# Patient Record
Sex: Female | Born: 1940
Health system: Southern US, Community
[De-identification: ages and names within clinical notes are randomized; demographics above are authoritative.]

## PROBLEM LIST (undated history)

## (undated) DIAGNOSIS — K635 Polyp of colon: Secondary | ICD-10-CM

## (undated) DIAGNOSIS — Z1339 Encounter for screening examination for other mental health and behavioral disorders: Secondary | ICD-10-CM

## (undated) DIAGNOSIS — E663 Overweight: Secondary | ICD-10-CM

## (undated) DIAGNOSIS — M25551 Pain in right hip: Secondary | ICD-10-CM

## (undated) DIAGNOSIS — R1907 Generalized intra-abdominal and pelvic swelling, mass and lump: Secondary | ICD-10-CM

## (undated) DIAGNOSIS — R634 Abnormal weight loss: Secondary | ICD-10-CM

## (undated) DIAGNOSIS — M160 Bilateral primary osteoarthritis of hip: Secondary | ICD-10-CM

## (undated) DIAGNOSIS — E782 Mixed hyperlipidemia: Secondary | ICD-10-CM

## (undated) DIAGNOSIS — Z114 Encounter for screening for human immunodeficiency virus [HIV]: Secondary | ICD-10-CM

## (undated) DIAGNOSIS — R0602 Shortness of breath: Secondary | ICD-10-CM

## (undated) DIAGNOSIS — Z780 Asymptomatic menopausal state: Secondary | ICD-10-CM

## (undated) DIAGNOSIS — M5431 Sciatica, right side: Secondary | ICD-10-CM

## (undated) DIAGNOSIS — Z79899 Other long term (current) drug therapy: Secondary | ICD-10-CM

## (undated) DIAGNOSIS — I6523 Occlusion and stenosis of bilateral carotid arteries: Secondary | ICD-10-CM

## (undated) DIAGNOSIS — J069 Acute upper respiratory infection, unspecified: Secondary | ICD-10-CM

## (undated) DIAGNOSIS — M47816 Spondylosis without myelopathy or radiculopathy, lumbar region: Secondary | ICD-10-CM

## (undated) DIAGNOSIS — R42 Dizziness and giddiness: Secondary | ICD-10-CM

## (undated) DIAGNOSIS — J019 Acute sinusitis, unspecified: Secondary | ICD-10-CM

## (undated) DIAGNOSIS — R3129 Other microscopic hematuria: Secondary | ICD-10-CM

## (undated) DIAGNOSIS — R7989 Other specified abnormal findings of blood chemistry: Secondary | ICD-10-CM

## (undated) DIAGNOSIS — Z2821 Immunization not carried out because of patient refusal: Secondary | ICD-10-CM

## (undated) DIAGNOSIS — Z09 Encounter for follow-up examination after completed treatment for conditions other than malignant neoplasm: Secondary | ICD-10-CM

## (undated) DIAGNOSIS — Z135 Encounter for screening for eye and ear disorders: Secondary | ICD-10-CM

## (undated) DIAGNOSIS — F0390 Unspecified dementia without behavioral disturbance: Secondary | ICD-10-CM

## (undated) DIAGNOSIS — I517 Cardiomegaly: Secondary | ICD-10-CM

## (undated) DIAGNOSIS — Z6822 Body mass index (BMI) 22.0-22.9, adult: Secondary | ICD-10-CM

## (undated) DIAGNOSIS — N390 Urinary tract infection, site not specified: Secondary | ICD-10-CM

## (undated) DIAGNOSIS — M722 Plantar fascial fibromatosis: Secondary | ICD-10-CM

## (undated) DIAGNOSIS — S3210XD Unspecified fracture of sacrum, subsequent encounter for fracture with routine healing: Secondary | ICD-10-CM

## (undated) DIAGNOSIS — R945 Abnormal results of liver function studies: Secondary | ICD-10-CM

## (undated) DIAGNOSIS — E559 Vitamin D deficiency, unspecified: Secondary | ICD-10-CM

## (undated) DIAGNOSIS — Z2829 Immunization not carried out because of patient decision for other reason: Secondary | ICD-10-CM

## (undated) DIAGNOSIS — E781 Pure hyperglyceridemia: Secondary | ICD-10-CM

## (undated) DIAGNOSIS — Z1231 Encounter for screening mammogram for malignant neoplasm of breast: Secondary | ICD-10-CM

## (undated) DIAGNOSIS — R160 Hepatomegaly, not elsewhere classified: Secondary | ICD-10-CM

## (undated) DIAGNOSIS — R9389 Abnormal findings on diagnostic imaging of other specified body structures: Secondary | ICD-10-CM

## (undated) DIAGNOSIS — D539 Nutritional anemia, unspecified: Secondary | ICD-10-CM

## (undated) DIAGNOSIS — Z91199 Patient's noncompliance with other medical treatment and regimen due to unspecified reason: Secondary | ICD-10-CM

## (undated) DIAGNOSIS — Z113 Encounter for screening for infections with a predominantly sexual mode of transmission: Secondary | ICD-10-CM

## (undated) DIAGNOSIS — N281 Cyst of kidney, acquired: Secondary | ICD-10-CM

## (undated) DIAGNOSIS — Z87891 Personal history of nicotine dependence: Secondary | ICD-10-CM

## (undated) DIAGNOSIS — R350 Frequency of micturition: Secondary | ICD-10-CM

## (undated) DIAGNOSIS — M4310 Spondylolisthesis, site unspecified: Secondary | ICD-10-CM

## (undated) DIAGNOSIS — L0390 Cellulitis, unspecified: Secondary | ICD-10-CM

## (undated) DIAGNOSIS — S322XXD Fracture of coccyx, subsequent encounter for fracture with routine healing: Secondary | ICD-10-CM

## (undated) DIAGNOSIS — M545 Low back pain, unspecified: Secondary | ICD-10-CM

## (undated) DIAGNOSIS — G319 Degenerative disease of nervous system, unspecified: Secondary | ICD-10-CM

## (undated) DIAGNOSIS — N183 Chronic kidney disease, stage 3 unspecified: Secondary | ICD-10-CM

## (undated) DIAGNOSIS — Z9119 Patient's noncompliance with other medical treatment and regimen: Secondary | ICD-10-CM

## (undated) DIAGNOSIS — M5116 Intervertebral disc disorders with radiculopathy, lumbar region: Secondary | ICD-10-CM

## (undated) DIAGNOSIS — Z1331 Encounter for screening for depression: Secondary | ICD-10-CM

## (undated) DIAGNOSIS — E612 Magnesium deficiency: Secondary | ICD-10-CM

## (undated) DIAGNOSIS — Z1211 Encounter for screening for malignant neoplasm of colon: Secondary | ICD-10-CM

## (undated) DIAGNOSIS — M129 Arthropathy, unspecified: Secondary | ICD-10-CM

## (undated) DIAGNOSIS — R911 Solitary pulmonary nodule: Secondary | ICD-10-CM

## (undated) DIAGNOSIS — A499 Bacterial infection, unspecified: Secondary | ICD-10-CM

## (undated) DIAGNOSIS — I10 Essential (primary) hypertension: Secondary | ICD-10-CM

## (undated) DIAGNOSIS — K8689 Other specified diseases of pancreas: Secondary | ICD-10-CM

## (undated) DIAGNOSIS — R630 Anorexia: Secondary | ICD-10-CM

## (undated) HISTORY — DX: Intervertebral disc disorders with radiculopathy, lumbar region: M51.16

## (undated) HISTORY — DX: Abnormal results of liver function studies: R94.5

## (undated) HISTORY — DX: Personal history of nicotine dependence: Z87.891

## (undated) HISTORY — DX: Other specified abnormal findings of blood chemistry: R79.89

## (undated) HISTORY — DX: Anorexia: R63.0

## (undated) HISTORY — DX: Spondylolisthesis, site unspecified: M43.10

## (undated) HISTORY — DX: Cyst of kidney, acquired: N28.1

## (undated) HISTORY — DX: Pain in right hip: M25.551

## (undated) HISTORY — DX: Encounter for screening for infections with a predominantly sexual mode of transmission: Z11.3

## (undated) HISTORY — DX: Immunization not carried out because of patient refusal: Z28.21

## (undated) HISTORY — DX: Encounter for screening for human immunodeficiency virus (HIV): Z11.4

## (undated) HISTORY — DX: Encounter for follow-up examination after completed treatment for conditions other than malignant neoplasm: Z09

## (undated) HISTORY — DX: Acute upper respiratory infection, unspecified: J06.9

## (undated) HISTORY — DX: Magnesium deficiency: E61.2

## (undated) HISTORY — DX: Sciatica, right side: M54.31

## (undated) HISTORY — DX: Immunization not carried out because of patient decision for other reason: Z28.29

## (undated) HISTORY — DX: Other long term (current) drug therapy: Z79.899

## (undated) HISTORY — DX: Polyp of colon: K63.5

## (undated) HISTORY — DX: Encounter for screening mammogram for malignant neoplasm of breast: Z12.31

## (undated) HISTORY — DX: Generalized intra-abdominal and pelvic swelling, mass and lump: R19.07

## (undated) HISTORY — DX: Bilateral primary osteoarthritis of hip: M16.0

## (undated) HISTORY — DX: Frequency of micturition: R35.0

## (undated) HISTORY — DX: Acute sinusitis, unspecified: J01.90

## (undated) HISTORY — DX: Abnormal weight loss: R63.4

## (undated) HISTORY — DX: Other microscopic hematuria: R31.29

## (undated) HISTORY — DX: Nutritional anemia, unspecified: D53.9

## (undated) HISTORY — DX: Pure hyperglyceridemia: E78.1

## (undated) HISTORY — DX: Plantar fascial fibromatosis: M72.2

## (undated) HISTORY — DX: Cardiomegaly: I51.7

## (undated) HISTORY — DX: Bacterial infection, unspecified: N39.0

## (undated) HISTORY — DX: Cellulitis, unspecified: L03.90

## (undated) HISTORY — DX: Other specified diseases of pancreas: K86.89

## (undated) HISTORY — DX: Vitamin D deficiency, unspecified: E55.9

## (undated) HISTORY — DX: Patient's noncompliance with other medical treatment and regimen: Z91.19

## (undated) HISTORY — DX: Spondylosis without myelopathy or radiculopathy, lumbar region: M47.816

## (undated) HISTORY — DX: Solitary pulmonary nodule: R91.1

## (undated) HISTORY — DX: Encounter for screening for malignant neoplasm of colon: Z12.11

## (undated) HISTORY — DX: Low back pain, unspecified: M54.50

## (undated) HISTORY — DX: Abnormal findings on diagnostic imaging of other specified body structures: R93.89

## (undated) HISTORY — DX: Asymptomatic menopausal state: Z78.0

## (undated) HISTORY — DX: Encounter for screening for eye and ear disorders: Z13.5

## (undated) HISTORY — DX: Chronic kidney disease, stage 3 unspecified: N18.30

## (undated) HISTORY — DX: Occlusion and stenosis of bilateral carotid arteries: I65.23

## (undated) HISTORY — DX: Dizziness and giddiness: R42

## (undated) HISTORY — DX: Shortness of breath: R06.02

## (undated) HISTORY — DX: Hepatomegaly, not elsewhere classified: R16.0

## (undated) HISTORY — DX: Fracture of coccyx, subsequent encounter for fracture with routine healing: S32.2XXD

## (undated) HISTORY — DX: Overweight: E66.3

## (undated) HISTORY — DX: Encounter for screening for depression: Z13.31

## (undated) HISTORY — DX: Unspecified fracture of sacrum, subsequent encounter for fracture with routine healing: S32.10XD

## (undated) HISTORY — DX: Arthropathy, unspecified: M12.9

## (undated) HISTORY — DX: Degenerative disease of nervous system, unspecified: G31.9

## (undated) HISTORY — DX: Body mass index (BMI) 22.0-22.9, adult: Z68.22

## (undated) HISTORY — DX: Unspecified dementia, unspecified severity, without behavioral disturbance, psychotic disturbance, mood disturbance, and anxiety: F03.90

## (undated) HISTORY — DX: Mixed hyperlipidemia: E78.2

## (undated) HISTORY — DX: Encounter for screening examination for other mental health and behavioral disorders: Z13.39

## (undated) HISTORY — DX: Bacterial infection, unspecified: A49.9

## (undated) HISTORY — DX: Patient's noncompliance with other medical treatment and regimen due to unspecified reason: Z91.199

---

## 2013-11-07 ENCOUNTER — Encounter: Payer: Self-pay | Admitting: Podiatry

## 2013-11-07 ENCOUNTER — Ambulatory Visit (INDEPENDENT_AMBULATORY_CARE_PROVIDER_SITE_OTHER): Payer: Medicare Other | Admitting: Podiatry

## 2013-11-07 VITALS — BP 199/104 | HR 94 | Ht 66.0 in | Wt 171.0 lb

## 2013-11-07 DIAGNOSIS — M79609 Pain in unspecified limb: Secondary | ICD-10-CM

## 2013-11-07 DIAGNOSIS — M79674 Pain in right toe(s): Secondary | ICD-10-CM

## 2013-11-07 DIAGNOSIS — R609 Edema, unspecified: Secondary | ICD-10-CM

## 2013-11-07 DIAGNOSIS — R600 Localized edema: Secondary | ICD-10-CM | POA: Insufficient documentation

## 2013-11-07 DIAGNOSIS — S99921A Unspecified injury of right foot, initial encounter: Secondary | ICD-10-CM

## 2013-11-07 DIAGNOSIS — S8990XA Unspecified injury of unspecified lower leg, initial encounter: Secondary | ICD-10-CM

## 2013-11-07 NOTE — Progress Notes (Signed)
Subjective: 72 year old presents with painful toe 2nd right. Stated that she hit the toe on a table while walking in dark at home, which happened on December 3rd. The toe is still painful and swollen.   Objective: Swollen 2nd digit right. 2nd digit sits on top of right hallux. HAV with bunion bilateral. Right foot has old incision line over the 1st and 2nd MPJ area. Neurovascular status are within normal. X-ray show no broken bones on the 2nd digit right foot. Has severe hallux valgus with deviated hallux.   Assessment: Injured 2nd digit right with pain and edema. Severe Hallux valgus with bunion bilateral.  Plan: Reviewed clinical findings and available options. Patient is to stay in comfortable shoes for the next 3-4 weeks. Return as needed.

## 2013-11-07 NOTE — Patient Instructions (Addendum)
Seen for pain in right 2nd toe following an injury. X-ray taken.  Noted of no broken bones at the affected toe. Stay in comfortable shoes for the next 3-4 weeks. Return as needed.

## 2014-11-17 DIAGNOSIS — Z9889 Other specified postprocedural states: Secondary | ICD-10-CM

## 2014-11-17 HISTORY — DX: Other specified postprocedural states: Z98.890

## 2017-11-17 DIAGNOSIS — Z9289 Personal history of other medical treatment: Secondary | ICD-10-CM

## 2017-11-17 HISTORY — DX: Personal history of other medical treatment: Z92.89

## 2018-01-07 ENCOUNTER — Emergency Department (HOSPITAL_BASED_OUTPATIENT_CLINIC_OR_DEPARTMENT_OTHER): Payer: Medicare Other

## 2018-01-07 ENCOUNTER — Encounter (HOSPITAL_BASED_OUTPATIENT_CLINIC_OR_DEPARTMENT_OTHER): Payer: Self-pay | Admitting: Emergency Medicine

## 2018-01-07 ENCOUNTER — Emergency Department (HOSPITAL_BASED_OUTPATIENT_CLINIC_OR_DEPARTMENT_OTHER)
Admission: EM | Admit: 2018-01-07 | Discharge: 2018-01-07 | Disposition: A | Discharge status: Home / Self Care | Payer: Medicare Other | Attending: Emergency Medicine | Admitting: Emergency Medicine

## 2018-01-07 ENCOUNTER — Other Ambulatory Visit: Payer: Self-pay

## 2018-01-07 DIAGNOSIS — Y92009 Unspecified place in unspecified non-institutional (private) residence as the place of occurrence of the external cause: Secondary | ICD-10-CM | POA: Insufficient documentation

## 2018-01-07 DIAGNOSIS — Z79899 Other long term (current) drug therapy: Secondary | ICD-10-CM | POA: Insufficient documentation

## 2018-01-07 DIAGNOSIS — W1830XA Fall on same level, unspecified, initial encounter: Secondary | ICD-10-CM | POA: Insufficient documentation

## 2018-01-07 DIAGNOSIS — Y999 Unspecified external cause status: Secondary | ICD-10-CM | POA: Diagnosis not present

## 2018-01-07 DIAGNOSIS — Y939 Activity, unspecified: Secondary | ICD-10-CM | POA: Diagnosis not present

## 2018-01-07 DIAGNOSIS — I10 Essential (primary) hypertension: Secondary | ICD-10-CM | POA: Diagnosis not present

## 2018-01-07 DIAGNOSIS — R4182 Altered mental status, unspecified: Secondary | ICD-10-CM | POA: Insufficient documentation

## 2018-01-07 DIAGNOSIS — Z87891 Personal history of nicotine dependence: Secondary | ICD-10-CM | POA: Diagnosis not present

## 2018-01-07 DIAGNOSIS — N3000 Acute cystitis without hematuria: Secondary | ICD-10-CM | POA: Insufficient documentation

## 2018-01-07 DIAGNOSIS — S3992XA Unspecified injury of lower back, initial encounter: Secondary | ICD-10-CM | POA: Diagnosis present

## 2018-01-07 DIAGNOSIS — S3210XA Unspecified fracture of sacrum, initial encounter for closed fracture: Secondary | ICD-10-CM | POA: Insufficient documentation

## 2018-01-07 DIAGNOSIS — R911 Solitary pulmonary nodule: Secondary | ICD-10-CM | POA: Diagnosis not present

## 2018-01-07 DIAGNOSIS — F0390 Unspecified dementia without behavioral disturbance: Secondary | ICD-10-CM | POA: Insufficient documentation

## 2018-01-07 HISTORY — DX: Unspecified dementia, unspecified severity, without behavioral disturbance, psychotic disturbance, mood disturbance, and anxiety: F03.90

## 2018-01-07 HISTORY — DX: Essential (primary) hypertension: I10

## 2018-01-07 LAB — COMPREHENSIVE METABOLIC PANEL
ALBUMIN: 4.4 g/dL (ref 3.5–5.0)
ALT: 12 U/L — AB (ref 14–54)
AST: 28 U/L (ref 15–41)
Alkaline Phosphatase: 59 U/L (ref 38–126)
Anion gap: 13 (ref 5–15)
BUN: 29 mg/dL — AB (ref 6–20)
CHLORIDE: 96 mmol/L — AB (ref 101–111)
CO2: 28 mmol/L (ref 22–32)
CREATININE: 1.28 mg/dL — AB (ref 0.44–1.00)
Calcium: 10 mg/dL (ref 8.9–10.3)
GFR calc Af Amer: 46 mL/min — ABNORMAL LOW (ref >60)
GFR, EST NON AFRICAN AMERICAN: 39 mL/min — AB (ref >60)
Glucose, Bld: 105 mg/dL — ABNORMAL HIGH (ref 65–99)
Potassium: 3.4 mmol/L — ABNORMAL LOW (ref 3.5–5.1)
SODIUM: 137 mmol/L (ref 135–145)
Total Bilirubin: 1.1 mg/dL (ref 0.3–1.2)
Total Protein: 7.8 g/dL (ref 6.5–8.1)

## 2018-01-07 LAB — CBC
HEMATOCRIT: 38.5 % (ref 36.0–46.0)
Hemoglobin: 13.6 g/dL (ref 12.0–15.0)
MCH: 32.6 pg (ref 26.0–34.0)
MCHC: 35.3 g/dL (ref 30.0–36.0)
MCV: 92.3 fL (ref 78.0–100.0)
Platelets: 264 10*3/uL (ref 150–400)
RBC: 4.17 MIL/uL (ref 3.87–5.11)
RDW: 12.8 % (ref 11.5–15.5)
WBC: 11.2 10*3/uL — AB (ref 4.0–10.5)

## 2018-01-07 LAB — URINALYSIS, ROUTINE W REFLEX MICROSCOPIC
BILIRUBIN URINE: NEGATIVE
GLUCOSE, UA: NEGATIVE mg/dL
KETONES UR: 15 mg/dL — AB
Nitrite: POSITIVE — AB
PH: 6 (ref 5.0–8.0)
PROTEIN: NEGATIVE mg/dL
Specific Gravity, Urine: 1.025 (ref 1.005–1.030)

## 2018-01-07 LAB — URINALYSIS, MICROSCOPIC (REFLEX)

## 2018-01-07 LAB — CBG MONITORING, ED: Glucose-Capillary: 121 mg/dL — ABNORMAL HIGH (ref 65–99)

## 2018-01-07 MED ORDER — SODIUM CHLORIDE 0.9 % IV SOLN
1.0000 g | Freq: Once | INTRAVENOUS | Status: AC
  Administered 2018-01-07: 1 g via INTRAVENOUS
  Filled 2018-01-07: qty 10

## 2018-01-07 MED ORDER — CEPHALEXIN 500 MG PO CAPS: 500.0000 mg | ORAL_CAPSULE | Freq: Two times a day (BID) | ORAL | 0 refills | Status: DC

## 2018-01-07 MED ORDER — SODIUM CHLORIDE 0.9 % IV BOLUS (SEPSIS)
500.0000 mL | Freq: Once | INTRAVENOUS | Status: AC
  Administered 2018-01-07: 500 mL via INTRAVENOUS

## 2018-01-07 NOTE — Discharge Instructions (Signed)
You have a subtle crack in your tailbone which may be a new versus old fracture.  This should heal on its own without intervention.  You have evidence of urinary tract infection and need to take all of your antibiotics.  You have a small nodule in your lung that needs to be followed periodically by her primary care physician.  You have a mild elevation in your creatinine (kidney function) which needs to be followed by your primary care physician.  Return to the emergency department if you have any worsening symptoms including worsening confusion, fevers, vomiting or other worsening symptoms.

## 2018-01-07 NOTE — ED Provider Notes (Signed)
[facility] EMERGENCY DEPARTMENT Provider Note   CSN: [id number] Arrival date & time: 01/07/18  1647     History   Chief Complaint Chief Complaint  Patient presents with  . Weakness    HPI Christina Perkins is a 77 y.o. female.  Patient is a 77 year old female with a history of hypertension and mild dementia who presents with change in mental status.  Her son who brings her in states that over the last several months she has had what seems to be some worsening dementia.  They recently took her to her PCP to have tests for dementia but they have not heard results.  He states that over the last month and a half she had a decreased appetite.  They have had to keep encouraging her to eat.  She is also been a little bit more unsteady and has had at least 2 recent falls.  The patient states that she fell today and she does not really know what made her fall.  She landed on her buttocks and has some pain to her lower back which is chronic but a little bit worse than her normal pain.  She denies any chest pain or shortness of breath.  No recent illnesses.  No fevers.  No cough or cold symptoms.  No nausea vomiting or diarrhea.  No reported urinary symptoms.  The son states that she is been more confused than she normally is.  She will do things like wash her face and then go back and see if she needs to wash her face again not remembering that she had just washed her face.  She does live alone but has family members that visit frequently.      Past Medical History:  Diagnosis Date  . Dementia   . Hypertension     Patient Active Problem List   Diagnosis Date Noted  . Injury of toe on right foot 11/07/2013  . Edema of toe 11/07/2013  . Pain in toe of right foot 11/07/2013    History reviewed. No pertinent surgical history.  OB History    No data available       Home Medications    Prior to Admission medications   Medication Sig Start Date End Date Taking? Authorizing  Provider  BLACK COHOSH PO Take by mouth 2 (two) times daily.    [provider]  cephALEXin (KEFLEX) 500 MG capsule Take 1 capsule (500 mg total) by mouth 2 (two) times daily. 01/07/18   Rolan BuccoBelfi, Karman Veney, MD    Family History History reviewed. No pertinent family history.  Social History Social History   Tobacco Use  . Smoking status: Former Games developermoker  . Smokeless tobacco: Never Used  Substance Use Topics  . Alcohol use: No    Frequency: Never  . Drug use: No     Allergies   Patient has no known allergies.   Review of Systems Review of Systems  Constitutional: Negative for chills, diaphoresis, fatigue and fever.  HENT: Negative for congestion, rhinorrhea and sneezing.   Eyes: Negative.   Respiratory: Negative for cough, chest tightness and shortness of breath.   Cardiovascular: Negative for chest pain and leg swelling.  Gastrointestinal: Negative for abdominal pain, blood in stool, diarrhea, nausea and vomiting.  Genitourinary: Negative for difficulty urinating, flank pain, frequency and hematuria.  Musculoskeletal: Positive for back pain. Negative for arthralgias.  Skin: Negative for rash.  Neurological: Negative for dizziness, speech difficulty, weakness, numbness and headaches.     Physical  Exam Updated Vital Signs BP 127/73   Pulse 75   Temp 98.9 F (37.2 C) (Oral)   Resp 14   Wt 57.6 kg (126 lb 15.8 oz)   SpO2 100%   BMI 20.50 kg/m   Physical Exam  Constitutional: She appears well-developed and well-nourished.  HENT:  Head: Normocephalic and atraumatic.  Eyes: Pupils are equal, round, and reactive to light.  Neck: Normal range of motion. Neck supple.  Cardiovascular: Normal rate, regular rhythm and normal heart sounds.  Pulmonary/Chest: Effort normal and breath sounds normal. No respiratory distress. She has no wheezes. She has no rales. She exhibits no tenderness.  Abdominal: Soft. Bowel sounds are normal. There is no tenderness. There is no  rebound and no guarding.  Musculoskeletal: Normal range of motion. She exhibits no edema.  There is some tenderness to the lower lumbar spine.  There is no pain of the thoracic or cervical spine.  No pain on palpation or range of motion of the extremities.  No step-offs or deformities are noted to the spine.  Lymphadenopathy:    She has no cervical adenopathy.  Neurological: She is alert.  Patient is alert to person and place.  She does not know the date or the month.  She is moving all extremities symmetrically without any focal deficits.  There is no facial drooping.  Skin: Skin is warm and dry. No rash noted.  Psychiatric: She has a normal mood and affect.     ED Treatments / Results  Labs (all labs ordered are listed, but only abnormal results are displayed) Labs Reviewed  CBC - Abnormal; Notable for the following components:      Result Value   WBC 11.2 (*)    All other components within normal limits  URINALYSIS, ROUTINE W REFLEX MICROSCOPIC - Abnormal; Notable for the following components:   APPearance CLOUDY (*)    Hgb urine dipstick TRACE (*)    Ketones, ur 15 (*)    Nitrite POSITIVE (*)    Leukocytes, UA MODERATE (*)    All other components within normal limits  URINALYSIS, MICROSCOPIC (REFLEX) - Abnormal; Notable for the following components:   Bacteria, UA MANY (*)    Squamous Epithelial / LPF 0-5 (*)    All other components within normal limits  COMPREHENSIVE METABOLIC PANEL - Abnormal; Notable for the following components:   Potassium 3.4 (*)    Chloride 96 (*)    Glucose, Bld 105 (*)    BUN 29 (*)    Creatinine, Ser 1.28 (*)    ALT 12 (*)    GFR calc non Af Amer 39 (*)    GFR calc Af Amer 46 (*)    All other components within normal limits  CBG MONITORING, ED - Abnormal; Notable for the following components:   Glucose-Capillary 121 (*)    All other components within normal limits  URINE CULTURE    EKG  EKG Interpretation  Date/Time:  Thursday January 07 2018 18:15:30 EST Ventricular Rate:  86 PR Interval:    QRS Duration: 76 QT Interval:  387 QTC Calculation: 463 R Axis:   69 Text Interpretation:  Sinus rhythm Consider right atrial enlargement Baseline wander in lead(s) V6 No old tracing to compare Confirmed by Rolan Bucco [phone]) on 01/07/2018 7:18:51 PM       Radiology Dg Chest 2 View  Result Date: 01/07/2018 CLINICAL DATA:  Weakness and dizziness 1 month.  Frequent falls. EXAM: CHEST  2 VIEW COMPARISON:  None.  FINDINGS: Lungs are hyperexpanded without focal airspace consolidation, effusion or pneumothorax. There is mild biapical pleural thickening left greater than right with surgical suture line over the left apex. 3-4 mm nodule over the left apex. Cardiomediastinal silhouette is normal. Bones and soft tissues are unremarkable. IMPRESSION: No acute cardiopulmonary disease. Postsurgical change over the left apex with mild biapical pleural thickening left greater than right. 3-4 mm nodular density over the left apex. Recommend correlation with prior radiographs if available versus follow-up noncontrast chest CT on an elective basis for further evaluation. Electronically Signed   By: Elberta Fortis M.D.   On: 01/07/2018 19:01   Dg Lumbar Spine Complete  Result Date: 01/07/2018 CLINICAL DATA:  Weakness and dizziness 1 month.  Frequent falls. EXAM: LUMBAR SPINE - COMPLETE 4+ VIEW COMPARISON:  None. FINDINGS: Six non rib-bearing lumbar vertebrae. There is mild spondylosis of the lumbar spine to include facet arthropathy. No evidence of compression fracture. Moderate disc space narrowing at the L5-L6 level and L6-S1 level and to a minimal degree at the L4-5 level. Mild grade 1 anterolisthesis of L5 on L6 due to facet arthropathy. Suggestion of fracture of the sacrococcygeal junction with exaggerated kyphosis which may be acute or chronic. IMPRESSION: Suggesting a fracture of the sacrococcygeal junction with exaggerated kyphosis as this may be  acute or chronic. Six non rib-bearing lumbar vertebrae. Mild spondylosis of the lumbar spine with multilevel disc disease as described. Grade 1 anterolisthesis of L5 on L6. Electronically Signed   By: Elberta Fortis M.D.   On: 01/07/2018 19:10   Ct Head Wo Contrast  Result Date: 01/07/2018 CLINICAL DATA:  Frequent falls, dizzy EXAM: CT HEAD WITHOUT CONTRAST TECHNIQUE: Contiguous axial images were obtained from the base of the skull through the vertex without intravenous contrast. COMPARISON:  None. FINDINGS: Brain: No acute territorial infarction, hemorrhage or intracranial mass is visualized. Mild cortical atrophy. Ventricle size within normal limits. Vascular: No hyperdense vessel or unexpected calcification. Skull: Normal. Negative for fracture or focal lesion. Sinuses/Orbits: No acute finding. Other: None IMPRESSION: Negative. No CT evidence for acute intracranial abnormality. Mild atrophy Electronically Signed   By: Jasmine Pang M.D.   On: 01/07/2018 19:03    Procedures Procedures (including critical care time)  Medications Ordered in ED Medications  sodium chloride 0.9 % bolus 500 mL (500 mLs Intravenous New Bag/Given 01/07/18 1925)  cefTRIAXone (ROCEPHIN) 1 g in sodium chloride 0.9 % 100 mL IVPB (1 g Intravenous New Bag/Given 01/07/18 1925)     Initial Impression / Assessment and Plan / ED Course  I have reviewed the triage vital signs and the nursing notes.  Pertinent labs & imaging results that were available during my care of the patient were reviewed by me and considered in my medical decision making (see chart for details).     Patient is a 77 year old female who presents with some increased confusion and decreased appetite over the last month and a half.  She has no focal neurologic deficits.  No fever or other signs of infection other than she does have evidence of a urinary tract infection.  Her labs show mild elevation in her creatinine which may be partly from dehydration.  She  has a slightly low potassium but otherwise her labs are non-concerning.  She has no evidence of pneumonia on chest x-ray.  She does have a small pulmonary nodule which I did discuss with the family that will need periodic follow-up by her PCP.  Her head CT is negative for acute  abnormality.  No signs of a stroke or intracranial hemorrhage.  She had x-rays of her lumbar spine which show a new versus old fracture through the sacrococcyx junction.  She has no neurologic deficits.  She was discharged home in good condition.  She was started on Keflex for her UTI.  She was given a dose of Rocephin in the ED.  Her urine was sent for culture.  I discussed her findings with her family.  I did advise him that she will need follow-up regarding the elevated creatinine and a pulmonary nodule.  They will arrange for her to be seen by her PCP next week.  Return precautions were given.  Final Clinical Impressions(s) / ED Diagnoses   Final diagnoses:  Closed fracture of sacrum, unspecified portion of sacrum, initial encounter [facility])  Pulmonary nodule  Acute cystitis without hematuria    ED Discharge Orders        Ordered    cephALEXin (KEFLEX) 500 MG capsule  2 times daily     01/07/18 2023       Rolan Bucco, MD 01/07/18 2026

## 2018-01-07 NOTE — ED Triage Notes (Signed)
Patient states that she has had a had dizzy feeling and weakness all over for about 1 month  - patient is having frequent falls at home - the patient also is not eating at home - family is concerned

## 2018-01-07 NOTE — ED Notes (Signed)
Pt ambulated to the restroom with assistance. Noted unsteady gate on ambulation.

## 2018-01-10 LAB — URINE CULTURE: Culture: >=100000 — AB

## 2019-04-19 LAB — LIPID PANEL
Cholesterol: 169 (ref 0–200)
HDL: 75 — AB (ref 35–70)
LDL Cholesterol: 73
Triglycerides: 106 (ref 40–160)

## 2019-04-19 LAB — CBC AND DIFFERENTIAL
HCT: 41 (ref 36–46)
Hemoglobin: 13.4 (ref 12.0–16.0)
Platelets: 278 (ref 150–399)
WBC: 9.3

## 2019-04-19 LAB — BASIC METABOLIC PANEL
BUN: 17 (ref 4–21)
CO2: 27 — AB (ref 13–22)
Creatinine: 1.2 — AB (ref 0.5–1.1)
Glucose: 97

## 2019-04-19 LAB — HEPATIC FUNCTION PANEL
ALT: 45 — AB (ref 7–35)
AST: 34 (ref 13–35)

## 2019-04-19 LAB — CBC: RBC: 4.26 (ref 3.87–5.11)

## 2019-04-19 LAB — COMPREHENSIVE METABOLIC PANEL: Globulin: 2.5

## 2019-07-27 LAB — COMPREHENSIVE METABOLIC PANEL
Albumin: 4.3 (ref 3.5–5.0)
Globulin: 2.2

## 2019-07-27 LAB — CBC AND DIFFERENTIAL
HCT: 42 (ref 36–46)
Hemoglobin: 13.6 (ref 12.0–16.0)
Platelets: 223 (ref 150–399)
WBC: 9.6

## 2019-07-27 LAB — HEPATIC FUNCTION PANEL
ALT: 21 (ref 7–35)
AST: 19 (ref 13–35)

## 2019-07-27 LAB — BASIC METABOLIC PANEL
BUN: 14 (ref 4–21)
CO2: 25 — AB (ref 13–22)
Creatinine: 1.2 — AB (ref 0.5–1.1)
Glucose: 99

## 2019-07-27 LAB — CBC: RBC: 4.44 (ref 3.87–5.11)

## 2019-11-02 LAB — CBC AND DIFFERENTIAL
HCT: 47 — AB (ref 36–46)
Hemoglobin: 15.1 (ref 12.0–16.0)
Platelets: 354 (ref 150–399)
WBC: 8.2

## 2019-11-02 LAB — BASIC METABOLIC PANEL
BUN: 22 — AB (ref 4–21)
CO2: 30 — AB (ref 13–22)
Creatinine: 1.3 — AB (ref 0.5–1.1)
Glucose: 92

## 2019-11-02 LAB — COMPREHENSIVE METABOLIC PANEL
Albumin: 4.6 (ref 3.5–5.0)
GFR calc Af Amer: 48
GFR calc non Af Amer: 39
Globulin: 2.7

## 2019-11-02 LAB — CBC: RBC: 4.96 (ref 3.87–5.11)

## 2019-11-02 LAB — HEPATIC FUNCTION PANEL
ALT: 14 (ref 7–35)
AST: 12 — AB (ref 13–35)

## 2020-02-08 LAB — HEPATIC FUNCTION PANEL
ALT: 11 (ref 7–35)
AST: 14 (ref 13–35)

## 2020-02-08 LAB — CBC AND DIFFERENTIAL
HCT: 44 (ref 36–46)
Hemoglobin: 14.4 (ref 12.0–16.0)
Platelets: 329 (ref 150–399)
WBC: 9.1

## 2020-02-08 LAB — BASIC METABOLIC PANEL
BUN: 28 — AB (ref 4–21)
CO2: 29 — AB (ref 13–22)
Creatinine: 1.3 — AB (ref 0.5–1.1)
Glucose: 105

## 2020-02-08 LAB — CBC: RBC: 4.74 (ref 3.87–5.11)

## 2020-02-08 LAB — LIPID PANEL
Cholesterol: 180 (ref 0–200)
HDL: 60 (ref 35–70)
LDL Cholesterol: 85
Triglycerides: 173 — AB (ref 40–160)

## 2020-02-08 LAB — COMPREHENSIVE METABOLIC PANEL
Albumin: 4.4 (ref 3.5–5.0)
GFR calc Af Amer: 48
GFR calc non Af Amer: 39
Globulin: 2.4

## 2020-06-14 LAB — LIPID PANEL
Cholesterol: 147 (ref 0–200)
HDL: 71 — AB (ref 35–70)
LDL Cholesterol: 53
Triglycerides: 117 (ref 40–160)

## 2020-06-14 LAB — HEPATIC FUNCTION PANEL
ALT: 13 (ref 7–35)
AST: 22 (ref 13–35)

## 2020-06-14 LAB — CBC AND DIFFERENTIAL
HCT: 43 (ref 36–46)
Hemoglobin: 13.2 (ref 12.0–16.0)
Platelets: 201 (ref 150–399)
WBC: 8.5

## 2020-06-14 LAB — COMPREHENSIVE METABOLIC PANEL
Albumin: 4.1 (ref 3.5–5.0)
GFR calc Af Amer: 65
GFR calc non Af Amer: 53
Globulin: 2.4

## 2020-06-14 LAB — BASIC METABOLIC PANEL
BUN: 11 (ref 4–21)
CO2: 27 — AB (ref 13–22)
Creatinine: 1 (ref 0.5–1.1)

## 2020-06-14 LAB — CBC: RBC: 4.27 (ref 3.87–5.11)

## 2020-07-03 ENCOUNTER — Encounter: Payer: Self-pay | Admitting: Family

## 2020-07-03 ENCOUNTER — Ambulatory Visit (INDEPENDENT_AMBULATORY_CARE_PROVIDER_SITE_OTHER): Payer: Medicare Other | Admitting: Family

## 2020-07-03 ENCOUNTER — Other Ambulatory Visit: Payer: Self-pay

## 2020-07-03 VITALS — BP 140/100 | HR 77 | Temp 97.1°F | Resp 16 | Ht 66.0 in | Wt 142.0 lb

## 2020-07-03 DIAGNOSIS — R634 Abnormal weight loss: Secondary | ICD-10-CM | POA: Diagnosis not present

## 2020-07-03 DIAGNOSIS — I10 Essential (primary) hypertension: Secondary | ICD-10-CM

## 2020-07-03 DIAGNOSIS — Z7289 Other problems related to lifestyle: Secondary | ICD-10-CM

## 2020-07-03 DIAGNOSIS — Z780 Asymptomatic menopausal state: Secondary | ICD-10-CM

## 2020-07-03 DIAGNOSIS — M25551 Pain in right hip: Secondary | ICD-10-CM

## 2020-07-03 DIAGNOSIS — R630 Anorexia: Secondary | ICD-10-CM | POA: Diagnosis not present

## 2020-07-03 DIAGNOSIS — G301 Alzheimer's disease with late onset: Secondary | ICD-10-CM

## 2020-07-03 DIAGNOSIS — F0280 Dementia in other diseases classified elsewhere without behavioral disturbance: Secondary | ICD-10-CM

## 2020-07-03 DIAGNOSIS — L602 Onychogryphosis: Secondary | ICD-10-CM

## 2020-07-03 DIAGNOSIS — E785 Hyperlipidemia, unspecified: Secondary | ICD-10-CM | POA: Diagnosis not present

## 2020-07-03 DIAGNOSIS — Z789 Other specified health status: Secondary | ICD-10-CM

## 2020-07-03 DIAGNOSIS — Z23 Encounter for immunization: Secondary | ICD-10-CM

## 2020-07-03 DIAGNOSIS — R2681 Unsteadiness on feet: Secondary | ICD-10-CM

## 2020-07-03 DIAGNOSIS — G8929 Other chronic pain: Secondary | ICD-10-CM

## 2020-07-03 LAB — COMPLETE METABOLIC PANEL WITH GFR
AG Ratio: 2 (calc) (ref 1.0–2.5)
ALT: 9 U/L (ref 6–29)
AST: 12 U/L (ref 10–35)
Albumin: 4.4 g/dL (ref 3.6–5.1)
Alkaline phosphatase (APISO): 76 U/L (ref 37–153)
BUN/Creatinine Ratio: 15 (calc) (ref 6–22)
BUN: 16 mg/dL (ref 7–25)
CO2: 28 mmol/L (ref 20–32)
Calcium: 9.9 mg/dL (ref 8.6–10.4)
Chloride: 103 mmol/L (ref 98–110)
Creat: 1.07 mg/dL — ABNORMAL HIGH (ref 0.60–0.88)
GFR, Est African American: 57 mL/min/{1.73_m2} — ABNORMAL LOW (ref >=60)
GFR, Est Non African American: 49 mL/min/{1.73_m2} — ABNORMAL LOW (ref >=60)
Globulin: 2.2 g/dL (calc) (ref 1.9–3.7)
Glucose, Bld: 92 mg/dL (ref 65–99)
Potassium: 3.8 mmol/L (ref 3.5–5.3)
Sodium: 142 mmol/L (ref 135–146)
Total Bilirubin: 0.6 mg/dL (ref 0.2–1.2)
Total Protein: 6.6 g/dL (ref 6.1–8.1)

## 2020-07-03 LAB — CBC WITH DIFFERENTIAL/PLATELET
Absolute Monocytes: 474 cells/uL (ref 200–950)
Basophils Absolute: 28 cells/uL (ref 0–200)
Basophils Relative: 0.3 %
Eosinophils Absolute: 56 cells/uL (ref 15–500)
Eosinophils Relative: 0.6 %
HCT: 43.2 % (ref 35.0–45.0)
Hemoglobin: 14.5 g/dL (ref 11.7–15.5)
Lymphs Abs: 2009 cells/uL (ref 850–3900)
MCH: 30.9 pg (ref 27.0–33.0)
MCHC: 33.6 g/dL (ref 32.0–36.0)
MCV: 91.9 fL (ref 80.0–100.0)
MPV: 10.1 fL (ref 7.5–12.5)
Monocytes Relative: 5.1 %
Neutro Abs: 6733 cells/uL (ref 1500–7800)
Neutrophils Relative %: 72.4 %
Platelets: 291 10*3/uL (ref 140–400)
RBC: 4.7 10*6/uL (ref 3.80–5.10)
RDW: 12.1 % (ref 11.0–15.0)
Total Lymphocyte: 21.6 %
WBC: 9.3 10*3/uL (ref 3.8–10.8)

## 2020-07-03 LAB — TSH: TSH: 0.56 mIU/L (ref 0.40–4.50)

## 2020-07-03 LAB — LIPID PANEL
Cholesterol: 159 mg/dL (ref <200)
HDL: 45 mg/dL — ABNORMAL LOW (ref >=50)
LDL Cholesterol (Calc): 88 mg/dL (calc)
Non-HDL Cholesterol (Calc): 114 mg/dL (calc) (ref <130)
Total CHOL/HDL Ratio: 3.5 (calc) (ref <5.0)
Triglycerides: 162 mg/dL — ABNORMAL HIGH (ref <150)

## 2020-07-03 LAB — VITAMIN B12: Vitamin B-12: 709 pg/mL (ref 200–1100)

## 2020-07-03 MED ORDER — TETANUS-DIPHTH-ACELL PERTUSSIS 5-2.5-18.5 LF-MCG/0.5 IM SUSP: 0.5000 mL | Freq: Once | INTRAMUSCULAR | 0 refills | Status: AC

## 2020-07-03 NOTE — Progress Notes (Signed)
Provider: Marlowe Sax FNP-C   Shareeka Yim, Nelda Bucks, NP  Patient Care Team: Zaul Hubers, Nelda Bucks, NP as PCP - General (Family Medicine)  Extended Emergency Contact Information Primary Emergency Contact: Lang Snow [place] Phone: [phone] Relation: Daughter Secondary Emergency Contact: Westley Hummer [place] Phone: [phone] Relation: Daughter  Code Status:  Full code  Goals of care: Advanced Directive information Advanced Directives 07/18/2020  Does Patient Have a Medical Advance Directive? No  Type of Paramedic of [place];Living will  Does patient want to make changes to medical advance directive? No - Patient declined  Copy of [place] in Chart? No - copy requested  Would patient like information on creating a medical advance directive? No - Patient declined     Chief Complaint  Patient presents with  . Establish Care    New Patient.    HPI:  Pt is a 79 y.o. female seen today to establish care for medical management of chronic diseases.she is here with her daughter Joseph Art and the son on the phone.she has a medical history of Hypertension,hyperlipidemia,dementia,right hip pain among others. Son states need an Environmental consultant to assist her with her shower.patient has issues with her balance.No recent fall episode.Has had weight loss and poor oral intake.she drinks 6 glasses of wine per week.Has had worsening memory issues requires assistance with ADL's. Would like referral to Neuro.On Aricept  10 mg tablet at bedtime.No behavioral issues reported.  She is due to Tdap vaccine and Bone density.   Past Medical History:  Diagnosis Date  . Dementia ([place])   . H/O mammogram 2019   Per [facility] new patient packet  . Hx of colonoscopy 2016   Per [facility] new patient packet  . Hypertension    History reviewed. No pertinent surgical history.  No Known Allergies  Allergies as of 07/03/2020   No Known Allergies       Medication List       Accurate as of July 03, 2020 11:59 PM. If you have any questions, ask your nurse or doctor.        STOP taking these medications   BLACK COHOSH PO Stopped by: Nelda Bucks Tajae Maiolo, NP   cephALEXin 500 MG capsule Commonly known as: KEFLEX Stopped by: Sandrea Hughs, NP     TAKE these medications   atorvastatin 20 MG tablet Commonly known as: LIPITOR Take 20 mg by mouth at bedtime.   cholecalciferol 25 MCG (1000 UNIT) tablet Commonly known as: VITAMIN D3 Take 1,000 Units by mouth daily.   donepezil 10 MG tablet Commonly known as: ARICEPT Take 10 mg by mouth at bedtime. Before Bedtime.   gabapentin 100 MG capsule Commonly known as: NEURONTIN Take 100 mg by mouth 2 (two) times daily.   hydrochlorothiazide 25 MG tablet Commonly known as: HYDRODIURIL Take 25 mg by mouth daily.   mirtazapine 15 MG tablet Commonly known as: REMERON Take 15 mg by mouth at bedtime.   sertraline 25 MG tablet Commonly known as: ZOLOFT Take 25 mg by mouth at bedtime.   Tdap 5-2.5-18.5 LF-MCG/0.5 injection Commonly known as: BOOSTRIX Inject 0.5 mLs into the muscle once for 1 dose.       Review of Systems  Constitutional: Negative for activity change, appetite change, chills, fatigue and fever.  HENT: Negative for congestion, rhinorrhea, sinus pressure, sinus pain, sneezing, sore throat and trouble swallowing.   Eyes: Positive for visual disturbance. Negative for discharge, redness and itching.  Wears eye glasses   Respiratory: Negative for cough, chest tightness, shortness of breath and wheezing.   Cardiovascular: Negative for chest pain, palpitations and leg swelling.  Gastrointestinal: Negative for abdominal distention, abdominal pain, constipation, diarrhea, nausea and vomiting.  Endocrine: Negative for cold intolerance, heat intolerance, polydipsia, polyphagia and polyuria.  Genitourinary: Negative for difficulty urinating, dysuria, flank pain, frequency  and urgency.  Musculoskeletal: Negative for arthralgias, gait problem, joint swelling and myalgias.  Skin: Negative for color change, pallor and rash.  Neurological: Negative for dizziness, speech difficulty, light-headedness, numbness and headaches.       Generalized weakness   Hematological: Does not bruise/bleed easily.  Psychiatric/Behavioral: Negative for agitation, confusion and sleep disturbance. The patient is not nervous/anxious.     Immunization History  Administered Date(s) Administered  . PFIZER SARS-COV-2 Vaccination 12/29/2019, 01/23/2020   Pertinent  Health Maintenance Due  Topic Date Due  . DEXA SCAN  Never done  . PNA vac Low Risk Adult (1 of 2 - PCV13) Never done  . INFLUENZA VACCINE  Never done   Fall Risk  07/03/2020  Falls in the past year? 0  Number falls in past yr: 0  Injury with Fall? 0    Vitals:   07/03/20 1055  BP: (!) 140/100  Pulse: 77  Resp: 16  Temp: (!) 97.1 F (36.2 C)  SpO2: 94%  Weight: 142 lb (64.4 kg)  Height: 5' 6"  (1.676 m)   Body mass index is 22.92 kg/m. Physical Exam Vitals reviewed.  Constitutional:      General: She is not in acute distress.    Appearance: She is not ill-appearing.  HENT:     Head: Normocephalic.     Right Ear: Tympanic membrane, ear canal and external ear normal. There is no impacted cerumen.     Left Ear: Tympanic membrane, ear canal and external ear normal. There is no impacted cerumen.     Nose: Nose normal. No congestion or rhinorrhea.     Mouth/Throat:     Mouth: Mucous membranes are moist.     Pharynx: Oropharynx is clear. No oropharyngeal exudate or posterior oropharyngeal erythema.  Eyes:     General: No scleral icterus.       Right eye: No discharge.        Left eye: No discharge.     Extraocular Movements: Extraocular movements intact.     Conjunctiva/sclera: Conjunctivae normal.     Pupils: Pupils are equal, round, and reactive to light.     Comments: Corrective lens in place   Neck:      Vascular: No carotid bruit.  Cardiovascular:     Rate and Rhythm: Normal rate and regular rhythm.     Pulses: Normal pulses.     Heart sounds: Normal heart sounds. No murmur heard.  No friction rub. No gallop.   Pulmonary:     Effort: Pulmonary effort is normal. No respiratory distress.     Breath sounds: Normal breath sounds. No wheezing, rhonchi or rales.  Chest:     Chest wall: No tenderness.  Abdominal:     General: Bowel sounds are normal. There is no distension.     Palpations: Abdomen is soft. There is no mass.     Tenderness: There is no abdominal tenderness. There is no right CVA tenderness, left CVA tenderness, guarding or rebound.  Musculoskeletal:        General: No swelling or tenderness. Normal range of motion.     Cervical back: Normal range  of motion. No rigidity or tenderness.     Right lower leg: No edema.     Left lower leg: No edema.  Feet:     Right foot:     Skin integrity: Skin integrity normal.     Toenail Condition: Right toenails are abnormally thick and long.     Left foot:     Skin integrity: Skin integrity normal.     Toenail Condition: Left toenails are abnormally thick and long.  Lymphadenopathy:     Cervical: No cervical adenopathy.  Skin:    General: Skin is warm.     Coloration: Skin is not pale.     Findings: No bruising, erythema or rash.  Neurological:     Mental Status: She is alert.     Cranial Nerves: No cranial nerve deficit.     Sensory: No sensory deficit.     Motor: No weakness.     Coordination: Coordination normal.     Gait: Gait abnormal.     Comments: Alert and oriented to self and person disoriented to place   Psychiatric:        Mood and Affect: Mood normal.        Speech: Speech normal.        Behavior: Behavior normal.        Thought Content: Thought content normal.        Cognition and Memory: Memory is impaired.    Labs reviewed: Recent Labs    07/17/20 1923 07/17/20 1923 07/18/20 1136 07/19/20 0554  07/20/20 0546  NA 137  --   --  139 142  K 3.0*  --   --  2.9* 4.2  CL 98  --   --  102 106  CO2 26  --   --  27 26  GLUCOSE 110*  --   --  91 86  BUN 27*  --   --  20 15  CREATININE 1.31*   < > 1.10* 0.96 0.91  CALCIUM 9.5  --   --  9.0 9.2  MG  --   --   --   --  1.6*   < > = values in this interval not displayed.   Recent Labs    07/03/20 1343 07/17/20 1923  AST 12 17  ALT 9 10  ALKPHOS  --  70  BILITOT 0.6 0.6  PROT 6.6 7.6  ALBUMIN  --  4.2   Recent Labs    07/03/20 1343 07/03/20 1343 07/17/20 1923 07/18/20 1136 07/19/20 0554  WBC 9.3   < > 10.0 11.0* 7.5  NEUTROABS 6,733  --  7.3  --   --   HGB 14.5   < > 14.7 14.3 12.2  HCT 43.2   < > 43.8 42.0 36.5  MCV 91.9   < > 89.6 89.9 91.3  PLT 291   < > 358 327 272   < > = values in this interval not displayed.   Lab Results  Component Value Date   TSH 0.56 07/03/2020   No results found for: HGBA1C Lab Results  Component Value Date   CHOL 159 07/03/2020   HDL 45 (L) 07/03/2020   LDLCALC 88 07/03/2020   TRIG 162 (H) 07/03/2020   CHOLHDL 3.5 07/03/2020    Significant Diagnostic Results in last 30 days:  DG Lumbar Spine Complete  Result Date: 07/17/2020 CLINICAL DATA:  Fall, low back pain EXAM: LUMBAR SPINE - COMPLETE 4+ VIEW COMPARISON:  01/07/2018 FINDINGS: There  are 6 non rib bearing segments of the lumbar spine. The lowest segment is considered and L6 to remain consistent with prior imaging. There is normal lumbar lordosis. There is marked intervertebral disc space narrowing and grade 1 anterolisthesis of L5-L6, similar to that noted on prior examination. Vertebral body heights and remaining intervertebral disc heights are preserved. No acute fracture or traumatic listhesis of the lumbar spine. Oblique views fail to demonstrate the pars interarticularis well, but demonstrate at least moderate facet arthrosis at L5-6 bilaterally. The paraspinal soft tissues are unremarkable. IMPRESSION: 1. No acute fracture or  traumatic listhesis of the lumbar spine. 2. Transitional lumbosacral anatomy with 6 non rib bearing segments of the lumbar spine. 3. Marked intervertebral disc space narrowing and grade 1 anterolisthesis of L5-L6, similar to that noted on prior examination. Electronically Signed   By: Fidela Salisbury MD   On: 07/17/2020 19:58   CT Head Wo Contrast  Result Date: 07/17/2020 CLINICAL DATA:  Generalized fatigue and dizziness EXAM: CT HEAD WITHOUT CONTRAST TECHNIQUE: Contiguous axial images were obtained from the base of the skull through the vertex without intravenous contrast. COMPARISON:  January 07, 2018 FINDINGS: Brain: No evidence of acute territorial infarction, hemorrhage, hydrocephalus,extra-axial collection or mass lesion/mass effect. There is dilatation the ventricles and sulci consistent with age-related atrophy. Low-attenuation changes in the deep white matter consistent with small vessel ischemia. Vascular: No hyperdense vessel or unexpected calcification. Skull: The skull is intact. No fracture or focal lesion identified. Sinuses/Orbits: The visualized paranasal sinuses and mastoid air cells are clear. The orbits and globes intact. Other: None IMPRESSION: No acute intracranial abnormality. Findings consistent with age related atrophy and chronic small vessel ischemia Electronically Signed   By: Prudencio Pair M.D.   On: 07/17/2020 23:57   DG HIP UNILAT WITH PELVIS 2-3 VIEWS RIGHT  Result Date: 07/17/2020 CLINICAL DATA:  Low back pain, fall, right hip pain EXAM: DG HIP (WITH OR WITHOUT PELVIS) 2-3V RIGHT COMPARISON:  None. FINDINGS: Single view radiograph pelvis and two view radiograph of the a right hip demonstrates normal alignment. No fracture or dislocation. There is a mixed lytic and sclerotic pattern involving the femoral heads bilaterally in keeping with bilateral femoral head avascular necrosis without associated subchondral collapse. There is superimposed moderate bilateral degenerative hip  arthritis, right slightly more severe than left. Calcific density is seen within the soft tissues anterior to the right hip, possibly the sequela of remote trauma or inflammation. IMPRESSION: 1. Bilateral femoral head avascular necrosis without associated subchondral collapse. 2. Moderate bilateral degenerative hip arthritis, right slightly more severe than left. Electronically Signed   By: Fidela Salisbury MD   On: 07/17/2020 20:01    Assessment/Plan 1. Essential hypertension B/p elevated upon arrival but rechecked B/p at goal. - continue on Hydrochlorothiazide 25 mg tablet daily  - continue on statin for cardiovascular event prevention.Not on ASA due to her advance age and high risk for falls.  - CBC with Differential/Platelets - CMP with eGFR([facility]) - TSH  2. Abnormal weight loss Has had progressive weight loss per family not eating. -continue to monitor weight  - CBC with Differential/Platelets - CMP with eGFR([facility]) - TSH - encouraged to drink protein supplement once daily.  3. Hyperlipidemia LDL goal <100 No recent labs for review. - continue on Atorvastatin 20 mg tablet at bedtime - Lipid Panel  4. Loss of appetite - continue on mirtazapine 15 mg tablet daily at bedtime. Sertraline 25 mg tablet recently added by previous PCP.  5. Late  onset Alzheimer's disease without behavioral disturbance ([place]) No behavioral issues reported.requires assistance with her ADL's and cooking. - Ambulatory referral to [facility] for Nurse aide to assist with her showers.Social worker to assist with in home care assistance. - continue on Aricept 10 mg tablet daily  - continue on Sertraline 25 mg tablet daily and Mirtazapine 15 mg tablet at bedtime.  - Ambulatory referral to Neurology - Vitamin B12  6. Alcohol use - drinks 2 glasses of wine daily.Advised to cut down on drinking.  7. Overgrown toenails Overgrown thick toenails bilateral. - Ambulatory referral to Podiatry  8. Unsteady  gait No recent fall episode. - Ambulatory referral to [facility]. Chronic right hip pain Continue OTC analgesic - Ambulatory referral to [facility]  10. Need for Tdap vaccination Script send to pharmacy.adivised to get vaccine at her pharmacy - Tdap ([place]) 5-2.5-18.5 LF-MCG/0.5 injection; Inject 0.5 mLs into the muscle once for 1 dose.  Dispense: 0.5 mL; Refill: 0  11. Postmenopausal estrogen deficiency No previous bone density for review  - DG Bone Density; Future  Family/ staff Communication: Reviewed plan of care with patient,dsughter and son   Labs/tests ordered:  - CBC with Differential/Platelets - CMP with eGFR([facility]) - TSH - Lipid Panel - Vitamin B12  Next Appointment : 4 months for medical management of chronic issues  Sandrea Hughs, NP

## 2020-07-17 ENCOUNTER — Inpatient Hospital Stay (HOSPITAL_BASED_OUTPATIENT_CLINIC_OR_DEPARTMENT_OTHER)
Admission: EM | Admit: 2020-07-17 | Discharge: 2020-07-20 | DRG: 683 | Disposition: A | Discharge status: Home Health | Payer: Medicare Other | Attending: Family Medicine | Admitting: Family Medicine

## 2020-07-17 ENCOUNTER — Emergency Department (HOSPITAL_BASED_OUTPATIENT_CLINIC_OR_DEPARTMENT_OTHER): Payer: Medicare Other

## 2020-07-17 ENCOUNTER — Encounter (HOSPITAL_BASED_OUTPATIENT_CLINIC_OR_DEPARTMENT_OTHER): Payer: Self-pay | Admitting: *Deleted

## 2020-07-17 ENCOUNTER — Telehealth: Payer: Self-pay

## 2020-07-17 DIAGNOSIS — N179 Acute kidney failure, unspecified: Secondary | ICD-10-CM | POA: Diagnosis not present

## 2020-07-17 DIAGNOSIS — M25551 Pain in right hip: Secondary | ICD-10-CM

## 2020-07-17 DIAGNOSIS — Z9181 History of falling: Secondary | ICD-10-CM

## 2020-07-17 DIAGNOSIS — M879 Osteonecrosis, unspecified: Secondary | ICD-10-CM | POA: Diagnosis present

## 2020-07-17 DIAGNOSIS — R531 Weakness: Secondary | ICD-10-CM | POA: Diagnosis not present

## 2020-07-17 DIAGNOSIS — N390 Urinary tract infection, site not specified: Secondary | ICD-10-CM

## 2020-07-17 DIAGNOSIS — M87059 Idiopathic aseptic necrosis of unspecified femur: Secondary | ICD-10-CM

## 2020-07-17 DIAGNOSIS — Z6820 Body mass index (BMI) 20.0-20.9, adult: Secondary | ICD-10-CM

## 2020-07-17 DIAGNOSIS — Z20822 Contact with and (suspected) exposure to covid-19: Secondary | ICD-10-CM | POA: Diagnosis present

## 2020-07-17 DIAGNOSIS — Y92009 Unspecified place in unspecified non-institutional (private) residence as the place of occurrence of the external cause: Secondary | ICD-10-CM

## 2020-07-17 DIAGNOSIS — G309 Alzheimer's disease, unspecified: Secondary | ICD-10-CM | POA: Diagnosis present

## 2020-07-17 DIAGNOSIS — I10 Essential (primary) hypertension: Secondary | ICD-10-CM

## 2020-07-17 DIAGNOSIS — E876 Hypokalemia: Secondary | ICD-10-CM

## 2020-07-17 DIAGNOSIS — F0280 Dementia in other diseases classified elsewhere without behavioral disturbance: Secondary | ICD-10-CM | POA: Diagnosis present

## 2020-07-17 DIAGNOSIS — W19XXXA Unspecified fall, initial encounter: Secondary | ICD-10-CM

## 2020-07-17 DIAGNOSIS — Z87891 Personal history of nicotine dependence: Secondary | ICD-10-CM

## 2020-07-17 DIAGNOSIS — E440 Moderate protein-calorie malnutrition: Secondary | ICD-10-CM | POA: Insufficient documentation

## 2020-07-17 DIAGNOSIS — R627 Adult failure to thrive: Secondary | ICD-10-CM | POA: Diagnosis present

## 2020-07-17 LAB — URINALYSIS, ROUTINE W REFLEX MICROSCOPIC
Glucose, UA: NEGATIVE mg/dL
Hgb urine dipstick: NEGATIVE
Ketones, ur: NEGATIVE mg/dL
Nitrite: NEGATIVE
Protein, ur: NEGATIVE mg/dL
Specific Gravity, Urine: >1.03 — ABNORMAL HIGH (ref 1.005–1.030)
pH: 5.5 (ref 5.0–8.0)

## 2020-07-17 LAB — COMPREHENSIVE METABOLIC PANEL
ALT: 10 U/L (ref 0–44)
AST: 17 U/L (ref 15–41)
Albumin: 4.2 g/dL (ref 3.5–5.0)
Alkaline Phosphatase: 70 U/L (ref 38–126)
Anion gap: 13 (ref 5–15)
BUN: 27 mg/dL — ABNORMAL HIGH (ref 8–23)
CO2: 26 mmol/L (ref 22–32)
Calcium: 9.5 mg/dL (ref 8.9–10.3)
Chloride: 98 mmol/L (ref 98–111)
Creatinine, Ser: 1.31 mg/dL — ABNORMAL HIGH (ref 0.44–1.00)
GFR calc Af Amer: 44 mL/min — ABNORMAL LOW (ref >60)
GFR calc non Af Amer: 38 mL/min — ABNORMAL LOW (ref >60)
Glucose, Bld: 110 mg/dL — ABNORMAL HIGH (ref 70–99)
Potassium: 3 mmol/L — ABNORMAL LOW (ref 3.5–5.1)
Sodium: 137 mmol/L (ref 135–145)
Total Bilirubin: 0.6 mg/dL (ref 0.3–1.2)
Total Protein: 7.6 g/dL (ref 6.5–8.1)

## 2020-07-17 LAB — CBC WITH DIFFERENTIAL/PLATELET
Abs Immature Granulocytes: 0.03 10*3/uL (ref 0.00–0.07)
Basophils Absolute: 0 10*3/uL (ref 0.0–0.1)
Basophils Relative: 0 %
Eosinophils Absolute: 0.1 10*3/uL (ref 0.0–0.5)
Eosinophils Relative: 1 %
HCT: 43.8 % (ref 36.0–46.0)
Hemoglobin: 14.7 g/dL (ref 12.0–15.0)
Immature Granulocytes: 0 %
Lymphocytes Relative: 19 %
Lymphs Abs: 1.9 10*3/uL (ref 0.7–4.0)
MCH: 30.1 pg (ref 26.0–34.0)
MCHC: 33.6 g/dL (ref 30.0–36.0)
MCV: 89.6 fL (ref 80.0–100.0)
Monocytes Absolute: 0.7 10*3/uL (ref 0.1–1.0)
Monocytes Relative: 7 %
Neutro Abs: 7.3 10*3/uL (ref 1.7–7.7)
Neutrophils Relative %: 73 %
Platelets: 358 10*3/uL (ref 150–400)
RBC: 4.89 MIL/uL (ref 3.87–5.11)
RDW: 11.6 % (ref 11.5–15.5)
WBC: 10 10*3/uL (ref 4.0–10.5)
nRBC: 0 % (ref 0.0–0.2)

## 2020-07-17 LAB — URINALYSIS, MICROSCOPIC (REFLEX)

## 2020-07-17 MED ORDER — POTASSIUM CHLORIDE CRYS ER 20 MEQ PO TBCR
40.0000 meq | EXTENDED_RELEASE_TABLET | Freq: Once | ORAL | Status: AC
  Administered 2020-07-18: 40 meq via ORAL
  Filled 2020-07-17: qty 2

## 2020-07-17 MED ORDER — SODIUM CHLORIDE 0.9 % IV BOLUS
500.0000 mL | Freq: Once | INTRAVENOUS | Status: AC
  Administered 2020-07-18: 500 mL via INTRAVENOUS

## 2020-07-17 NOTE — Telephone Encounter (Signed)
Incoming call received from [facility] indicting patient not able to get out of bed due to severe dizziness x several days and patient is afraid of falling.   No nausea, no vomiting, or other symptoms noted.   [name] questions if patient should be seen in the ER or come in for OV  Please advise

## 2020-07-17 NOTE — Telephone Encounter (Signed)
Need to be seen in the ED as soon as possible.

## 2020-07-17 NOTE — ED Triage Notes (Signed)
Son states generalized fatigue and dizziness x 6 days

## 2020-07-17 NOTE — Telephone Encounter (Signed)
Discussed response with [name]. I advised due to the long wait times in the ER patient needs to take all medications, food, blanket, and a pillow.  [name] verbalized understanding

## 2020-07-18 ENCOUNTER — Other Ambulatory Visit: Payer: Self-pay

## 2020-07-18 ENCOUNTER — Encounter (HOSPITAL_BASED_OUTPATIENT_CLINIC_OR_DEPARTMENT_OTHER): Payer: Self-pay | Admitting: Internal Medicine

## 2020-07-18 ENCOUNTER — Ambulatory Visit: Payer: Self-pay | Admitting: Adult Health

## 2020-07-18 DIAGNOSIS — N390 Urinary tract infection, site not specified: Secondary | ICD-10-CM | POA: Diagnosis present

## 2020-07-18 DIAGNOSIS — Z6820 Body mass index (BMI) 20.0-20.9, adult: Secondary | ICD-10-CM | POA: Diagnosis not present

## 2020-07-18 DIAGNOSIS — Z87891 Personal history of nicotine dependence: Secondary | ICD-10-CM | POA: Diagnosis not present

## 2020-07-18 DIAGNOSIS — E440 Moderate protein-calorie malnutrition: Secondary | ICD-10-CM | POA: Diagnosis present

## 2020-07-18 DIAGNOSIS — N179 Acute kidney failure, unspecified: Secondary | ICD-10-CM | POA: Diagnosis present

## 2020-07-18 DIAGNOSIS — I10 Essential (primary) hypertension: Secondary | ICD-10-CM

## 2020-07-18 DIAGNOSIS — R531 Weakness: Secondary | ICD-10-CM | POA: Diagnosis present

## 2020-07-18 DIAGNOSIS — Z9181 History of falling: Secondary | ICD-10-CM | POA: Diagnosis not present

## 2020-07-18 DIAGNOSIS — Y92009 Unspecified place in unspecified non-institutional (private) residence as the place of occurrence of the external cause: Secondary | ICD-10-CM

## 2020-07-18 DIAGNOSIS — M87059 Idiopathic aseptic necrosis of unspecified femur: Secondary | ICD-10-CM | POA: Diagnosis not present

## 2020-07-18 DIAGNOSIS — G309 Alzheimer's disease, unspecified: Secondary | ICD-10-CM | POA: Diagnosis present

## 2020-07-18 DIAGNOSIS — E876 Hypokalemia: Secondary | ICD-10-CM | POA: Diagnosis present

## 2020-07-18 DIAGNOSIS — Z20822 Contact with and (suspected) exposure to covid-19: Secondary | ICD-10-CM | POA: Diagnosis present

## 2020-07-18 DIAGNOSIS — W19XXXA Unspecified fall, initial encounter: Secondary | ICD-10-CM

## 2020-07-18 DIAGNOSIS — R627 Adult failure to thrive: Secondary | ICD-10-CM | POA: Diagnosis present

## 2020-07-18 DIAGNOSIS — F0280 Dementia in other diseases classified elsewhere without behavioral disturbance: Secondary | ICD-10-CM | POA: Diagnosis present

## 2020-07-18 DIAGNOSIS — M879 Osteonecrosis, unspecified: Secondary | ICD-10-CM | POA: Diagnosis present

## 2020-07-18 LAB — CBC
HCT: 42 % (ref 36.0–46.0)
Hemoglobin: 14.3 g/dL (ref 12.0–15.0)
MCH: 30.6 pg (ref 26.0–34.0)
MCHC: 34 g/dL (ref 30.0–36.0)
MCV: 89.9 fL (ref 80.0–100.0)
Platelets: 327 10*3/uL (ref 150–400)
RBC: 4.67 MIL/uL (ref 3.87–5.11)
RDW: 11.9 % (ref 11.5–15.5)
WBC: 11 10*3/uL — ABNORMAL HIGH (ref 4.0–10.5)
nRBC: 0 % (ref 0.0–0.2)

## 2020-07-18 LAB — CREATININE, SERUM
Creatinine, Ser: 1.1 mg/dL — ABNORMAL HIGH (ref 0.44–1.00)
GFR calc Af Amer: 55 mL/min — ABNORMAL LOW (ref >60)
GFR calc non Af Amer: 47 mL/min — ABNORMAL LOW (ref >60)

## 2020-07-18 LAB — SARS CORONAVIRUS 2 BY RT PCR (HOSPITAL ORDER, PERFORMED IN ~~LOC~~ HOSPITAL LAB): SARS Coronavirus 2: NEGATIVE

## 2020-07-18 MED ORDER — ACETAMINOPHEN 325 MG PO TABS: 650.0000 mg | ORAL_TABLET | Freq: Four times a day (QID) | ORAL | Status: DC | PRN

## 2020-07-18 MED ORDER — HEPARIN SODIUM (PORCINE) 5000 UNIT/ML IJ SOLN
5000.0000 [IU] | Freq: Three times a day (TID) | INTRAMUSCULAR | Status: DC
  Administered 2020-07-18 – 2020-07-20 (×6): 5000 [IU] via SUBCUTANEOUS
  Filled 2020-07-18 (×6): qty 1

## 2020-07-18 MED ORDER — LACTATED RINGERS IV SOLN: INTRAVENOUS | Status: AC

## 2020-07-18 MED ORDER — SODIUM CHLORIDE 0.9 % IV SOLN
1.0000 g | Freq: Every day | INTRAVENOUS | Status: DC
  Administered 2020-07-18 – 2020-07-19 (×2): 1 g via INTRAVENOUS
  Filled 2020-07-18 (×2): qty 1
  Filled 2020-07-18 (×2): qty 10

## 2020-07-18 MED ORDER — SERTRALINE HCL 25 MG PO TABS
25.0000 mg | ORAL_TABLET | Freq: Every day | ORAL | Status: DC
  Administered 2020-07-19 (×2): 25 mg via ORAL
  Filled 2020-07-18 (×2): qty 1

## 2020-07-18 MED ORDER — MIRTAZAPINE 15 MG PO TABS
15.0000 mg | ORAL_TABLET | Freq: Every day | ORAL | Status: DC
  Administered 2020-07-19 (×2): 15 mg via ORAL
  Filled 2020-07-18 (×2): qty 1

## 2020-07-18 MED ORDER — ACETAMINOPHEN 650 MG RE SUPP: 650.0000 mg | Freq: Four times a day (QID) | RECTAL | Status: DC | PRN

## 2020-07-18 MED ORDER — METOPROLOL TARTRATE 5 MG/5ML IV SOLN: 5.0000 mg | Freq: Four times a day (QID) | INTRAVENOUS | Status: DC | PRN

## 2020-07-18 MED ORDER — POLYETHYLENE GLYCOL 3350 17 G PO PACK: 17.0000 g | PACK | Freq: Every day | ORAL | Status: DC | PRN

## 2020-07-18 MED ORDER — ATORVASTATIN CALCIUM 20 MG PO TABS
20.0000 mg | ORAL_TABLET | Freq: Every day | ORAL | Status: DC
  Administered 2020-07-19 (×2): 20 mg via ORAL
  Filled 2020-07-18 (×2): qty 1

## 2020-07-18 MED ORDER — ORAL CARE MOUTH RINSE
15.0000 mL | Freq: Two times a day (BID) | OROMUCOSAL | Status: DC
  Administered 2020-07-18 – 2020-07-20 (×5): 15 mL via OROMUCOSAL

## 2020-07-18 MED ORDER — DONEPEZIL HCL 10 MG PO TABS
10.0000 mg | ORAL_TABLET | Freq: Every day | ORAL | Status: DC
  Administered 2020-07-19 (×2): 10 mg via ORAL
  Filled 2020-07-18 (×2): qty 1

## 2020-07-18 NOTE — Evaluation (Signed)
Physical Therapy Evaluation Patient Details Name: Christina Perkins MRN: [id number] DOB: 1940-12-31 Today's Date: 07/18/2020   History of Present Illness  79 yo female admitted with weakness, fall at home, dizziness. hx of AVN, OA, dementia  Clinical Impression  On eval, pt required Min assist for mobility. She walked ~125 fee with a RW. Pt stated she doesn't normally use a walker at home-she usually "furniture walks.". Pt is unsteady and at risk for falls when mobilizing without a device. No family present during session but pt stated family check on her often. Unsure of d/c plan. If pt has sufficient assistand and supervision at home, she could potentially return home with HHPT    Follow Up Recommendations SNF (HHPT and 24 hour supervision/assist if pt and/or family decline placement)    Equipment Recommendations  None recommended by PT    Recommendations for Other Services       Precautions / Restrictions Precautions Precautions: Fall Restrictions Weight Bearing Restrictions: No      Mobility  Bed Mobility Overal bed mobility: Needs Assistance Bed Mobility: Sit to Supine       Sit to supine: Min assist   General bed mobility comments: Assist for LEs onto bed.  Transfers Overall transfer level: Needs assistance Equipment used: Rolling walker (2 wheeled);None Transfers: Sit to/from Stand Sit to Stand: Min assist         General transfer comment: Assist to rise, stabilize, control descent. VCs safety, hand placement.  Ambulation/Gait Ambulation/Gait assistance: Min assist Gait Distance (Feet): 125 Feet (125'x1, 30'x1) Assistive device: Rolling walker (2 wheeled);1 person hand held assist;None Gait Pattern/deviations: Decreased stride length;Decreased step length - right;Decreased step length - left     General Gait Details: walked x 1 with RW-Min guard assist. walked x 1 without a device-Min assist to steady intermittently-pt had a tendency to "furniture  walk"  Stairs            Wheelchair Mobility    Modified Rankin (Stroke Patients Only)       Balance Overall balance assessment: Needs assistance           Standing balance-Leahy Scale: Poor                               Pertinent Vitals/Pain Pain Assessment: No/denies pain    Home Living Family/patient expects to be discharged to:: Unsure Living Arrangements: Alone Available Help at Discharge: Available PRN/intermittently;Family Type of Home: House         Home Equipment: Walker - 2 wheels      Prior Function Level of Independence: Independent         Comments: furniture walks     Hand Dominance        Extremity/Trunk Assessment   Upper Extremity Assessment Upper Extremity Assessment: Overall WFL for tasks assessed    Lower Extremity Assessment Lower Extremity Assessment: Generalized weakness    Cervical / Trunk Assessment Cervical / Trunk Assessment: Kyphotic  Communication   Communication: No difficulties  Cognition Arousal/Alertness: Awake/alert Behavior During Therapy: WFL for tasks assessed/performed Overall Cognitive Status: Impaired/Different from baseline Area of Impairment: Memory                     Memory: Decreased short-term memory         General Comments: hx of dementia. some problems with word finding and memory      General Comments      Exercises  Assessment/Plan    PT Assessment Patient needs continued PT services  PT Problem List Decreased strength;Decreased mobility;Decreased activity tolerance;Decreased balance;Decreased knowledge of use of DME;Decreased safety awareness       PT Treatment Interventions DME instruction;Gait training;Therapeutic activities;Therapeutic exercise;Patient/family education;Balance training;Functional mobility training    PT Goals (Current goals can be found in the Care Plan section)  Acute Rehab PT Goals Patient Stated Goal: home PT Goal  Formulation: With patient Time For Goal Achievement: 08/01/20 Potential to Achieve Goals: Good    Frequency Min 3X/week   Barriers to discharge        Co-evaluation               AM-PAC PT "6 Clicks" Mobility  Outcome Measure Help needed turning from your back to your side while in a flat bed without using bedrails?: A Little Help needed moving from lying on your back to sitting on the side of a flat bed without using bedrails?: A Little Help needed moving to and from a bed to a chair (including a wheelchair)?: A Little Help needed standing up from a chair using your arms (e.g., wheelchair or bedside chair)?: A Little Help needed to walk in hospital room?: A Little Help needed climbing 3-5 steps with a railing? : A Lot 6 Click Score: 17    End of Session Equipment Utilized During Treatment: Gait belt Activity Tolerance: Patient tolerated treatment well Patient left: in bed;with call bell/phone within reach;with bed alarm set   PT Visit Diagnosis: Muscle weakness (generalized) (M62.81);History of falling (Z91.81);Unsteadiness on feet (R26.81)    Time: 1937-9024 PT Time Calculation (min) (ACUTE ONLY): 20 min   Charges:   PT Evaluation $PT Eval Low Complexity: 1 Low             Faye Ramsay, PT Acute Rehabilitation  Office: [phone] Pager: [phone]

## 2020-07-18 NOTE — ED Provider Notes (Signed)
[facility] EMERGENCY DEPARTMENT Provider Note   CSN: [id number] Arrival date & time: 07/17/20  1858     History Chief Complaint  Patient presents with  . Fatigue    Christina Perkins is a 79 y.o. female.  The history is provided by a relative. The history is limited by the condition of the patient (level 5 caveat dementia ).  Weakness Severity:  Severe Onset quality:  Gradual Timing:  Constant Progression:  Unchanged Chronicity:  New Context: not alcohol use, not allergies, not change in medication and not decreased sleep   Relieved by:  Nothing Worsened by:  Nothing Ineffective treatments:  None tried Associated symptoms: difficulty walking   Associated symptoms: no cough, no drooling, no numbness in extremities, no falls, no fever, no frequency, no stroke symptoms and no syncope   Risk factors: no anemia and no family hx of stroke        Past Medical History:  Diagnosis Date  . Dementia (HCC)   . H/O mammogram 2019   Per [facility] new patient packet  . Hx of colonoscopy 2016   Per [facility] new patient packet  . Hypertension     Patient Active Problem List   Diagnosis Date Noted  . Weakness 07/18/2020  . Injury of toe on right foot 11/07/2013  . Edema of toe 11/07/2013  . Pain in toe of right foot 11/07/2013    History reviewed. No pertinent surgical history.   OB History   No obstetric history on file.     Family History  Problem Relation Age of Onset  . Dementia Mother   . Dementia Father     Social History   Tobacco Use  . Smoking status: Former Games developer  . Smokeless tobacco: Never Used  Substance Use Topics  . Alcohol use: Yes    Comment: 6 glasses of wine a week.  . Drug use: No    Home Medications Prior to Admission medications   Medication Sig Start Date End Date Taking? Authorizing Provider  atorvastatin (LIPITOR) 20 MG tablet Take 20 mg by mouth at bedtime.     [provider]  cholecalciferol (VITAMIN D3) 25 MCG (1000 UNIT)  tablet Take 1,000 Units by mouth daily.    [provider]  donepezil (ARICEPT) 10 MG tablet Take 10 mg by mouth daily at 12 noon. Before Bedtime.    [provider]  gabapentin (NEURONTIN) 100 MG capsule Take 100 mg by mouth 2 (two) times daily.    [provider]  hydrochlorothiazide (HYDRODIURIL) 25 MG tablet Take 25 mg by mouth daily.    [provider]  mirtazapine (REMERON) 15 MG tablet Take 15 mg by mouth at bedtime.    [provider]  sertraline (ZOLOFT) 25 MG tablet Take 25 mg by mouth at bedtime.    [provider]    Allergies    Patient has no known allergies.  Review of Systems   Review of Systems  Unable to perform ROS: Dementia  Constitutional: Positive for fatigue. Negative for fever.  HENT: Negative for drooling.   Respiratory: Negative for cough.   Cardiovascular: Negative for syncope.  Genitourinary: Negative for frequency.  Musculoskeletal: Negative for falls.  Neurological: Positive for weakness.    Physical Exam Updated Vital Signs BP (!) 150/85 (BP Location: Right Arm)   Pulse 72   Temp 97.7 F (36.5 C) (Oral)   Resp 16   Ht  (1.6 m)   Wt 63.5 kg  SpO2 98%   BMI 24.80 kg/m   Physical Exam Vitals and nursing note reviewed.  Constitutional:      General: She is not in acute distress.    Appearance: Normal appearance.  HENT:     Head: Normocephalic and atraumatic.     Nose: Nose normal.  Eyes:     Conjunctiva/sclera: Conjunctivae normal.     Pupils: Pupils are equal, round, and reactive to light.  Cardiovascular:     Rate and Rhythm: Normal rate and regular rhythm.     Pulses: Normal pulses.     Heart sounds: Normal heart sounds.  Pulmonary:     Effort: Pulmonary effort is normal.     Breath sounds: Normal breath sounds.  Abdominal:     General: Abdomen is flat. Bowel sounds are normal.     Palpations: Abdomen is soft.     Tenderness: There is no abdominal tenderness. There is no  guarding.  Musculoskeletal:        General: Normal range of motion.     Cervical back: Normal range of motion and neck supple.  Skin:    General: Skin is warm and dry.     Capillary Refill: Capillary refill takes less than 2 seconds.  Neurological:     General: No focal deficit present.     Mental Status: She is alert.     Deep Tendon Reflexes: Reflexes normal.  Psychiatric:        Mood and Affect: Mood normal.     ED Results / Procedures / Treatments   Labs (all labs ordered are listed, but only abnormal results are displayed) Results for orders placed or performed during the hospital encounter of 07/17/20  SARS Coronavirus 2 by RT PCR (hospital order, performed in [facility] hospital lab) Nasopharyngeal Nasopharyngeal Swab   Specimen: Nasopharyngeal Swab  Result Value Ref Range   SARS Coronavirus 2 NEGATIVE NEGATIVE  Urinalysis, Routine w reflex microscopic  Result Value Ref Range   Color, Urine YELLOW YELLOW   APPearance CLOUDY (A) CLEAR   Specific Gravity, Urine >1.030 (H) 1.005 - 1.030   pH 5.5 5.0 - 8.0   Glucose, UA NEGATIVE NEGATIVE mg/dL   Hgb urine dipstick NEGATIVE NEGATIVE   Bilirubin Urine MODERATE (A) NEGATIVE   Ketones, ur NEGATIVE NEGATIVE mg/dL   Protein, ur NEGATIVE NEGATIVE mg/dL   Nitrite NEGATIVE NEGATIVE   Leukocytes,Ua TRACE (A) NEGATIVE  CBC with Differential  Result Value Ref Range   WBC 10.0 4.0 - 10.5 K/uL   RBC 4.89 3.87 - 5.11 MIL/uL   Hemoglobin 14.7 12.0 - 15.0 g/dL   HCT 94.7 36 - 46 %   MCV 89.6 80.0 - 100.0 fL   MCH 30.1 26.0 - 34.0 pg   MCHC 33.6 30.0 - 36.0 g/dL   RDW 65.4 65.0 - 35.4 %   Platelets 358 150 - 400 K/uL   nRBC 0.0 0.0 - 0.2 %   Neutrophils Relative % 73 %   Neutro Abs 7.3 1.7 - 7.7 K/uL   Lymphocytes Relative 19 %   Lymphs Abs 1.9 0.7 - 4.0 K/uL   Monocytes Relative 7 %   Monocytes Absolute 0.7 0 - 1 K/uL   Eosinophils Relative 1 %   Eosinophils Absolute 0.1 0 - 0 K/uL   Basophils Relative 0 %   Basophils  Absolute 0.0 0 - 0 K/uL   Immature Granulocytes 0 %   Abs Immature Granulocytes 0.03 0.00 - 0.07 K/uL  Comprehensive metabolic panel  Result Value Ref Range   Sodium 137 135 - 145 mmol/L   Potassium 3.0 (L) 3.5 - 5.1 mmol/L   Chloride 98 98 - 111 mmol/L   CO2 26 22 - 32 mmol/L   Glucose, Bld 110 (H) 70 - 99 mg/dL   BUN 27 (H) 8 - 23 mg/dL   Creatinine, Ser 1.611.31 (H) 0.44 - 1.00 mg/dL   Calcium 9.5 8.9 - 09.610.3 mg/dL   Total Protein 7.6 6.5 - 8.1 g/dL   Albumin 4.2 3.5 - 5.0 g/dL   AST 17 15 - 41 U/L   ALT 10 0 - 44 U/L   Alkaline Phosphatase 70 38 - 126 U/L   Total Bilirubin 0.6 0.3 - 1.2 mg/dL   GFR calc non Af Amer 38 (L) >60 mL/min   GFR calc Af Amer 44 (L) >60 mL/min   Anion gap 13 5 - 15  Urinalysis, Microscopic (reflex)  Result Value Ref Range   RBC / HPF 6-10 0 - 5 RBC/hpf   WBC, UA 21-50 0 - 5 WBC/hpf   Bacteria, UA MANY (A) NONE SEEN   Squamous Epithelial / LPF 0-5 0 - 5   Mucus PRESENT    Hyaline Casts, UA PRESENT    DG Lumbar Spine Complete  Result Date: 07/17/2020 CLINICAL DATA:  Fall, low back pain EXAM: LUMBAR SPINE - COMPLETE 4+ VIEW COMPARISON:  01/07/2018 FINDINGS: There are 6 non rib bearing segments of the lumbar spine. The lowest segment is considered and L6 to remain consistent with prior imaging. There is normal lumbar lordosis. There is marked intervertebral disc space narrowing and grade 1 anterolisthesis of L5-L6, similar to that noted on prior examination. Vertebral body heights and remaining intervertebral disc heights are preserved. No acute fracture or traumatic listhesis of the lumbar spine. Oblique views fail to demonstrate the pars interarticularis well, but demonstrate at least moderate facet arthrosis at L5-6 bilaterally. The paraspinal soft tissues are unremarkable. IMPRESSION: 1. No acute fracture or traumatic listhesis of the lumbar spine. 2. Transitional lumbosacral anatomy with 6 non rib bearing segments of the lumbar spine. 3. Marked  intervertebral disc space narrowing and grade 1 anterolisthesis of L5-L6, similar to that noted on prior examination. Electronically Signed   By: Helyn NumbersAshesh  Parikh MD   On: 07/17/2020 19:58   CT Head Wo Contrast  Result Date: 07/17/2020 CLINICAL DATA:  Generalized fatigue and dizziness EXAM: CT HEAD WITHOUT CONTRAST TECHNIQUE: Contiguous axial images were obtained from the base of the skull through the vertex without intravenous contrast. COMPARISON:  January 07, 2018 FINDINGS: Brain: No evidence of acute territorial infarction, hemorrhage, hydrocephalus,extra-axial collection or mass lesion/mass effect. There is dilatation the ventricles and sulci consistent with age-related atrophy. Low-attenuation changes in the deep white matter consistent with small vessel ischemia. Vascular: No hyperdense vessel or unexpected calcification. Skull: The skull is intact. No fracture or focal lesion identified. Sinuses/Orbits: The visualized paranasal sinuses and mastoid air cells are clear. The orbits and globes intact. Other: None IMPRESSION: No acute intracranial abnormality. Findings consistent with age related atrophy and chronic small vessel ischemia Electronically Signed   By: Jonna ClarkBindu  Avutu M.D.   On: 07/17/2020 23:57   DG HIP UNILAT WITH PELVIS 2-3 VIEWS RIGHT  Result Date: 07/17/2020 CLINICAL DATA:  Low back pain, fall, right hip pain EXAM: DG HIP (WITH OR WITHOUT PELVIS) 2-3V RIGHT COMPARISON:  None. FINDINGS: Single view radiograph pelvis and two view radiograph of the a right hip demonstrates normal alignment. No fracture or dislocation.  There is a mixed lytic and sclerotic pattern involving the femoral heads bilaterally in keeping with bilateral femoral head avascular necrosis without associated subchondral collapse. There is superimposed moderate bilateral degenerative hip arthritis, right slightly more severe than left. Calcific density is seen within the soft tissues anterior to the right hip, possibly the  sequela of remote trauma or inflammation. IMPRESSION: 1. Bilateral femoral head avascular necrosis without associated subchondral collapse. 2. Moderate bilateral degenerative hip arthritis, right slightly more severe than left. Electronically Signed   By: Helyn Numbers MD   On: 07/17/2020 20:01  ]  EKG None  Radiology DG Lumbar Spine Complete  Result Date: 07/17/2020 CLINICAL DATA:  Fall, low back pain EXAM: LUMBAR SPINE - COMPLETE 4+ VIEW COMPARISON:  01/07/2018 FINDINGS: There are 6 non rib bearing segments of the lumbar spine. The lowest segment is considered and L6 to remain consistent with prior imaging. There is normal lumbar lordosis. There is marked intervertebral disc space narrowing and grade 1 anterolisthesis of L5-L6, similar to that noted on prior examination. Vertebral body heights and remaining intervertebral disc heights are preserved. No acute fracture or traumatic listhesis of the lumbar spine. Oblique views fail to demonstrate the pars interarticularis well, but demonstrate at least moderate facet arthrosis at L5-6 bilaterally. The paraspinal soft tissues are unremarkable. IMPRESSION: 1. No acute fracture or traumatic listhesis of the lumbar spine. 2. Transitional lumbosacral anatomy with 6 non rib bearing segments of the lumbar spine. 3. Marked intervertebral disc space narrowing and grade 1 anterolisthesis of L5-L6, similar to that noted on prior examination. Electronically Signed   By: Helyn Numbers MD   On: 07/17/2020 19:58   CT Head Wo Contrast  Result Date: 07/17/2020 CLINICAL DATA:  Generalized fatigue and dizziness EXAM: CT HEAD WITHOUT CONTRAST TECHNIQUE: Contiguous axial images were obtained from the base of the skull through the vertex without intravenous contrast. COMPARISON:  January 07, 2018 FINDINGS: Brain: No evidence of acute territorial infarction, hemorrhage, hydrocephalus,extra-axial collection or mass lesion/mass effect. There is dilatation the ventricles and  sulci consistent with age-related atrophy. Low-attenuation changes in the deep white matter consistent with small vessel ischemia. Vascular: No hyperdense vessel or unexpected calcification. Skull: The skull is intact. No fracture or focal lesion identified. Sinuses/Orbits: The visualized paranasal sinuses and mastoid air cells are clear. The orbits and globes intact. Other: None IMPRESSION: No acute intracranial abnormality. Findings consistent with age related atrophy and chronic small vessel ischemia Electronically Signed   By: Jonna Clark M.D.   On: 07/17/2020 23:57   DG HIP UNILAT WITH PELVIS 2-3 VIEWS RIGHT  Result Date: 07/17/2020 CLINICAL DATA:  Low back pain, fall, right hip pain EXAM: DG HIP (WITH OR WITHOUT PELVIS) 2-3V RIGHT COMPARISON:  None. FINDINGS: Single view radiograph pelvis and two view radiograph of the a right hip demonstrates normal alignment. No fracture or dislocation. There is a mixed lytic and sclerotic pattern involving the femoral heads bilaterally in keeping with bilateral femoral head avascular necrosis without associated subchondral collapse. There is superimposed moderate bilateral degenerative hip arthritis, right slightly more severe than left. Calcific density is seen within the soft tissues anterior to the right hip, possibly the sequela of remote trauma or inflammation. IMPRESSION: 1. Bilateral femoral head avascular necrosis without associated subchondral collapse. 2. Moderate bilateral degenerative hip arthritis, right slightly more severe than left. Electronically Signed   By: Helyn Numbers MD   On: 07/17/2020 20:01    Procedures Procedures (including critical care time)  Medications Ordered  in ED Medications  potassium chloride SA (KLOR-CON) CR tablet 40 mEq (40 mEq Oral Given 07/18/20 0017)  sodium chloride 0.9 % bolus 500 mL (0 mLs Intravenous Stopped 07/18/20 0159)    ED Course  I have reviewed the triage vital signs and the nursing notes.  Pertinent  labs & imaging results that were available during my care of the patient were reviewed by me and considered in my medical decision making (see chart for details).    Suspect weakness which is global is caused by UTI.    Taralee Marcus was evaluated in Emergency Department on 07/18/2020 for the symptoms described in the history of present illness. She was evaluated in the context of the global COVID-19 pandemic, which necessitated consideration that the patient might be at risk for infection with the SARS-CoV-2 virus that causes COVID-19. Institutional protocols and algorithms that pertain to the evaluation of patients at risk for COVID-19 are in a state of rapid change based on information released by regulatory bodies including the CDC and federal and state organizations. These policies and algorithms were followed during the patient's care in the ED.  Final Clinical Impression(s) / ED Diagnoses Final diagnoses:  Urinary tract infection without hematuria, site unspecified   Admit to medicine    Johnaton Sonneborn, MD 07/18/20 3354

## 2020-07-18 NOTE — ED Notes (Signed)
Report to [facility] 

## 2020-07-18 NOTE — H&P (Signed)
History and Physical        Hospital Admission Note Date: 07/18/2020  Patient name: Christina Perkins Medical record number: [id number] Date of birth: [birth year] Age: 79 y.o. Gender: female  PCP: Ngetich, Donalee Citrin, NP  Patient coming from: home Lives with: alone At baseline, ambulates: walker  Chief Complaint    Chief Complaint  Patient presents with  . Fatigue      HPI:   History obtained from prior notes and son at bedside.   This is an 79 year old female with past medical history of hypertension, hyperlipidemia, Alzheimer's disease, MDR E coli UTI, alcohol use who recently establish care with her PCP on 8/17 and reported failure to thrive type symptoms at the time (abnormal weight loss, loss of appetite) who's family called her PCP on 8/31 due to patient not able to get out of bed due to severe dizziness and fear of falling presented to [facility], diagnosed with a UTI and transferred to [facility] for treatment.   Son states that the patient had a fall a few days ago. Patient does not recall the events leading up to it but denies LOC. Does not remember much of the remaining details. Patient has bilateral hip pain R>L.   ED Course: T 97.6, P 86, RR 16, BP 141/71. Notable Labs: K 3.0, BUN 27, Cr 1.31 (1.07 on 8/17), WBC 10, UA positive for UTI. COVID 19 negative. Lumbar spine x-ray: Chronic issues noted, no acute pathology; Hip XR: Bilateral femoral head avascular necrosis without subchondral collapse, moderate bilateral hip arthritis R>L. CT head: chronic small vessel ischemia, no acute changes. She was given 500 mL NS bolus x 1 and KCl 40 mEq.  Vitals:   07/18/20 0609 07/18/20 0948  BP: (!) 141/71 131/76  Pulse: 86 73  Resp: 16 17  Temp: 97.6 F (36.4 C) 97.7 F (36.5 C)  SpO2: 97% 98%     Review of Systems:  Review of Systems  Constitutional: Negative for chills and fever.   Respiratory: Negative for cough and shortness of breath.   Cardiovascular: Negative for chest pain and palpitations.  Gastrointestinal: Negative for nausea and vomiting.  Genitourinary: Positive for frequency.  Musculoskeletal: Positive for falls and joint pain. Negative for back pain.       Fall one week ago  Neurological: Positive for dizziness and weakness. Negative for sensory change and focal weakness.  All other systems reviewed and are negative.   Medical/Social/Family History   Past Medical History: Past Medical History:  Diagnosis Date  . Dementia (HCC)   . H/O mammogram 2019   Per [facility] new patient packet  . Hx of colonoscopy 2016   Per [facility] new patient packet  . Hypertension     History reviewed. No pertinent surgical history.  Medications: Prior to Admission medications   Medication Sig Start Date End Date Taking? Authorizing Provider  atorvastatin (LIPITOR) 20 MG tablet Take 20 mg by mouth at bedtime.     [provider]  cholecalciferol (VITAMIN D3) 25 MCG (1000 UNIT) tablet Take 1,000 Units by mouth daily.    [provider]  donepezil (ARICEPT) 10 MG tablet Take 10 mg by mouth daily at 12 noon. Before Bedtime.  [provider]  gabapentin (NEURONTIN) 100 MG capsule Take 100 mg by mouth 2 (two) times daily.    [provider]  hydrochlorothiazide (HYDRODIURIL) 25 MG tablet Take 25 mg by mouth daily.    [provider]  mirtazapine (REMERON) 15 MG tablet Take 15 mg by mouth at bedtime.    [provider]  sertraline (ZOLOFT) 25 MG tablet Take 25 mg by mouth at bedtime.    [provider]    Allergies:  No Known Allergies  Social History:  reports that she has quit smoking. She has never used smokeless tobacco. She reports current alcohol use. She reports that she does not use drugs.  Family History: Family History  Problem Relation Age of Onset  . Dementia Mother   . Dementia Father       Objective   Physical Exam: Blood pressure 131/76, pulse 73, temperature 97.7 F (36.5 C), temperature source Oral, resp. rate 17, height 5\' 6"  (1.676 m), weight 57.6 kg, SpO2 98 %.  Physical Exam Vitals and nursing note reviewed. Exam conducted with a chaperone present.  Constitutional:      General: She is not in acute distress.    Appearance: She is not toxic-appearing.  HENT:     Head: Normocephalic.  Eyes:     General: No scleral icterus.    Pupils: Pupils are equal, round, and reactive to light.  Cardiovascular:     Rate and Rhythm: Normal rate and regular rhythm.     Heart sounds: No murmur heard.   Pulmonary:     Effort: Pulmonary effort is normal. No respiratory distress.     Breath sounds: Normal breath sounds.  Abdominal:     General: Abdomen is flat. There is no distension.     Palpations: Abdomen is soft.     Comments: Mild lower abdominal tenderness to palpation  Musculoskeletal:        General: No swelling.     Comments: Some right hip pain with active ROM  Skin:    Coloration: Skin is not jaundiced or pale.  Neurological:     General: No focal deficit present.     Mental Status: She is alert.     Cranial Nerves: No cranial nerve deficit.     Sensory: No sensory deficit.     Motor: No weakness.     Coordination: Coordination normal.     Comments: Negative heel to shin bilaterally Negative finger to nose Negative dysarthria  Psychiatric:        Mood and Affect: Mood normal.        Behavior: Behavior normal.     LABS on Admission: I have personally reviewed all the labs and imaging below    Basic Metabolic Panel: Recent Labs  Lab 07/17/20 1923 07/18/20 1136  NA 137  --   K 3.0*  --   CL 98  --   CO2 26  --   GLUCOSE 110*  --   BUN 27*  --   CREATININE 1.31* 1.10*  CALCIUM 9.5  --    Liver Function Tests: Recent Labs  Lab 07/17/20 1923  AST 17  ALT 10  ALKPHOS 70  BILITOT 0.6  PROT 7.6  ALBUMIN 4.2   No results for  input(s): LIPASE, AMYLASE in the last 168 hours. No results for input(s): AMMONIA in the last 168 hours. CBC: Recent Labs  Lab 07/17/20 1923 07/17/20 1923 07/18/20 1136  WBC 10.0  --  11.0*  NEUTROABS 7.3  --   --  HGB 14.7  --  14.3  HCT 43.8  --  42.0  MCV 89.6   < > 89.9  PLT 358  --  327   < > = values in this interval not displayed.   Cardiac Enzymes: No results for input(s): CKTOTAL, CKMB, CKMBINDEX, TROPONINI in the last 168 hours. BNP: Invalid input(s): POCBNP CBG: No results for input(s): GLUCAP in the last 168 hours.  Radiological Exams on Admission:  DG Lumbar Spine Complete  Result Date: 07/17/2020 CLINICAL DATA:  Fall, low back pain EXAM: LUMBAR SPINE - COMPLETE 4+ VIEW COMPARISON:  01/07/2018 FINDINGS: There are 6 non rib bearing segments of the lumbar spine. The lowest segment is considered and L6 to remain consistent with prior imaging. There is normal lumbar lordosis. There is marked intervertebral disc space narrowing and grade 1 anterolisthesis of L5-L6, similar to that noted on prior examination. Vertebral body heights and remaining intervertebral disc heights are preserved. No acute fracture or traumatic listhesis of the lumbar spine. Oblique views fail to demonstrate the pars interarticularis well, but demonstrate at least moderate facet arthrosis at L5-6 bilaterally. The paraspinal soft tissues are unremarkable. IMPRESSION: 1. No acute fracture or traumatic listhesis of the lumbar spine. 2. Transitional lumbosacral anatomy with 6 non rib bearing segments of the lumbar spine. 3. Marked intervertebral disc space narrowing and grade 1 anterolisthesis of L5-L6, similar to that noted on prior examination. Electronically Signed   By: Helyn Numbers MD   On: 07/17/2020 19:58   CT Head Wo Contrast  Result Date: 07/17/2020 CLINICAL DATA:  Generalized fatigue and dizziness EXAM: CT HEAD WITHOUT CONTRAST TECHNIQUE: Contiguous axial images were obtained from the base of the  skull through the vertex without intravenous contrast. COMPARISON:  January 07, 2018 FINDINGS: Brain: No evidence of acute territorial infarction, hemorrhage, hydrocephalus,extra-axial collection or mass lesion/mass effect. There is dilatation the ventricles and sulci consistent with age-related atrophy. Low-attenuation changes in the deep white matter consistent with small vessel ischemia. Vascular: No hyperdense vessel or unexpected calcification. Skull: The skull is intact. No fracture or focal lesion identified. Sinuses/Orbits: The visualized paranasal sinuses and mastoid air cells are clear. The orbits and globes intact. Other: None IMPRESSION: No acute intracranial abnormality. Findings consistent with age related atrophy and chronic small vessel ischemia Electronically Signed   By: Jonna Clark M.D.   On: 07/17/2020 23:57   DG HIP UNILAT WITH PELVIS 2-3 VIEWS RIGHT  Result Date: 07/17/2020 CLINICAL DATA:  Low back pain, fall, right hip pain EXAM: DG HIP (WITH OR WITHOUT PELVIS) 2-3V RIGHT COMPARISON:  None. FINDINGS: Single view radiograph pelvis and two view radiograph of the a right hip demonstrates normal alignment. No fracture or dislocation. There is a mixed lytic and sclerotic pattern involving the femoral heads bilaterally in keeping with bilateral femoral head avascular necrosis without associated subchondral collapse. There is superimposed moderate bilateral degenerative hip arthritis, right slightly more severe than left. Calcific density is seen within the soft tissues anterior to the right hip, possibly the sequela of remote trauma or inflammation. IMPRESSION: 1. Bilateral femoral head avascular necrosis without associated subchondral collapse. 2. Moderate bilateral degenerative hip arthritis, right slightly more severe than left. Electronically Signed   By: Helyn Numbers MD   On: 07/17/2020 20:01      EKG: Not done   A & P   Principal Problem:   Generalized weakness Active  Problems:   Acute lower UTI   Fall at home, initial encounter   Hypokalemia  Avascular necrosis of femoral head (HCC)   Essential hypertension   1. Generalized weakness likely secondary to UTI a. Afebrile, hemodynamically stable on room air  b. History of MDR E coli UTI c. Continue Ceftriaxone d. Follow up urine culture e. IV fluids f. PT/OT eval  2. AKI, likely from decreased PO intake a. Cr 1.07 on 8/17, currently 1.31 b. Hold home HCTZ c. IV fluids  3. Recent fall, unknown etiology a. likely from UTI, possibly orthostatic hypotension? And/or from hip pain b. Orthostatic vital signs c. PT eval  4. Hypokalemia a. Replete  5. Bilateral femoral head avascular necrosis a. Pain management b. PT  c. Outpatient follow up  6. Hypertension a. Hold home HCTZ b. PRN metoprolol   7. Alzheimer's Dementia a. Continue home meds   DVT prophylaxis: heparin, scd's   Code Status: Full Code  Diet: heart healthy Family Communication: Admission, patients condition and plan of care including tests being ordered have been discussed with the patient who indicates understanding and agrees with the plan and Code Status. Patient's son was updated  Disposition Plan: The appropriate patient status for this patient is INPATIENT. Inpatient status is judged to be reasonable and necessary in order to provide the required intensity of service to ensure the patient's safety. The patient's presenting symptoms, physical exam findings, and initial radiographic and laboratory data in the context of their chronic comorbidities is felt to place them at high risk for further clinical deterioration. Furthermore, it is not anticipated that the patient will be medically stable for discharge from the hospital within 2 midnights of admission. The following factors support the patient status of inpatient.   " The patient's presenting symptoms include generalized weakness. " The worrisome physical exam findings  include abdominal discomfort. " The initial radiographic and laboratory data are worrisome because of bilateral femoral head avascular necrosis, positive UA, AKI " The chronic co-morbidities include Alzheimer's dementia, hypertension   * I certify that at the point of admission it is my clinical judgment that the patient will require inpatient hospital care spanning beyond 2 midnights from the point of admission due to high intensity of service, high risk for further deterioration and high frequency of surveillance required.*   Status is: Observation  The patient will require care spanning > 2 midnights and should be moved to inpatient because: IV treatments appropriate due to intensity of illness or inability to take PO and Inpatient level of care appropriate due to severity of illness  Dispo: The patient is from: Home              Anticipated d/c is to: TBD              Anticipated d/c date is: 2 days              Patient currently is not medically stable to d/c.     Consultants  . none  Procedures  . none  Time Spent on Admission: 60 minutes    Jae DireJared E Raymar Joiner, DO [facility] Pager [phone] 07/18/2020, 1:34 PM

## 2020-07-19 DIAGNOSIS — E440 Moderate protein-calorie malnutrition: Secondary | ICD-10-CM

## 2020-07-19 LAB — URINE CULTURE

## 2020-07-19 LAB — CBC
HCT: 36.5 % (ref 36.0–46.0)
Hemoglobin: 12.2 g/dL (ref 12.0–15.0)
MCH: 30.5 pg (ref 26.0–34.0)
MCHC: 33.4 g/dL (ref 30.0–36.0)
MCV: 91.3 fL (ref 80.0–100.0)
Platelets: 272 10*3/uL (ref 150–400)
RBC: 4 MIL/uL (ref 3.87–5.11)
RDW: 11.9 % (ref 11.5–15.5)
WBC: 7.5 10*3/uL (ref 4.0–10.5)
nRBC: 0 % (ref 0.0–0.2)

## 2020-07-19 LAB — BASIC METABOLIC PANEL
Anion gap: 10 (ref 5–15)
BUN: 20 mg/dL (ref 8–23)
CO2: 27 mmol/L (ref 22–32)
Calcium: 9 mg/dL (ref 8.9–10.3)
Chloride: 102 mmol/L (ref 98–111)
Creatinine, Ser: 0.96 mg/dL (ref 0.44–1.00)
GFR calc Af Amer: >60 mL/min (ref >60)
GFR calc non Af Amer: 56 mL/min — ABNORMAL LOW (ref >60)
Glucose, Bld: 91 mg/dL (ref 70–99)
Potassium: 2.9 mmol/L — ABNORMAL LOW (ref 3.5–5.1)
Sodium: 139 mmol/L (ref 135–145)

## 2020-07-19 MED ORDER — POTASSIUM CHLORIDE CRYS ER 20 MEQ PO TBCR
40.0000 meq | EXTENDED_RELEASE_TABLET | Freq: Two times a day (BID) | ORAL | Status: AC
  Administered 2020-07-19 (×2): 40 meq via ORAL
  Filled 2020-07-19 (×2): qty 2

## 2020-07-19 MED ORDER — MAGNESIUM OXIDE 400 (241.3 MG) MG PO TABS
400.0000 mg | ORAL_TABLET | Freq: Once | ORAL | Status: AC
  Administered 2020-07-19: 400 mg via ORAL
  Filled 2020-07-19: qty 1

## 2020-07-19 MED ORDER — ENSURE ENLIVE PO LIQD
237.0000 mL | Freq: Two times a day (BID) | ORAL | Status: DC
  Administered 2020-07-19: 237 mL via ORAL

## 2020-07-19 NOTE — Progress Notes (Signed)
Physical Therapy Treatment Patient Details Name: Christina Perkins MRN: [id number] DOB: 08/14/1941 Today's Date: 07/19/2020    History of Present Illness 79 yo female admitted with weakness, fall at home, dizziness. hx of AVN, OA, dementia    PT Comments    Patient required min assist for bed mob and extra time to sequence. Min guard/assist with RW for transfers and gait, pt c/o of dizziness after ~50' which improved with seated rest. Pt abel to progress gait to ambulate additional 100' with RW, min assist for navigation, and no c/o of dizziness. Educated pt on LE strengthening with repeated sit<>stands. Acute PT will continue to progress mobility as able.    Follow Up Recommendations  SNF (HHPT and 24 hour supervision/assist if pt and/or family decl)     Equipment Recommendations  None recommended by PT    Recommendations for Other Services       Precautions / Restrictions Precautions Precautions: Fall Restrictions Weight Bearing Restrictions: No    Mobility  Bed Mobility Overal bed mobility: Needs Assistance Bed Mobility: Supine to Sit     Supine to sit: HOB elevated;Min assist     General bed mobility comments: pt initaited bring LE's toward EOB, min assist with verbal/tactile cues to fully bring LE's off EOB and raise trunk to sit.   Transfers Overall transfer level: Needs assistance Equipment used: Rolling walker (2 wheeled) Transfers: Sit to/from Stand Sit to Stand: Min guard         General transfer comment: pt using bil UE on RW for power up, min gaurd for safety with rising. tactile cues for safe reach back to sit in chair at EOS.   Ambulation/Gait Ambulation/Gait assistance: Min assist Gait Distance (Feet): 140 Feet (1x40 1x100) Assistive device: Rolling walker (2 wheeled) Gait Pattern/deviations: Decreased stride length;Decreased step length - right;Decreased step length - left Gait velocity: decr   General Gait Details: cues for safe proximity to RW  and assist to manage position/direction in hallway. HR max of 112 with gait.    Stairs             Wheelchair Mobility    Modified Rankin (Stroke Patients Only)       Balance Overall balance assessment: Needs assistance Sitting-balance support: No upper extremity supported;Feet supported Sitting balance-Leahy Scale: Good     Standing balance support: Bilateral upper extremity supported;During functional activity Standing balance-Leahy Scale: Fair               Cognition Arousal/Alertness: Awake/alert Behavior During Therapy: WFL for tasks assessed/performed Overall Cognitive Status: History of cognitive impairments - at baseline Area of Impairment: Memory          Memory: Decreased short-term memory                Exercises Other Exercises Other Exercises: 1x5 reps Sit<>Stand from recliner, 1 UE for power up from arm rest.     General Comments        Pertinent Vitals/Pain Pain Assessment: No/denies pain           PT Goals (current goals can now be found in the care plan section) Acute Rehab PT Goals Patient Stated Goal: To go home PT Goal Formulation: With patient Time For Goal Achievement: 08/01/20 Potential to Achieve Goals: Good Progress towards PT goals: Progressing toward goals    Frequency    Min 3X/week      PT Plan Current plan remains appropriate       AM-PAC PT "6 Clicks" Mobility  Outcome Measure  Help needed turning from your back to your side while in a flat bed without using bedrails?: A Little Help needed moving from lying on your back to sitting on the side of a flat bed without using bedrails?: A Little Help needed moving to and from a bed to a chair (including a wheelchair)?: A Little Help needed standing up from a chair using your arms (e.g., wheelchair or bedside chair)?: A Little Help needed to walk in hospital room?: A Little Help needed climbing 3-5 steps with a railing? : A Lot 6 Click Score: 17     End of Session Equipment Utilized During Treatment: Gait belt Activity Tolerance: Patient tolerated treatment well Patient left: in chair;with call bell/phone within reach;with chair alarm set Nurse Communication: Mobility status PT Visit Diagnosis: Muscle weakness (generalized) (M62.81);History of falling (Z91.81);Unsteadiness on feet (R26.81)     Time: 4562-5638 PT Time Calculation (min) (ACUTE ONLY): 28 min  Charges:  $Gait Training: 8-22 mins $Therapeutic Exercise: 8-22 mins                     Wynn Maudlin, DPT Acute Rehabilitation Services  Office [phone] Pager [phone]  07/19/2020 7:20 PM

## 2020-07-19 NOTE — NC FL2 (Signed)
[place] MEDICAID FL2 LEVEL OF CARE SCREENING TOOL     IDENTIFICATION  Patient Name: Christina Perkins Birthdate: [birth year] Sex: female Admission Date (Current Location): 07/17/2020  [facility] and [place] Number:  Producer, television/film/video and Address:  [facility],  [address] [place], [place] [address]      Provider Number: [id number]  Attending Physician Name and Address:  Tyrone Nine, MD  Relative Name and Phone Number:  [name] (daughter) [phone]    Current Level of Care: Hospital Recommended Level of Care: Skilled Nursing Facility Prior Approval Number:    Date Approved/Denied:   PASRR Number: pending  Discharge Plan: SNF    Current Diagnoses: Patient Active Problem List   Diagnosis Date Noted  . Malnutrition of moderate degree 07/19/2020  . Weakness 07/18/2020  . Generalized weakness 07/18/2020  . Acute lower UTI 07/18/2020  . Fall at home, initial encounter 07/18/2020  . Hypokalemia 07/18/2020  . Avascular necrosis of femoral head (HCC) 07/18/2020  . Essential hypertension 07/18/2020  . Injury of toe on right foot 11/07/2013  . Edema of toe 11/07/2013  . Pain in toe of right foot 11/07/2013    Orientation RESPIRATION BLADDER Height & Weight     Self, Place  Normal Continent Weight: 57.6 kg Height:  5\' 6"  (167.6 cm)  BEHAVIORAL SYMPTOMS/MOOD NEUROLOGICAL BOWEL NUTRITION STATUS      Continent Diet (Heart Healthy)  AMBULATORY STATUS COMMUNICATION OF NEEDS Skin   Limited Assist Verbally Normal                       Personal Care Assistance Level of Assistance  Dressing, Bathing Bathing Assistance: Limited assistance   Dressing Assistance: Limited assistance     Functional Limitations Info  Sight, Hearing, Speech Sight Info: Impaired Hearing Info: Adequate Speech Info: Adequate    SPECIAL CARE FACTORS FREQUENCY  PT (By licensed PT), OT (By licensed OT)     PT Frequency: 5 x weekly OT Frequency: 5 x weekly             Contractures Contractures Info: Not present    Additional Factors Info  Code Status, Allergies Code Status Info: Full Allergies Info: None known           Current Medications (07/19/2020):  This is the current hospital active medication list Current Facility-Administered Medications  Medication Dose Route Frequency Provider Last Rate Last Admin  . acetaminophen (TYLENOL) tablet 650 mg  650 mg Oral Q6H PRN 09/18/2020, MD       Or  . acetaminophen (TYLENOL) suppository 650 mg  650 mg Rectal Q6H PRN Jae Dire, MD      . atorvastatin (LIPITOR) tablet 20 mg  20 mg Oral QHS Jae Dire, MD   20 mg at 07/19/20 0016  . cefTRIAXone (ROCEPHIN) 1 g in sodium chloride 0.9 % 100 mL IVPB  1 g Intravenous Daily 09/18/20, MD 200 mL/hr at 07/19/20 0953 1 g at 07/19/20 0953  . donepezil (ARICEPT) tablet 10 mg  10 mg Oral QHS 09/18/20, MD   10 mg at 07/19/20 0016  . feeding supplement (ENSURE ENLIVE) (ENSURE ENLIVE) liquid 237 mL  237 mL Oral BID BM 09/18/20 B, MD   237 mL at 07/19/20 1158  . heparin injection 5,000 Units  5,000 Units Subcutaneous Q8H 09/18/20, MD   5,000 Units at 07/19/20 [phone]  . MEDLINE mouth rinse  15 mL Mouth Rinse BID  Eduard Clos, MD   15 mL at 07/19/20 1037  . metoprolol tartrate (LOPRESSOR) injection 5 mg  5 mg Intravenous Q6H PRN Jae Dire, MD      . mirtazapine (REMERON) tablet 15 mg  15 mg Oral QHS Jae Dire, MD   15 mg at 07/19/20 0016  . polyethylene glycol (MIRALAX / GLYCOLAX) packet 17 g  17 g Oral Daily PRN Jae Dire, MD      . potassium chloride SA (KLOR-CON) CR tablet 40 mEq  40 mEq Oral BID Tyrone Nine, MD   40 mEq at 07/19/20 0950  . sertraline (ZOLOFT) tablet 25 mg  25 mg Oral QHS Jae Dire, MD   25 mg at 07/19/20 0017     Discharge Medications: Please see discharge summary for a list of discharge medications.  Relevant Imaging Results:  Relevant Lab Results:   Additional Information Pfizer  covid vaccines 12/29/2019 and 01/23/2020. SS# [id number]  Letisha Yera, Meriam Sprague, RN

## 2020-07-19 NOTE — Progress Notes (Signed)
Initial Nutrition Assessment  DOCUMENTATION CODES:   Non-severe (moderate) malnutrition in context of chronic illness  INTERVENTION:   -Ensure Enlive po BID, each supplement provides 350 kcal and 20 grams of protein -Ordered lunch for patient  NUTRITION DIAGNOSIS:   Moderate Malnutrition related to chronic illness (dementia) as evidenced by percent weight loss, mild fat depletion, energy intake < or equal to 75% for > or equal to 1 month.  GOAL:   Patient will meet greater than or equal to 90% of their needs  MONITOR:   PO intake, Supplement acceptance, Labs, Weight trends, I & O's  REASON FOR ASSESSMENT:   Malnutrition Screening Tool    ASSESSMENT:   79 year old female with past medical history of hypertension, hyperlipidemia, Alzheimer's disease, MDR E coli UTI, alcohol use who recently establish care with her PCP on 8/17 and reported failure to thrive type symptoms at the time (abnormal weight loss, loss of appetite) who's family called her PCP on 8/31 due to patient not able to get out of bed due to severe dizziness and fear of falling presented to [facility], diagnosed with a UTI and transferred to [facility] for treatment.  Patient in room with no family at bedside. Pt with Alzheimer's dementia. Pt able to speak with RD, repeated herself a few times during conversation. Pt reports her appetite has been poor for "a while now". Pt states she typically eats chicken tenders with green beans for meals, sometimes has eggs or mussels and snacks on chips. Pt states she has had Ensure in the past, is willing to drink them this admission. Pt had not eaten yet today, has not ordered a meal. RD placed lunch order for patient.  Per weight records, pt has lost 14 lbs since 8/17 (9% wt loss x <1 month, significant for time frame).  Medications: Remeron, KLOR-CON  Labs reviewed:  Low K  NUTRITION - FOCUSED PHYSICAL EXAM:    Most Recent Value  Orbital Region No depletion  Upper Arm Region Mild  depletion  Thoracic and Lumbar Region Unable to assess  Buccal Region Mild depletion  Temple Region No depletion  Clavicle Bone Region No depletion  Clavicle and Acromion Bone Region No depletion  Scapular Bone Region No depletion  Dorsal Hand No depletion  Patellar Region Unable to assess  Anterior Thigh Region Unable to assess  Posterior Calf Region Unable to assess  Edema (RD Assessment) None  Hair Unable to assess  Eyes Reviewed  Mouth Reviewed  Skin Reviewed       Diet Order:   Diet Order            Diet Heart Room service appropriate? Yes; Fluid consistency: Thin  Diet effective now                 EDUCATION NEEDS:   No education needs have been identified at this time  Skin:  Skin Assessment: Reviewed RN Assessment  Last BM:  8/31  Height:   Ht Readings from Last 1 Encounters:  07/18/20 5\' 6"  (1.676 m)    Weight:   Wt Readings from Last 1 Encounters:  07/18/20 57.6 kg    BMI:  Body mass index is 20.5 kg/m.  Estimated Nutritional Needs:   Kcal:  1500-1700  Protein:  65-75g  Fluid:  1.7L/day   09/17/20, MS, RD, LDN Inpatient Clinical Dietitian Contact information available via Amion

## 2020-07-19 NOTE — Evaluation (Signed)
Occupational Therapy Evaluation Patient Details Name: Christina Perkins MRN: [id number] DOB: 11-Jun-1941 Today's Date: 07/19/2020    History of Present Illness 79 yo female admitted with weakness, fall at home, dizziness. hx of AVN, OA, dementia   Clinical Impression   Christina Perkins is an 79 year old woman admitted to hospital for UTI. On evaluation patient presents with functional ability to perform functional mobility and ADLs with min guard, use of RW and verbal cues for safety. Patient reports having a RW but not using it. Also reports a fall at home. Patient able to donn socks, underwear and perform toileting with verbal cues for hand placement for safety but no physical assistance. Patient exhibited fair to good balance - not needing upper extremity support when performing ADLs. Patient exhibited no loss of balance during evaluation. Patient appears to be near her baseline. Patient will benefit from skilled OT services while in hospital in order to maintain functional abilities and improve to supervision level of care.  Per her report she lives alone and has assistance from family that come and go. Patient requires supervision for her safety due to recent fall and dementia. Therapist recommends 24/7 supervision at home. If family unable to provide that level of care - therapist recommends ALF.     Follow Up Recommendations  Supervision/Assistance - 24 hour;Other (comment) (ALF)    Equipment Recommendations  3 in 1 bedside commode (over toilet, if not high)    Recommendations for Other Services       Precautions / Restrictions Precautions Precautions: Fall Restrictions Weight Bearing Restrictions: No      Mobility Bed Mobility Overal bed mobility: Needs Assistance Bed Mobility: Sit to Supine;Supine to Sit     Supine to sit: Supervision;HOB elevated Sit to supine: Supervision;HOB elevated      Transfers Overall transfer level: Needs assistance Equipment used: Rolling  walker (2 wheeled);None Transfers: Sit to/from Stand Sit to Stand: Min guard         General transfer comment: Patient requires increased time to stand from low bed height. Min guard to ambulate to bathroom with RW. With backing up to toilet patient moving the walker back and forth as if in a vacuum motion. Min guard and verbal cues to use grab bar and hand placement for toilet transfer. Min guard to stand from toilet, use of grab bar and verbal cue needed, min guard to stand at sink and ambulate back to bed without RW.    Balance Overall balance assessment: Needs assistance Sitting-balance support: No upper extremity supported;Feet supported Sitting balance-Leahy Scale: Good     Standing balance support: During functional activity   Standing balance comment: Patient able to stand at sink to perform grooming and perform toileting without upper extremity support.                           ADL either performed or assessed with clinical judgement   ADL Overall ADL's : Needs assistance/impaired Eating/Feeding: Independent   Grooming: Standing;Min guard;Wash/dry hands Grooming Details (indicate cue type and reason): stood at sink to wash hands. Upper Body Bathing: Set up;Sitting   Lower Body Bathing: Set up;Sit to/from stand;Min guard   Upper Body Dressing : Set up;Sitting   Lower Body Dressing: Sit to/from stand;Min guard;Set up   Toilet Transfer: Solicitor;Ambulation;RW;Grab bars   Toileting- Clothing Manipulation and Hygiene: Min guard               Vision  Vision Assessment?: No apparent visual deficits     Perception     Praxis      Pertinent Vitals/Pain Pain Assessment: No/denies pain     Hand Dominance Right   Extremity/Trunk Assessment Upper Extremity Assessment Upper Extremity Assessment: Overall WFL for tasks assessed       Cervical / Trunk Assessment Cervical / Trunk Assessment: Kyphotic   Communication  Communication Communication: No difficulties   Cognition Arousal/Alertness: Awake/alert Behavior During Therapy: WFL for tasks assessed/performed Overall Cognitive Status: History of cognitive impairments - at baseline Area of Impairment: Memory                     Memory: Decreased short-term memory         General Comments: Knows the year. Not the month. Her birthdate and age. Knows the Biden.   General Comments       Exercises     Shoulder Instructions      Home Living Family/patient expects to be discharged to:: Unsure Living Arrangements: Alone Available Help at Discharge: Available PRN/intermittently;Family Type of Home: House                       Home Equipment: Walker - 2 wheels;Shower seat   Additional Comments: 2 steps to get inside with rails, doesn't use walker she says. tub/shower w/ seat      Prior Functioning/Environment Level of Independence: Independent        Comments: furniture walks. CHildren take her to the grocery time.        OT Problem List: Decreased safety awareness;Decreased cognition;Decreased knowledge of use of DME or AE      OT Treatment/Interventions: Self-care/ADL training;Patient/family education;Therapeutic activities;DME and/or AE instruction    OT Goals(Current goals can be found in the care plan section) Acute Rehab OT Goals Patient Stated Goal: To go home OT Goal Formulation: With patient Time For Goal Achievement: 08/02/20 Potential to Achieve Goals: Good  OT Frequency: Min 2X/week   Barriers to D/C: Decreased caregiver support          Co-evaluation              AM-PAC OT "6 Clicks" Daily Activity     Outcome Measure Help from another person eating meals?: None Help from another person taking care of personal grooming?: A Little Help from another person toileting, which includes using toliet, bedpan, or urinal?: A Little Help from another person bathing (including washing, rinsing,  drying)?: A Little Help from another person to put on and taking off regular upper body clothing?: A Little Help from another person to put on and taking off regular lower body clothing?: A Little 6 Click Score: 19   End of Session Equipment Utilized During Treatment: Gait belt;Rolling walker Nurse Communication: Mobility status  Activity Tolerance: Patient tolerated treatment well Patient left: in bed;with call bell/phone within reach;with bed alarm set  OT Visit Diagnosis: History of falling (Z91.81)                Time: 1115-1140 OT Time Calculation (min): 25 min Charges:  OT General Charges $OT Visit: 1 Visit OT Evaluation $OT Eval Low Complexity: 1 Low OT Treatments $Self Care/Home Management : 8-22 mins  Christina Perkins, OTR/L Acute Care Rehab Services  Office [phone] Pager: [phone]   Kelli Churn 07/19/2020, 1:18 PM

## 2020-07-19 NOTE — Progress Notes (Addendum)
PROGRESS NOTE  Christina Perkins  FTD:[id number] DOB: 04-23-1941 DOA: 07/17/2020 PCP: Caesar Bookman, NP   Brief Narrative: Christina Perkins is an 79 year old female with a history of HTN, HLD, Alzheimer's disease, MDR E coli UTI, alcohol use who recently establish care with her PCP on 8/17 and reported failure to thrive type symptoms at the time (abnormal weight loss, loss of appetite) whose family called her PCP on 8/31 due to patient not able to get out of bed due to severe dizziness and fear of falling presented to [facility], diagnosed with a UTI and transferred to [facility] for treatment.   Son states that the patient had a fall a few days ago. Patient does not recall the events leading up to it but denies LOC. Does not remember much of the remaining details. Patient has bilateral hip pain R>L.   ED Course: T 97.6, P 86, RR 16, BP 141/71. Notable Labs: K 3.0, BUN 27, Cr 1.31 (1.07 on 8/17), WBC 10, UA positive for UTI. COVID 19 negative. Lumbar spine x-ray: Chronic issues noted, no acute pathology; Hip XR: Bilateral femoral head avascular necrosis without subchondral collapse, moderate bilateral hip arthritis R>L. CT head: chronic small vessel ischemia, no acute changes. She was given 500 mL NS bolus x 1 and KCl 40 mEq.  Assessment & Plan: Principal Problem:   Generalized weakness Active Problems:   Acute lower UTI   Fall at home, initial encounter   Hypokalemia   Avascular necrosis of femoral head (HCC)   Essential hypertension   Malnutrition of moderate degree  Generalized weakness, poor per oral intake, fall at home: Improving with UTI Tx.  - Continue PT/OT, SNF recommended. CSW consulted.  - Will continue IVF for now as pt's po intake is negligible.   UTI:  - Hx MDR E. coli UTI, so will continue to monitor urine culture (pending at this time)  AKI: Improving - Holding HCTZ, giving IVF as above  Alzheimer's dementia:  - Delirium precautions. With recent failure to thrive and fall at home, I  have concerns about her returning home without assistance/supervision.  - Continue aricept, remeron, SSRI.   Moderate protein calorie malnutrition:  - Supplement as able  Hypokalemia: Refractory to supplementation - Will repeat K supp and add magnesium, recheck in AM.   DVT prophylaxis: Heparin Code Status: Full Family Communication: None at bedside, spoke with daughter by phone Disposition Plan:  Status is: Inpatient  Remains inpatient appropriate because:Ongoing diagnostic testing needed not appropriate for outpatient work up and Unsafe d/c plan   Dispo: The patient is from: Home              Anticipated d/c is to: SNF              Anticipated d/c date is: 1 day              Patient currently is not medically stable to d/c.  Consultants:   None  Procedures:   None  Antimicrobials:  Ceftriaxone   Subjective: Feels stronger, but still not eating much, did take an ensure. Reports of clearing mentation per RN staff. She still feels wobbly when she's out of bed, lightheaded and generally weak. No fevers, not sure of dysuria.   Objective: Vitals:   07/18/20 1605 07/18/20 1709 07/18/20 2018 07/19/20 0647  BP: (!) 89/66 117/74 132/80 (!) 151/76  Pulse: 98 85 74 68  Resp: 20 17 16 14   Temp: (!) 97.5 F (36.4 C) 98.6 F (37 C) 98  F (36.7 C) 98.3 F (36.8 C)  TempSrc: Oral Oral Oral Oral  SpO2: 100% 96% 97% 98%  Weight:      Height:        Intake/Output Summary (Last 24 hours) at 07/19/2020 1246 Last data filed at 07/19/2020 1103 Gross per 24 hour  Intake 237 ml  Output 650 ml  Net -413 ml   Filed Weights   07/17/20 1915 07/18/20 1127  Weight: 63.5 kg 57.6 kg    Gen: Elderly, frail female in no distress  Pulm: Non-labored breathing room air. Clear to auscultation bilaterally.  CV: Regular rate and rhythm. No murmur, rub, or gallop. No JVD, no pedal edema. GI: Abdomen soft, some suprapubic tenderness, no CVA tenderness, no rebound, non-distended, with  normoactive bowel sounds. No organomegaly or masses felt. Ext: Warm, no deformities Skin: No rashes, lesions or ulcers Neuro: Alert and oriented. No focal neurological deficits. Psych: Judgement and insight appear normal. Mood & affect appropriate.   Data Reviewed: I have personally reviewed following labs and imaging studies  CBC: Recent Labs  Lab 07/17/20 1923 07/18/20 1136 07/19/20 0554  WBC 10.0 11.0* 7.5  NEUTROABS 7.3  --   --   HGB 14.7 14.3 12.2  HCT 43.8 42.0 36.5  MCV 89.6 89.9 91.3  PLT 358 327 272   Basic Metabolic Panel: Recent Labs  Lab 07/17/20 1923 07/18/20 1136 07/19/20 0554  NA 137  --  139  K 3.0*  --  2.9*  CL 98  --  102  CO2 26  --  27  GLUCOSE 110*  --  91  BUN 27*  --  20  CREATININE 1.31* 1.10* 0.96  CALCIUM 9.5  --  9.0   GFR: Estimated Creatinine Clearance: 42.5 mL/min (by C-G formula based on SCr of 0.96 mg/dL). Liver Function Tests: Recent Labs  Lab 07/17/20 1923  AST 17  ALT 10  ALKPHOS 70  BILITOT 0.6  PROT 7.6  ALBUMIN 4.2   No results for input(s): LIPASE, AMYLASE in the last 168 hours. No results for input(s): AMMONIA in the last 168 hours. Coagulation Profile: No results for input(s): INR, PROTIME in the last 168 hours. Cardiac Enzymes: No results for input(s): CKTOTAL, CKMB, CKMBINDEX, TROPONINI in the last 168 hours. BNP (last 3 results) No results for input(s): PROBNP in the last 8760 hours. HbA1C: No results for input(s): HGBA1C in the last 72 hours. CBG: No results for input(s): GLUCAP in the last 168 hours. Lipid Profile: No results for input(s): CHOL, HDL, LDLCALC, TRIG, CHOLHDL, LDLDIRECT in the last 72 hours. Thyroid Function Tests: No results for input(s): TSH, T4TOTAL, FREET4, T3FREE, THYROIDAB in the last 72 hours. Anemia Panel: No results for input(s): VITAMINB12, FOLATE, FERRITIN, TIBC, IRON, RETICCTPCT in the last 72 hours. Urine analysis:    Component Value Date/Time   COLORURINE YELLOW 07/17/2020  2331   APPEARANCEUR CLOUDY (A) 07/17/2020 2331   LABSPEC >1.030 (H) 07/17/2020 2331   PHURINE 5.5 07/17/2020 2331   GLUCOSEU NEGATIVE 07/17/2020 2331   HGBUR NEGATIVE 07/17/2020 2331   BILIRUBINUR MODERATE (A) 07/17/2020 2331   KETONESUR NEGATIVE 07/17/2020 2331   PROTEINUR NEGATIVE 07/17/2020 2331   NITRITE NEGATIVE 07/17/2020 2331   LEUKOCYTESUR TRACE (A) 07/17/2020 2331   Recent Results (from the past 240 hour(s))  SARS Coronavirus 2 by RT PCR (hospital order, performed in [facility] hospital lab) Nasopharyngeal Nasopharyngeal Swab     Status: None   Collection Time: 07/17/20 11:24 PM   Specimen: Nasopharyngeal Swab  Result Value Ref Range Status   SARS Coronavirus 2 NEGATIVE NEGATIVE Final    Comment: (NOTE) SARS-CoV-2 target nucleic acids are NOT DETECTED.  The SARS-CoV-2 RNA is generally detectable in upper and lower respiratory specimens during the acute phase of infection. The lowest concentration of SARS-CoV-2 viral copies this assay can detect is 250 copies / mL. A negative result does not preclude SARS-CoV-2 infection and should not be used as the sole basis for treatment or other patient management decisions.  A negative result may occur with improper specimen collection / handling, submission of specimen other than nasopharyngeal swab, presence of viral mutation(s) within the areas targeted by this assay, and inadequate number of viral copies (<250 copies / mL). A negative result must be combined with clinical observations, patient history, and epidemiological information.  Fact Sheet for Patients:   BoilerBrush.com.cy  Fact Sheet for Healthcare Providers: https://pope.com/  This test is not yet approved or  cleared by the [place] FDA and has been authorized for detection and/or diagnosis of SARS-CoV-2 by FDA under an Emergency Use Authorization (EUA).  This EUA will remain in effect (meaning this test can be  used) for the duration of the COVID-19 declaration under Section 564(b)(1) of the Act, 21 U.S.C. section 360bbb-3(b)(1), unless the authorization is terminated or revoked sooner.  Performed at [facility], [address], [place], [place] [address]       Radiology Studies: DG Lumbar Spine Complete  Result Date: 07/17/2020 CLINICAL DATA:  Fall, low back pain EXAM: LUMBAR SPINE - COMPLETE 4+ VIEW COMPARISON:  01/07/2018 FINDINGS: There are 6 non rib bearing segments of the lumbar spine. The lowest segment is considered and L6 to remain consistent with prior imaging. There is normal lumbar lordosis. There is marked intervertebral disc space narrowing and grade 1 anterolisthesis of L5-L6, similar to that noted on prior examination. Vertebral body heights and remaining intervertebral disc heights are preserved. No acute fracture or traumatic listhesis of the lumbar spine. Oblique views fail to demonstrate the pars interarticularis well, but demonstrate at least moderate facet arthrosis at L5-6 bilaterally. The paraspinal soft tissues are unremarkable. IMPRESSION: 1. No acute fracture or traumatic listhesis of the lumbar spine. 2. Transitional lumbosacral anatomy with 6 non rib bearing segments of the lumbar spine. 3. Marked intervertebral disc space narrowing and grade 1 anterolisthesis of L5-L6, similar to that noted on prior examination. Electronically Signed   By: Helyn Numbers MD   On: 07/17/2020 19:58   CT Head Wo Contrast  Result Date: 07/17/2020 CLINICAL DATA:  Generalized fatigue and dizziness EXAM: CT HEAD WITHOUT CONTRAST TECHNIQUE: Contiguous axial images were obtained from the base of the skull through the vertex without intravenous contrast. COMPARISON:  January 07, 2018 FINDINGS: Brain: No evidence of acute territorial infarction, hemorrhage, hydrocephalus,extra-axial collection or mass lesion/mass effect. There is dilatation the ventricles and sulci consistent with  age-related atrophy. Low-attenuation changes in the deep white matter consistent with small vessel ischemia. Vascular: No hyperdense vessel or unexpected calcification. Skull: The skull is intact. No fracture or focal lesion identified. Sinuses/Orbits: The visualized paranasal sinuses and mastoid air cells are clear. The orbits and globes intact. Other: None IMPRESSION: No acute intracranial abnormality. Findings consistent with age related atrophy and chronic small vessel ischemia Electronically Signed   By: Jonna Clark M.D.   On: 07/17/2020 23:57   DG HIP UNILAT WITH PELVIS 2-3 VIEWS RIGHT  Result Date: 07/17/2020 CLINICAL DATA:  Low back pain, fall, right hip  pain EXAM: DG HIP (WITH OR WITHOUT PELVIS) 2-3V RIGHT COMPARISON:  None. FINDINGS: Single view radiograph pelvis and two view radiograph of the a right hip demonstrates normal alignment. No fracture or dislocation. There is a mixed lytic and sclerotic pattern involving the femoral heads bilaterally in keeping with bilateral femoral head avascular necrosis without associated subchondral collapse. There is superimposed moderate bilateral degenerative hip arthritis, right slightly more severe than left. Calcific density is seen within the soft tissues anterior to the right hip, possibly the sequela of remote trauma or inflammation. IMPRESSION: 1. Bilateral femoral head avascular necrosis without associated subchondral collapse. 2. Moderate bilateral degenerative hip arthritis, right slightly more severe than left. Electronically Signed   By: Helyn NumbersAshesh  Parikh MD   On: 07/17/2020 20:01    Scheduled Meds: . atorvastatin  20 mg Oral QHS  . donepezil  10 mg Oral QHS  . feeding supplement (ENSURE ENLIVE)  237 mL Oral BID BM  . heparin  5,000 Units Subcutaneous Q8H  . mouth rinse  15 mL Mouth Rinse BID  . mirtazapine  15 mg Oral QHS  . potassium chloride  40 mEq Oral BID  . sertraline  25 mg Oral QHS   Continuous Infusions: . cefTRIAXone (ROCEPHIN)   IV 1 g (07/19/20 0953)     LOS: 1 day   Time spent: 25 minutes.  Tyrone Nineyan B Ayan Heffington, MD [facility] [email or web address] 07/19/2020, 12:46 PM

## 2020-07-19 NOTE — TOC Initial Note (Addendum)
Transition of Care [facility]) - Initial/Assessment Note    Patient Details  Name: Christina Perkins MRN: [id number] Date of Birth: 28-Sep-1941  Transition [facility]) CM/SW Contact:    Lynnell Catalan, RN Phone Number: 07/19/2020, 1:55 PM  Clinical Narrative:                 Pt from home alone. TOC consult for SNF placement. This CM met with pt at bedside for dc planning. Pt defers dc planning to her daughter Joseph Art. This CM went over physical therapy recommendations with pt and then with [name] via phone. [name] would like to speak with her siblings about planning and would like this CM in the meantime to fax out pt info to area SNFs to see what beds are available. This CM faxed out FL2 and will start auth for SNF with [facility] Medicare. Additional info requested by Passr and will be sent as requested.  Addendum Passr received:  [id number]   Expected Discharge Plan: [place] Barriers to Discharge: Continued Medical Work up   Patient Goals and CMS Choice Patient states their goals for this hospitalization and ongoing recovery are:: To get home      Expected Discharge Plan and Services Expected Discharge Plan: [place]   Discharge Planning Services: CM Consult   Living arrangements for the past 2 months: Single Family Home                                      Prior Living Arrangements/Services Living arrangements for the past 2 months: Single Family Home Lives with:: Self Patient language and need for interpreter reviewed:: Yes        Need for Family Participation in Patient Care: Yes (Comment) Care giver support system in place?: Yes (comment)      Activities of Daily Living Home Assistive Devices/Equipment: Gilford Rile (specify type) ADL Screening (condition at time of admission) Patient's cognitive ability adequate to safely complete daily activities?: Yes Is the patient deaf or have difficulty hearing?: No Does the patient have difficulty  seeing, even when wearing glasses/contacts?: No Does the patient have difficulty concentrating, remembering, or making decisions?: Yes Patient able to express need for assistance with ADLs?: Yes Does the patient have difficulty dressing or bathing?: Yes Independently performs ADLs?: No Communication: Independent Dressing (OT): Needs assistance Is this a change from baseline?: Pre-admission baseline Grooming: Needs assistance Is this a change from baseline?: Pre-admission baseline Feeding: Independent Bathing: Needs assistance Is this a change from baseline?: Pre-admission baseline Toileting: Needs assistance Is this a change from baseline?: Pre-admission baseline In/Out Bed: Needs assistance Is this a change from baseline?: Pre-admission baseline Walks in Home: Independent with device (comment) (walker/cane) Does the patient have difficulty walking or climbing stairs?: Yes Weakness of Legs: Both Weakness of Arms/Hands: None  Permission Sought/Granted   Permission granted to share information with : Yes, Verbal Permission Granted              Emotional Assessment Appearance:: Appears stated age Attitude/Demeanor/Rapport: Engaged Affect (typically observed): Stable Orientation: : Oriented to Self, Oriented to Place      Admission diagnosis:  Weakness [R53.1] Right hip pain [M25.551] Urinary tract infection without hematuria, site unspecified [N39.0] Generalized weakness [R53.1] Patient Active Problem List   Diagnosis Date Noted  . Malnutrition of moderate degree 07/19/2020  . Weakness 07/18/2020  . Generalized weakness 07/18/2020  . Acute lower UTI 07/18/2020  .  Fall at home, initial encounter 07/18/2020  . Hypokalemia 07/18/2020  . Avascular necrosis of femoral head ([place]) 07/18/2020  . Essential hypertension 07/18/2020  . Injury of toe on right foot 11/07/2013  . Edema of toe 11/07/2013  . Pain in toe of right foot 11/07/2013   PCP:  Sandrea Hughs,  NP Pharmacy:   [place], [place] [address] Phone: [phone] Fax: [phone]     Social Determinants of Health (SDOH) Interventions    Readmission Risk Interventions No flowsheet data found.

## 2020-07-20 ENCOUNTER — Telehealth: Payer: Self-pay

## 2020-07-20 DIAGNOSIS — R531 Weakness: Secondary | ICD-10-CM

## 2020-07-20 LAB — BASIC METABOLIC PANEL
Anion gap: 10 (ref 5–15)
BUN: 15 mg/dL (ref 8–23)
CO2: 26 mmol/L (ref 22–32)
Calcium: 9.2 mg/dL (ref 8.9–10.3)
Chloride: 106 mmol/L (ref 98–111)
Creatinine, Ser: 0.91 mg/dL (ref 0.44–1.00)
GFR calc Af Amer: >60 mL/min (ref >60)
GFR calc non Af Amer: 60 mL/min — ABNORMAL LOW (ref >60)
Glucose, Bld: 86 mg/dL (ref 70–99)
Potassium: 4.2 mmol/L (ref 3.5–5.1)
Sodium: 142 mmol/L (ref 135–145)

## 2020-07-20 LAB — MAGNESIUM: Magnesium: 1.6 mg/dL — ABNORMAL LOW (ref 1.7–2.4)

## 2020-07-20 MED ORDER — MAGNESIUM SULFATE 2 GM/50ML IV SOLN
2.0000 g | Freq: Once | INTRAVENOUS | Status: AC
  Administered 2020-07-20: 2 g via INTRAVENOUS
  Filled 2020-07-20: qty 50

## 2020-07-20 NOTE — TOC Transition Note (Addendum)
Transition of Care [facility]) - CM/SW Discharge Note   Patient Details  Name: Christina Perkins MRN: [id number] Date of Birth: 1941/09/27  Transition of Care [facility]) CM/SW Contact:  Bartholome Bill, RN Phone Number: 07/20/2020, 9:44 AM   Clinical Narrative:    This CM contacted daughter Christina Perkins to follow up on dc plans and give SNF bed offers. Christina Perkins states that she has spoken with her siblings and they have decided to take pt home and one of them will stay with her around the clock. This CM then offered choice for home health services and Christina Perkins was chosen. [facility] liaison contacted for referral. Will need MD orders for HHPT/OT/Aide.   Final next level of care: Home w Home Health Services Barriers to Discharge: Continued Medical Work up   Patient Goals and CMS Choice Patient states their goals for this hospitalization and ongoing recovery are:: To get home     Discharge Plan and Services   Discharge Planning Services: CM Consult               3in1 ordered from [employer]       HH Arranged: PT, OT, Nurse's Aide HH Agency: [facility] Date [facility] Agency Contacted: 07/20/20 Time HH Agency Contacted: [phone] Representative spoke with at [facility] Agency: Christina Perkins  Social Determinants of Health (SDOH) Interventions     Readmission Risk Interventions No flowsheet data found.

## 2020-07-20 NOTE — Telephone Encounter (Addendum)
Transition Care Management Follow-Up Telephone Call   Date discharged and where: [facility] 07/20/2020  How have you been since you were released from the hospital? Patient feels better  Any patient concerns? No   Items Reviewed:   Meds: Yes  Allergies: Yes  Dietary Changes Reviewed: Yes  Functional Questionnaire:  Independent-I Dependent-D  ADLs:   Dressing- I with assistance if needed     Eating- I   Maintaining continence- I, with walker    Transferring- I, with walker    Transportation- D, daughter provides transportation    Meal Prep- D, family members    Managing Meds- D, daughter manages   Confirmed importance and Date/Time of follow-up visits scheduled: Yes, 07/27/2020 @ 2:15 pm    Confirmed with patient if condition worsens to call PCP or go to the Emergency Dept. Patient was given office number and encouraged to call back with questions or concerns:  Yes

## 2020-07-20 NOTE — Care Management Important Message (Signed)
Important Message  Patient Details IM Letter given to the Patient Name: Christina Perkins MRN: [id number] Date of Birth: 1941/01/20   Medicare Important Message Given:  Yes     Caren Macadam 07/20/2020, 9:47 AM

## 2020-07-20 NOTE — Discharge Instructions (Signed)
Weakness Weakness is a lack of strength. You may feel weak all over your body (generalized), or you may feel weak in one specific part of your body (focal). Common causes of weakness include:  Infection and immune system disorders.  Physical exhaustion.  Internal bleeding or other blood loss that results in a lack of red blood cells (anemia).  Dehydration.  An imbalance in mineral (electrolyte) levels, such as potassium.  Heart disease, circulation problems, or stroke. Other causes include:  Some medicines or cancer treatment.  Stress, anxiety, or depression.  Nervous system disorders.  Thyroid disorders.  Loss of muscle strength because of age or inactivity.  Poor sleep quality or sleep disorders. The cause of your weakness may not be known. Some causes of weakness can be serious, so it is important to see your health care provider. Follow these instructions at home: Activity  Rest as needed.  Try to get enough sleep. Most adults need 7-8 hours of quality sleep each night. Talk to your health care provider about how much sleep you need each night.  Do exercises, such as arm curls and leg raises, for 30 minutes at least 2 days a week or as told by your health care provider. This helps build muscle strength.  Consider working with a physical therapist or trainer who can develop an exercise plan to help you gain muscle strength. General instructions   Take over-the-counter and prescription medicines only as told by your health care provider.  Eat a healthy, well-balanced diet. This includes: ? Proteins to build muscles, such as lean meats and fish. ? Fresh fruits and vegetables. ? Carbohydrates to boost energy, such as whole grains.  Drink enough fluid to keep your urine pale yellow.  Keep all follow-up visits as told by your health care provider. This is important. Contact a health care provider if your weakness:  Does not improve or gets worse.  Affects your  ability to think clearly.  Affects your ability to do your normal daily activities. Get help right away if you:  Develop sudden weakness, especially on one side of your face or body.  Have chest pain.  Have trouble breathing or shortness of breath.  Have problems with your vision.  Have trouble talking or swallowing.  Have trouble standing or walking.  Are light-headed or lose consciousness. Summary  Weakness is a lack of strength. You may feel weak all over your body or just in one specific part of your body.  Weakness can be caused by a variety of things. In some cases, the cause may be unknown.  Rest as needed, and try to get enough sleep. Most adults need 7-8 hours of quality sleep each night.  Eat a healthy, well-balanced diet. This information is not intended to replace advice given to you by your health care provider. Make sure you discuss any questions you have with your health care provider. Document Revised: 06/09/2018 Document Reviewed: 06/09/2018 Elsevier Patient Education  2020 Elsevier Inc.  

## 2020-07-20 NOTE — Discharge Summary (Signed)
Physician Discharge Summary  Christina Perkins KKX:[id number] DOB: 08/15/1941 DOA: 07/17/2020  PCP: Caesar Bookman, NP  Admit date: 07/17/2020 Discharge date: 07/20/2020  Admitted From: Home Disposition: Home  Recommendations for Outpatient Follow-up:  1. Follow up with PCP in 1-2 weeks 2. Please obtain BMP/CBC in one week 3. Please follow up with your PCP on the following pending results: Unresulted Labs (From admission, onward)         None       Home Health: Yes Equipment/Devices: Commode  Discharge Condition: Stable CODE STATUS: Full code Diet recommendation: Regular  Subjective: Seen and examined.  Son at the bedside.  She had no complaints.  HPI: Christina Perkins is an 79 year old female with a history of HTN, HLD, Alzheimer's disease,MDR E coli UTI,alcohol use who recently establish care with her PCP on 8/17 and reported failure to thrive type symptoms at the time (abnormal weight loss, loss of appetite) whose family called her PCP on 8/31 due to patient not able to get out of bed due to severe dizziness and fear [facility], diagnosed with a UTI and transferred to [facility] for [facility].  Son states that the patient had a fall a few days ago. Patient does not recall the events leading up to it but denies LOC. Does not remember much of the remaining details. Patient has bilateral hip pain R>L.  ED Course:T 97.6, P 86, RR 16, BP 141/71.Notable Labs: K 3.0, BUN 27, Cr 1.31 (1.07 on 8/17), WBC 10, UA positive for UTI. COVID 19 negative.Lumbar spine x-ray: Chronic issues noted, no acute pathology; Hip XR: Bilateral femoral head avascular necrosis without subchondral collapse, moderate bilateral hip arthritis R>L. CT head: chronic small vessel ischemia, no acute changes. She was given 500 mL NS bolus x 1 and KCl 40 mEq.  Brief/Interim Summary: Patient was admitted under hospital service secondary to generalized weakness, fall and UTI.  She was started on Rocephin.  Her  urine culture grew multiple organisms, consistent with possible contamination.  Patient has improved significantly as far as her strength goes in last few days.  Remains afebrile with no leukocytosis.  She was evaluated by PT OT and they recommended discharging to SNF however patient's family has decided to take her home and they have arranged 24/7 in-home care for her and we have also arranged home health for her.  She is being discharged in stable condition.  She received 3 days of IV Rocephin for her uncomplicated UTI.  Discharge Diagnoses:  Principal Problem:   Generalized weakness Active Problems:   Acute lower UTI   Fall at home, initial encounter   Hypokalemia   Avascular necrosis of femoral head (HCC)   Essential hypertension   Malnutrition of moderate degree    Discharge Instructions  Discharge Instructions    Discharge patient   Complete by: As directed    Discharge disposition: 06-Home-Health Care Svc   Discharge patient date: 07/20/2020     Allergies as of 07/20/2020   No Known Allergies     Medication List    STOP taking these medications   hydrochlorothiazide 25 MG tablet Commonly known as: HYDRODIURIL     TAKE these medications   atorvastatin 20 MG tablet Commonly known as: LIPITOR Take 20 mg by mouth at bedtime.   cholecalciferol 25 MCG (1000 UNIT) tablet Commonly known as: VITAMIN D3 Take 1,000 Units by mouth daily.   donepezil 10 MG tablet Commonly known as: ARICEPT Take 10 mg by mouth at bedtime. Before  Bedtime.   sertraline 25 MG tablet Commonly known as: ZOLOFT Take 25 mg by mouth at bedtime.            Durable Medical Equipment  (From admission, onward)         Start     Ordered   07/20/20 0955  For home use only DME Bedside commode  Once       Question:  Patient needs a bedside commode to treat with the following condition  Answer:  Balance problem   07/20/20 0956          Follow-up Information    [facility]  Follow up.   Specialty: Home Health Services Contact information: [address] [place] [address] [phone]        Ngetich, Dinah C, NP Follow up in 1 week(s).   Specialty: Family Medicine Contact information: [address] [place] [address] [phone]              No Known Allergies  Consultations: None   Procedures/Studies: DG Lumbar Spine Complete  Result Date: 07/17/2020 CLINICAL DATA:  Fall, low back pain EXAM: LUMBAR SPINE - COMPLETE 4+ VIEW COMPARISON:  01/07/2018 FINDINGS: There are 6 non rib bearing segments of the lumbar spine. The lowest segment is considered and L6 to remain consistent with prior imaging. There is normal lumbar lordosis. There is marked intervertebral disc space narrowing and grade 1 anterolisthesis of L5-L6, similar to that noted on prior examination. Vertebral body heights and remaining intervertebral disc heights are preserved. No acute fracture or traumatic listhesis of the lumbar spine. Oblique views fail to demonstrate the pars interarticularis well, but demonstrate at least moderate facet arthrosis at L5-6 bilaterally. The paraspinal soft tissues are unremarkable. IMPRESSION: 1. No acute fracture or traumatic listhesis of the lumbar spine. 2. Transitional lumbosacral anatomy with 6 non rib bearing segments of the lumbar spine. 3. Marked intervertebral disc space narrowing and grade 1 anterolisthesis of L5-L6, similar to that noted on prior examination. Electronically Signed   By: Helyn Numbers MD   On: 07/17/2020 19:58   CT Head Wo Contrast  Result Date: 07/17/2020 CLINICAL DATA:  Generalized fatigue and dizziness EXAM: CT HEAD WITHOUT CONTRAST TECHNIQUE: Contiguous axial images were obtained from the base of the skull through the vertex without intravenous contrast. COMPARISON:  January 07, 2018 FINDINGS: Brain: No evidence of acute territorial infarction, hemorrhage, hydrocephalus,extra-axial collection or mass  lesion/mass effect. There is dilatation the ventricles and sulci consistent with age-related atrophy. Low-attenuation changes in the deep white matter consistent with small vessel ischemia. Vascular: No hyperdense vessel or unexpected calcification. Skull: The skull is intact. No fracture or focal lesion identified. Sinuses/Orbits: The visualized paranasal sinuses and mastoid air cells are clear. The orbits and globes intact. Other: None IMPRESSION: No acute intracranial abnormality. Findings consistent with age related atrophy and chronic small vessel ischemia Electronically Signed   By: Jonna Clark M.D.   On: 07/17/2020 23:57   DG HIP UNILAT WITH PELVIS 2-3 VIEWS RIGHT  Result Date: 07/17/2020 CLINICAL DATA:  Low back pain, fall, right hip pain EXAM: DG HIP (WITH OR WITHOUT PELVIS) 2-3V RIGHT COMPARISON:  None. FINDINGS: Single view radiograph pelvis and two view radiograph of the a right hip demonstrates normal alignment. No fracture or dislocation. There is a mixed lytic and sclerotic pattern involving the femoral heads bilaterally in keeping with bilateral femoral head avascular necrosis without associated subchondral collapse. There is superimposed moderate bilateral degenerative  hip arthritis, right slightly more severe than left. Calcific density is seen within the soft tissues anterior to the right hip, possibly the sequela of remote trauma or inflammation. IMPRESSION: 1. Bilateral femoral head avascular necrosis without associated subchondral collapse. 2. Moderate bilateral degenerative hip arthritis, right slightly more severe than left. Electronically Signed   By: Helyn NumbersAshesh  Parikh MD   On: 07/17/2020 20:01     Discharge Exam: Vitals:   07/19/20 2037 07/20/20 0615  BP: 134/82 135/72  Pulse: 70 65  Resp: 20 20  Temp: 98.3 F (36.8 C) 98.1 F (36.7 C)  SpO2: 99% 97%   Vitals:   07/19/20 0647 07/19/20 1354 07/19/20 2037 07/20/20 0615  BP: (!) 151/76 122/69 134/82 135/72  Pulse: 68 85 70  65  Resp: 14 17 20 20   Temp: 98.3 F (36.8 C) 97.8 F (36.6 C) 98.3 F (36.8 C) 98.1 F (36.7 C)  TempSrc: Oral Oral Oral Oral  SpO2: 98% 95% 99% 97%  Weight:      Height:        General: Pt is alert, awake, not in acute distress Cardiovascular: RRR, S1/S2 +, no rubs, no gallops Respiratory: CTA bilaterally, no wheezing, no rhonchi Abdominal: Soft, NT, ND, bowel sounds + Extremities: no edema, no cyanosis    The results of significant diagnostics from this hospitalization (including imaging, microbiology, ancillary and laboratory) are listed below for reference.     Microbiology: Recent Results (from the past 240 hour(s))  SARS Coronavirus 2 by RT PCR (hospital order, performed [facility]) Nasopharyngeal Nasopharyngeal Swab     Status: None   Collection Time: 07/17/20 11:24 PM   Specimen: Nasopharyngeal Swab  Result Value Ref Range Status   SARS Coronavirus 2 NEGATIVE NEGATIVE Final    Comment: (NOTE) SARS-CoV-2 target nucleic acids are NOT DETECTED.  The SARS-CoV-2 RNA is generally detectable in upper and lower respiratory specimens during the acute phase of infection. The lowest concentration of SARS-CoV-2 viral copies this assay can detect is 250 copies / mL. A negative result does not preclude SARS-CoV-2 infection and should not be used as the sole basis for treatment or other patient management decisions.  A negative result may occur with improper specimen collection / handling, submission of specimen other than nasopharyngeal swab, presence of viral mutation(s) within the areas targeted by this assay, and inadequate number of viral copies (<250 copies / mL). A negative result must be combined with clinical observations, patient history, and epidemiological information.  Fact Sheet for Patients:   BoilerBrush.com.cyhttps://www.fda.gov/media/136312/download  Fact Sheet for Healthcare Providers: https://pope.com/https://www.fda.gov/media/136313/download  This test is not yet  approved or  cleared by the [place] FDA and has been authorized for detection and/or diagnosis of SARS-CoV-2 by FDA under an Emergency Use Authorization (EUA).  This EUA will remain in effect (meaning this test can be used) for the duration of the COVID-19 declaration under Section 564(b)(1) of the Act, 21 U.S.C. section 360bbb-3(b)(1), unless the authorization is terminated or revoked sooner.  Performed at [facility], [address] [place], [place] [address]   Urine culture     Status: Abnormal   Collection Time: 07/18/20  2:00 PM   Specimen: Urine, Random  Result Value Ref Range Status   Specimen Description   Final    URINE, RANDOM Performed at [facility], [address] [place], [place] [address]    Special Requests   Final    NONE Performed at [facility], [address], [place], [place] [address]    Culture MULTIPLE SPECIES PRESENT, SUGGEST RECOLLECTION (A)  Final   Report Status 07/19/2020 FINAL  Final     Labs: BNP (last 3 results) No results for input(s): BNP in the last 8760 hours. Basic Metabolic Panel: Recent Labs  Lab 07/17/20 1923 07/18/20 1136 07/19/20 0554 07/20/20 0546  NA 137  --  139 142  K 3.0*  --  2.9* 4.2  CL 98  --  102 106  CO2 26  --  27 26  GLUCOSE 110*  --  91 86  BUN 27*  --  20 15  CREATININE 1.31* 1.10* 0.96 0.91  CALCIUM 9.5  --  9.0 9.2  MG  --   --   --  1.6*   Liver Function Tests: Recent Labs  Lab 07/17/20 1923  AST 17  ALT 10  ALKPHOS 70  BILITOT 0.6  PROT 7.6  ALBUMIN 4.2   No results for input(s): LIPASE, AMYLASE in the last 168 hours. No results for input(s): AMMONIA in the last 168 hours. CBC: Recent Labs  Lab 07/17/20 1923 07/18/20 1136 07/19/20 0554  WBC 10.0 11.0* 7.5  NEUTROABS 7.3  --   --   HGB 14.7 14.3 12.2  HCT 43.8 42.0 36.5  MCV 89.6 89.9 91.3  PLT 358 327 272   Cardiac Enzymes: No results for input(s): CKTOTAL, CKMB, CKMBINDEX,  TROPONINI in the last 168 hours. BNP: Invalid input(s): POCBNP CBG: No results for input(s): GLUCAP in the last 168 hours. D-Dimer No results for input(s): DDIMER in the last 72 hours. Hgb A1c No results for input(s): HGBA1C in the last 72 hours. Lipid Profile No results for input(s): CHOL, HDL, LDLCALC, TRIG, CHOLHDL, LDLDIRECT in the last 72 hours. Thyroid function studies No results for input(s): TSH, T4TOTAL, T3FREE, THYROIDAB in the last 72 hours.  Invalid input(s): FREET3 Anemia work up No results for input(s): VITAMINB12, FOLATE, FERRITIN, TIBC, IRON, RETICCTPCT in the last 72 hours. Urinalysis    Component Value Date/Time   COLORURINE YELLOW 07/17/2020 2331   APPEARANCEUR CLOUDY (A) 07/17/2020 2331   LABSPEC >1.030 (H) 07/17/2020 2331   PHURINE 5.5 07/17/2020 2331   GLUCOSEU NEGATIVE 07/17/2020 2331   HGBUR NEGATIVE 07/17/2020 2331   BILIRUBINUR MODERATE (A) 07/17/2020 2331   KETONESUR NEGATIVE 07/17/2020 2331   PROTEINUR NEGATIVE 07/17/2020 2331   NITRITE NEGATIVE 07/17/2020 2331   LEUKOCYTESUR TRACE (A) 07/17/2020 2331   Sepsis Labs Invalid input(s): PROCALCITONIN,  WBC,  LACTICIDVEN Microbiology Recent Results (from the past 240 hour(s))  SARS Coronavirus 2 by RT PCR (hospital order, performed in [facility] hospital lab) Nasopharyngeal Nasopharyngeal Swab     Status: None   Collection Time: 07/17/20 11:24 PM   Specimen: Nasopharyngeal Swab  Result Value Ref Range Status   SARS Coronavirus 2 NEGATIVE NEGATIVE Final    Comment: (NOTE) SARS-CoV-2 target nucleic acids are NOT DETECTED.  The SARS-CoV-2 RNA is generally detectable in upper and lower respiratory specimens during the acute phase of infection. The lowest concentration of SARS-CoV-2 viral copies this assay can detect is 250 copies / mL. A negative result does not preclude SARS-CoV-2 infection and should not be used as the sole basis for treatment or other patient management decisions.  A negative  result may occur with improper specimen collection / handling, submission of specimen other than nasopharyngeal swab, presence of viral mutation(s) within the areas targeted by this assay, and inadequate number of viral copies (<250  copies / mL). A negative result must be combined with clinical observations, patient history, and epidemiological information.  Fact Sheet for Patients:   BoilerBrush.com.cy  Fact Sheet for Healthcare Providers: https://pope.com/  This test is not yet approved or  cleared by the [place] FDA and has been authorized for detection and/or diagnosis of SARS-CoV-2 by FDA under an Emergency Use Authorization (EUA).  This EUA will remain in effect (meaning this test can be used) for the duration of the COVID-19 declaration under Section 564(b)(1) of the Act, 21 U.S.C. section 360bbb-3(b)(1), unless the authorization is terminated or revoked sooner.  Performed at [facility], [address] [place], [place] [address]   Urine culture     Status: Abnormal   Collection Time: 07/18/20  2:00 PM   Specimen: Urine, Random  Result Value Ref Range Status   Specimen Description   Final    URINE, RANDOM Performed at [facility], [address] [place], [place] [address]    Special Requests   Final    NONE Performed at [facility], [address] [place], [place] [address]    Culture MULTIPLE SPECIES PRESENT, SUGGEST RECOLLECTION (A)  Final   Report Status 07/19/2020 FINAL  Final     Time coordinating discharge: Over 30 minutes  SIGNED:   Hughie Closs, MD  [facility] 07/20/2020, 11:44 AM  If 7PM-7AM, please contact night-coverage [email or web address]

## 2020-07-21 DIAGNOSIS — M87851 Other osteonecrosis, right femur: Secondary | ICD-10-CM

## 2020-07-21 DIAGNOSIS — G309 Alzheimer's disease, unspecified: Secondary | ICD-10-CM

## 2020-07-21 DIAGNOSIS — M47816 Spondylosis without myelopathy or radiculopathy, lumbar region: Secondary | ICD-10-CM

## 2020-07-21 DIAGNOSIS — M4056 Lordosis, unspecified, lumbar region: Secondary | ICD-10-CM

## 2020-07-21 DIAGNOSIS — M87852 Other osteonecrosis, left femur: Secondary | ICD-10-CM

## 2020-07-21 DIAGNOSIS — M160 Bilateral primary osteoarthritis of hip: Secondary | ICD-10-CM

## 2020-07-21 DIAGNOSIS — M48061 Spinal stenosis, lumbar region without neurogenic claudication: Secondary | ICD-10-CM

## 2020-07-21 DIAGNOSIS — M4316 Spondylolisthesis, lumbar region: Secondary | ICD-10-CM

## 2020-07-27 ENCOUNTER — Ambulatory Visit (INDEPENDENT_AMBULATORY_CARE_PROVIDER_SITE_OTHER): Payer: Medicare Other | Admitting: Family

## 2020-07-27 ENCOUNTER — Encounter: Payer: Self-pay | Admitting: Family

## 2020-07-27 ENCOUNTER — Other Ambulatory Visit: Payer: Self-pay

## 2020-07-27 VITALS — BP 120/70 | HR 77 | Temp 97.3°F | Resp 16 | Ht 66.0 in | Wt 141.6 lb

## 2020-07-27 DIAGNOSIS — E876 Hypokalemia: Secondary | ICD-10-CM | POA: Diagnosis not present

## 2020-07-27 DIAGNOSIS — I10 Essential (primary) hypertension: Secondary | ICD-10-CM

## 2020-07-27 DIAGNOSIS — E785 Hyperlipidemia, unspecified: Secondary | ICD-10-CM

## 2020-07-27 DIAGNOSIS — M25552 Pain in left hip: Secondary | ICD-10-CM

## 2020-07-27 DIAGNOSIS — Z789 Other specified health status: Secondary | ICD-10-CM

## 2020-07-27 DIAGNOSIS — F0280 Dementia in other diseases classified elsewhere without behavioral disturbance: Secondary | ICD-10-CM

## 2020-07-27 DIAGNOSIS — R531 Weakness: Secondary | ICD-10-CM

## 2020-07-27 DIAGNOSIS — R2681 Unsteadiness on feet: Secondary | ICD-10-CM | POA: Diagnosis not present

## 2020-07-27 DIAGNOSIS — G301 Alzheimer's disease with late onset: Secondary | ICD-10-CM

## 2020-07-27 DIAGNOSIS — G8929 Other chronic pain: Secondary | ICD-10-CM

## 2020-07-27 MED ORDER — ACETAMINOPHEN 500 MG PO TABS: 1000.0000 mg | ORAL_TABLET | Freq: Two times a day (BID) | ORAL | 0 refills | Status: DC

## 2020-07-27 NOTE — Progress Notes (Signed)
Provider: Richarda Blade FNP-C  Jerrilynn Mikowski, Donalee Citrin, NP  Patient Care Team: Davy Faught, Donalee Citrin, NP as PCP - General (Family Medicine)  Extended Emergency Contact Information Primary Emergency Contact: Prescott Parma Mobile Phone: [phone] Relation: Daughter Secondary Emergency Contact: Tawny Asal [place] Home Phone: [phone] Relation: Daughter  Code Status:  Full Code  Goals of care: Advanced Directive information Advanced Directives 07/27/2020  Does Patient Have a Medical Advance Directive? Yes  Type of Advance Directive Living will;Healthcare Power of Attorney  Does patient want to make changes to medical advance directive? No - Patient declined  Copy of Healthcare Power of Attorney in Chart? Yes - validated most recent copy scanned in chart (See row information)  Would patient like information on creating a medical advance directive? -     Chief Complaint  Patient presents with  . Transitions Of Care    Hospital Follow Up from 07/17/2020-07/20/2020    HPI:  Pt is a 79 y.o. female seen today for an acute visit for follow up Hospital admission  from 07/17/2020-07/20/2020 for urinary tract infection after she was unable to get out bed due to severe dizziness and fear of falling.she had had a fall the day before hospital.she was treated for UTI .WBC 10 ,K 3.0 BUN 27,CR 1.31.Her COVID test was negative right hip X-ray showed bilateral femoral head avascular necrosis without subchondral collapse,moderate hips arthritis L> R head. chronic small vessel ischemia and no acute changes.IVF NS 500 cc bolus and KCL 40 meq was given.Also treated with IV Rocephin x 3 days urine was suggestive of contamination due to multiple organisms noted.condirion improved and showed no leukocytosis.SNF was recommended but POA decided to take her home with 24/7 in home care for her with home health. She is here with her daughter.states continues to work with Physical therapy at home.she  feeling much better.Has had no dizziness.she wakes up later around 10 am and does not eat breakfast.sometimes eats two meals but most of the time it's one meal.Daughter has a niece that stays with patient.  She has had no fever,chills,cough or symptoms of UTI  No fall episode.  Medication Reviewed.      Past Medical History:  Diagnosis Date  . Dementia (HCC)   . H/O mammogram 2019   Per [facility] new patient packet  . Hx of colonoscopy 2016   Per [facility] new patient packet  . Hypertension    History reviewed. No pertinent surgical history.  No Known Allergies  Outpatient Encounter Medications as of 07/27/2020  Medication Sig  . atorvastatin (LIPITOR) 20 MG tablet Take 20 mg by mouth at bedtime.   . cholecalciferol (VITAMIN D3) 25 MCG (1000 UNIT) tablet Take 1,000 Units by mouth daily.  Marland Kitchen donepezil (ARICEPT) 10 MG tablet Take 10 mg by mouth at bedtime. Before Bedtime.  . sertraline (ZOLOFT) 25 MG tablet Take 25 mg by mouth at bedtime.   No facility-administered encounter medications on file as of 07/27/2020.    Review of Systems  Constitutional: Negative for appetite change, chills, fatigue and fever.  HENT: Positive for congestion and rhinorrhea. Negative for postnasal drip, sinus pressure, sinus pain, sneezing and sore throat.        Chronic nasal congestion and runny nose using Afrin,she was prescribed Flonase but daughter states couldn't find it in [place]. So she bought Afrin  Eyes: Negative for discharge and redness.  Respiratory: Negative for cough, chest tightness, shortness of breath and wheezing.   Cardiovascular: Negative for chest pain,  palpitations and leg swelling.  Gastrointestinal: Negative for abdominal distention, abdominal pain, constipation, diarrhea, nausea and vomiting.  Endocrine: Negative for cold intolerance, heat intolerance, polydipsia, polyphagia and polyuria.  Genitourinary: Negative for difficulty urinating, dysuria, enuresis, flank pain, frequency and  urgency.  Musculoskeletal: Positive for gait problem. Negative for arthralgias.  Skin: Negative for color change, pallor and rash.  Neurological: Negative for dizziness, speech difficulty, weakness, light-headedness, numbness and headaches.  Hematological: Does not bruise/bleed easily.  Psychiatric/Behavioral: Negative for agitation, confusion and sleep disturbance. The patient is not nervous/anxious.     Immunization History  Administered Date(s) Administered  . PFIZER SARS-COV-2 Vaccination 12/29/2019, 01/23/2020   Pertinent  Health Maintenance Due  Topic Date Due  . DEXA SCAN  Never done  . PNA vac Low Risk Adult (1 of 2 - PCV13) Never done  . INFLUENZA VACCINE  Never done   Fall Risk  07/27/2020 07/03/2020  Falls in the past year? 1 0  Number falls in past yr: 0 0  Injury with Fall? 0 0    Vitals:   07/27/20 1408  BP: 120/70  Pulse: 77  Resp: 16  Temp: (!) 97.3 F (36.3 C)  SpO2: 96%  Weight: 141 lb 9.6 oz (64.2 kg)  Height: $Remove'5\' 6"'KdLNlwb$  (1.676 m)   Body mass index is 22.85 kg/m. Physical Exam Vitals reviewed.  Constitutional:      General: She is not in acute distress.    Appearance: She is normal weight. She is not ill-appearing.  HENT:     Head: Normocephalic.  Eyes:     General: No scleral icterus.       Right eye: No discharge.        Left eye: No discharge.     Conjunctiva/sclera: Conjunctivae normal.     Pupils: Pupils are equal, round, and reactive to light.  Cardiovascular:     Rate and Rhythm: Normal rate and regular rhythm.     Pulses: Normal pulses.     Heart sounds: Normal heart sounds. No murmur heard.  No friction rub. No gallop.   Pulmonary:     Effort: Pulmonary effort is normal. No respiratory distress.     Breath sounds: Normal breath sounds. No wheezing, rhonchi or rales.  Chest:     Chest wall: No tenderness.  Abdominal:     General: Bowel sounds are normal. There is no distension.     Palpations: Abdomen is soft. There is no mass.      Tenderness: There is no abdominal tenderness. There is no right CVA tenderness, left CVA tenderness, guarding or rebound.  Musculoskeletal:        General: No swelling or tenderness.     Right lower leg: No edema.     Left lower leg: No edema.     Comments: Unsteady gait   Skin:    General: Skin is warm.     Coloration: Skin is not pale.     Findings: No bruising, erythema or rash.  Neurological:     Mental Status: She is alert. Mental status is at baseline.     Cranial Nerves: No cranial nerve deficit.     Sensory: No sensory deficit.     Motor: No weakness.     Coordination: Coordination normal.     Gait: Gait abnormal.  Psychiatric:        Mood and Affect: Mood normal.        Speech: Speech normal.        Behavior: Behavior  normal.        Thought Content: Thought content normal.        Judgment: Judgment normal.    Labs reviewed: Recent Labs    07/17/20 1923 07/17/20 1923 07/18/20 1136 07/19/20 0554 07/20/20 0546  NA 137  --   --  139 142  K 3.0*  --   --  2.9* 4.2  CL 98  --   --  102 106  CO2 26  --   --  27 26  GLUCOSE 110*  --   --  91 86  BUN 27*  --   --  20 15  CREATININE 1.31*   < > 1.10* 0.96 0.91  CALCIUM 9.5  --   --  9.0 9.2  MG  --   --   --   --  1.6*   < > = values in this interval not displayed.   Recent Labs    07/03/20 1343 07/17/20 1923  AST 12 17  ALT 9 10  ALKPHOS  --  70  BILITOT 0.6 0.6  PROT 6.6 7.6  ALBUMIN  --  4.2   Recent Labs    07/03/20 1343 07/03/20 1343 07/17/20 1923 07/18/20 1136 07/19/20 0554  WBC 9.3   < > 10.0 11.0* 7.5  NEUTROABS 6,733  --  7.3  --   --   HGB 14.5   < > 14.7 14.3 12.2  HCT 43.2   < > 43.8 42.0 36.5  MCV 91.9   < > 89.6 89.9 91.3  PLT 291   < > 358 327 272   < > = values in this interval not displayed.   Lab Results  Component Value Date   TSH 0.56 07/03/2020   No results found for: HGBA1C Lab Results  Component Value Date   CHOL 159 07/03/2020   HDL 45 (L) 07/03/2020   LDLCALC 88  07/03/2020   TRIG 162 (H) 07/03/2020   CHOLHDL 3.5 07/03/2020    Significant Diagnostic Results in last 30 days:  DG Lumbar Spine Complete  Result Date: 07/17/2020 CLINICAL DATA:  Fall, low back pain EXAM: LUMBAR SPINE - COMPLETE 4+ VIEW COMPARISON:  01/07/2018 FINDINGS: There are 6 non rib bearing segments of the lumbar spine. The lowest segment is considered and L6 to remain consistent with prior imaging. There is normal lumbar lordosis. There is marked intervertebral disc space narrowing and grade 1 anterolisthesis of L5-L6, similar to that noted on prior examination. Vertebral body heights and remaining intervertebral disc heights are preserved. No acute fracture or traumatic listhesis of the lumbar spine. Oblique views fail to demonstrate the pars interarticularis well, but demonstrate at least moderate facet arthrosis at L5-6 bilaterally. The paraspinal soft tissues are unremarkable. IMPRESSION: 1. No acute fracture or traumatic listhesis of the lumbar spine. 2. Transitional lumbosacral anatomy with 6 non rib bearing segments of the lumbar spine. 3. Marked intervertebral disc space narrowing and grade 1 anterolisthesis of L5-L6, similar to that noted on prior examination. Electronically Signed   By: Fidela Salisbury MD   On: 07/17/2020 19:58   CT Head Wo Contrast  Result Date: 07/17/2020 CLINICAL DATA:  Generalized fatigue and dizziness EXAM: CT HEAD WITHOUT CONTRAST TECHNIQUE: Contiguous axial images were obtained from the base of the skull through the vertex without intravenous contrast. COMPARISON:  January 07, 2018 FINDINGS: Brain: No evidence of acute territorial infarction, hemorrhage, hydrocephalus,extra-axial collection or mass lesion/mass effect. There is dilatation the ventricles and sulci consistent with age-related  atrophy. Low-attenuation changes in the deep white matter consistent with small vessel ischemia. Vascular: No hyperdense vessel or unexpected calcification. Skull: The skull  is intact. No fracture or focal lesion identified. Sinuses/Orbits: The visualized paranasal sinuses and mastoid air cells are clear. The orbits and globes intact. Other: None IMPRESSION: No acute intracranial abnormality. Findings consistent with age related atrophy and chronic small vessel ischemia Electronically Signed   By: Prudencio Pair M.D.   On: 07/17/2020 23:57   DG HIP UNILAT WITH PELVIS 2-3 VIEWS RIGHT  Result Date: 07/17/2020 CLINICAL DATA:  Low back pain, fall, right hip pain EXAM: DG HIP (WITH OR WITHOUT PELVIS) 2-3V RIGHT COMPARISON:  None. FINDINGS: Single view radiograph pelvis and two view radiograph of the a right hip demonstrates normal alignment. No fracture or dislocation. There is a mixed lytic and sclerotic pattern involving the femoral heads bilaterally in keeping with bilateral femoral head avascular necrosis without associated subchondral collapse. There is superimposed moderate bilateral degenerative hip arthritis, right slightly more severe than left. Calcific density is seen within the soft tissues anterior to the right hip, possibly the sequela of remote trauma or inflammation. IMPRESSION: 1. Bilateral femoral head avascular necrosis without associated subchondral collapse. 2. Moderate bilateral degenerative hip arthritis, right slightly more severe than left. Electronically Signed   By: Fidela Salisbury MD   On: 07/17/2020 20:01    Assessment/Plan 1. Chronic left hip pain Left worst than the right.Her hospital X-ray  bilateral femoral head avascular necrosis without subchondral collapse,moderate hips arthritis L> R head  - advised daughter to give Extra strength Tylenol 1000 mg tablet twice daily in the morning and bedtime.  - Ambulatory referral to Orthopedic Surgery - CBC with Differential/Platelet - acetaminophen (TYLENOL) 500 MG tablet; Take 2 tablets (1,000 mg total) by mouth in the morning and at bedtime.  Dispense: 30 tablet; Refill: 0  2. Unsteady gait No recent  fall episode.Ambulates with Rolling walker - CBC with Differential/Platelet  3. Hypokalemia K+ was 3.0 replete. Will recheck lab work. - CMP with eGFR([facility])  4. Generalized weakness  Has improved post hospital admission.  - CBC with Differential/Platelet  5. Essential hypertension B/p at goal. Currently no meds.  6. Late onset Alzheimer's disease without behavioral disturbance ([place]) No new behavioral issues.At her baseline. Continue on sertraline 25 mg tablet at bedtime and Aricept 10 mg tablet at bedtime.  - continue with supportive care   7. Hyperlipidemia LDL goal <100 LDL at goal. - continue on atorvastatin 20 mg tablet at bedtime.  8. Full Code  Code status full code   Family/ staff Communication: Reviewed plan of care with patient and daughter   Labs/tests ordered:  - CMP with eGFR([facility]) - CBC with Differential/Platelet  Next Appointment: As needed if symptoms worsen or fail to improve.   Sandrea Hughs, NP

## 2020-07-27 NOTE — Patient Instructions (Addendum)
Referral order today for Orthopedic specialist office will call you to make appointment  - Labs drawn today will call you with result. - take Extra strength tylenol 1000 mg twice daily in the morning and at bedtime for hip pain

## 2020-07-28 LAB — COMPLETE METABOLIC PANEL WITH GFR
AG Ratio: 1.8 (calc) (ref 1.0–2.5)
ALT: 15 U/L (ref 6–29)
AST: 15 U/L (ref 10–35)
Albumin: 3.8 g/dL (ref 3.6–5.1)
Alkaline phosphatase (APISO): 75 U/L (ref 37–153)
BUN/Creatinine Ratio: 9 (calc) (ref 6–22)
BUN: 9 mg/dL (ref 7–25)
CO2: 24 mmol/L (ref 20–32)
Calcium: 9.2 mg/dL (ref 8.6–10.4)
Chloride: 110 mmol/L (ref 98–110)
Creat: 1 mg/dL — ABNORMAL HIGH (ref 0.60–0.88)
GFR, Est African American: 62 mL/min/{1.73_m2} (ref >=60)
GFR, Est Non African American: 53 mL/min/{1.73_m2} — ABNORMAL LOW (ref >=60)
Globulin: 2.1 g/dL (calc) (ref 1.9–3.7)
Glucose, Bld: 90 mg/dL (ref 65–139)
Potassium: 3.8 mmol/L (ref 3.5–5.3)
Sodium: 144 mmol/L (ref 135–146)
Total Bilirubin: 0.5 mg/dL (ref 0.2–1.2)
Total Protein: 5.9 g/dL — ABNORMAL LOW (ref 6.1–8.1)

## 2020-07-28 LAB — CBC WITH DIFFERENTIAL/PLATELET
Absolute Monocytes: 680 cells/uL (ref 200–950)
Basophils Absolute: 30 cells/uL (ref 0–200)
Basophils Relative: 0.3 %
Eosinophils Absolute: 60 cells/uL (ref 15–500)
Eosinophils Relative: 0.6 %
HCT: 40.5 % (ref 35.0–45.0)
Hemoglobin: 13.2 g/dL (ref 11.7–15.5)
Lymphs Abs: 1460 cells/uL (ref 850–3900)
MCH: 30.3 pg (ref 27.0–33.0)
MCHC: 32.6 g/dL (ref 32.0–36.0)
MCV: 93.1 fL (ref 80.0–100.0)
MPV: 10 fL (ref 7.5–12.5)
Monocytes Relative: 6.8 %
Neutro Abs: 7770 cells/uL (ref 1500–7800)
Neutrophils Relative %: 77.7 %
Platelets: 316 10*3/uL (ref 140–400)
RBC: 4.35 10*6/uL (ref 3.80–5.10)
RDW: 12 % (ref 11.0–15.0)
Total Lymphocyte: 14.6 %
WBC: 10 10*3/uL (ref 3.8–10.8)

## 2020-08-28 DIAGNOSIS — G301 Alzheimer's disease with late onset: Secondary | ICD-10-CM | POA: Diagnosis not present

## 2020-08-28 DIAGNOSIS — F0280 Dementia in other diseases classified elsewhere without behavioral disturbance: Secondary | ICD-10-CM | POA: Diagnosis present

## 2020-08-28 DIAGNOSIS — R42 Dizziness and giddiness: Secondary | ICD-10-CM | POA: Diagnosis not present

## 2020-09-21 DIAGNOSIS — B351 Tinea unguium: Secondary | ICD-10-CM | POA: Diagnosis not present

## 2020-09-21 DIAGNOSIS — M79675 Pain in left toe(s): Secondary | ICD-10-CM | POA: Diagnosis not present

## 2020-09-21 DIAGNOSIS — M79674 Pain in right toe(s): Secondary | ICD-10-CM | POA: Diagnosis not present

## 2020-09-21 DIAGNOSIS — M2012 Hallux valgus (acquired), left foot: Secondary | ICD-10-CM | POA: Diagnosis not present

## 2020-10-02 DIAGNOSIS — G301 Alzheimer's disease with late onset: Secondary | ICD-10-CM | POA: Diagnosis not present

## 2020-10-17 ENCOUNTER — Encounter: Payer: Self-pay | Admitting: Family

## 2020-10-17 ENCOUNTER — Ambulatory Visit (INDEPENDENT_AMBULATORY_CARE_PROVIDER_SITE_OTHER): Payer: Medicare Other | Admitting: Family

## 2020-10-17 ENCOUNTER — Other Ambulatory Visit: Payer: Self-pay

## 2020-10-17 VITALS — BP 100/80 | HR 96 | Temp 98.0°F | Resp 16 | Ht 66.0 in | Wt 132.2 lb

## 2020-10-17 DIAGNOSIS — R634 Abnormal weight loss: Secondary | ICD-10-CM

## 2020-10-17 DIAGNOSIS — E785 Hyperlipidemia, unspecified: Secondary | ICD-10-CM | POA: Diagnosis not present

## 2020-10-17 DIAGNOSIS — G301 Alzheimer's disease with late onset: Secondary | ICD-10-CM | POA: Diagnosis not present

## 2020-10-17 DIAGNOSIS — R399 Unspecified symptoms and signs involving the genitourinary system: Secondary | ICD-10-CM

## 2020-10-17 DIAGNOSIS — F0280 Dementia in other diseases classified elsewhere without behavioral disturbance: Secondary | ICD-10-CM

## 2020-10-17 LAB — POCT URINALYSIS DIPSTICK
Bilirubin, UA: POSITIVE
Blood, UA: POSITIVE
Glucose, UA: POSITIVE — AB
Ketones, UA: POSITIVE
Nitrite, UA: NEGATIVE
Protein, UA: POSITIVE — AB
Spec Grav, UA: >=1.03 — AB (ref 1.010–1.025)
Urobilinogen, UA: 4 E.U./dL — AB
pH, UA: 6 (ref 5.0–8.0)

## 2020-10-17 MED ORDER — ATORVASTATIN CALCIUM 10 MG PO TABS: 10.0000 mg | ORAL_TABLET | Freq: Every day | ORAL | 3 refills | Status: DC

## 2020-10-17 MED ORDER — MIRTAZAPINE 7.5 MG PO TABS: 7.5000 mg | ORAL_TABLET | Freq: Every day | ORAL | 0 refills | Status: DC

## 2020-10-17 NOTE — Patient Instructions (Addendum)
-   Take Remeron 7.5 mg tablet one by mouth daily at bedtime for appetite. - Reduce Atorvastatin from 20 mg tablet to 10 mg tablet one by mouth daily.

## 2020-10-17 NOTE — Progress Notes (Signed)
Provider: Richarda Bladeinah Marylen Zuk FNP-C  Dariann Huckaba, Donalee Citrininah C, NP  Patient Care Team: Jazmarie Biever, Donalee Citrininah C, NP as PCP - General (Family Medicine)  Extended Emergency Contact Information Primary Emergency Contact: Prescott ParmaMcCoy , [name] Mobile Phone: [phone] Relation: Daughter Secondary Emergency Contact: Tawny AsalMacDonald,Tonya  [place] Home Phone: [phone] Relation: Daughter  Code Status:  Full Code  Goals of care: Advanced Directive information Advanced Directives 10/17/2020  Does Patient Have a Medical Advance Directive? Yes  Type of Advance Directive Living will  Does patient want to make changes to medical advance directive? No - Patient declined  Copy of Healthcare Power of Attorney in Chart? -  Would patient like information on creating a medical advance directive? No - Patient declined     Chief Complaint  Patient presents with  . Acute Visit    Possible UTI x 1week.    HPI:  Pt is a 79 y.o. female seen today for an acute visit for evaluation of possible urinary tract infection symptoms x 1 week.states has had increased urine frequency and feeling tired all the time worst when she walks.Her daughter present during visit states patient eats only small bites sometimes once a day.she sleeps all day and wakes up around 2 pm.states has no appetite.She denies any fever or chills.   She has had a 10 lbs weight loss over 3 months.  Daughter states was seen recently on 10/02/2020 by Neurolog[facility] [place] for memory loss/dementia.Namenda was started three tables daily for three weeks then titrated to 10 mg tablet twice daily.states tolerating well.she was advised to follow up in 3 months.     Past Medical History:  Diagnosis Date  . Abnormal chest x-ray   . Abnormal results of liver function studies   . Abnormal thyroid screen (blood)   . Acute sinusitis   . Alcohol screening   . Anemia, deficiency   . Arthropathy    . BMI 22.0-22.9, adult   . Carotid stenosis, bilateral   . Carotid stenosis, bilateral   . Cellulitis   . Chronic kidney disease, stage III (moderate) (HCC)   . Closed fracture of sacrum and coccyx, with routine healing, subsequent encounter   . Colon cancer screening   . Colon polyp   . Dementia (HCC)   . Dizzy spells   . Encounter for screening mammogram for breast cancer   . Facet arthropathy, lumbar   . Fatty pancreas   . Generalized abdominal or pelvic swelling or mass or lump   . Glaucoma screening   . H/O mammogram 2019   Per [facility] new patient packet  . Hepatomegalia   . High risk medication use   . Hospital discharge follow-up   . Hx of colonoscopy 2016   Per [facility] new patient packet  . Hypertension   . Hypertriglyceridemia   . Influenza vaccination declined by patient   . Low back pain   . Lumbar disc disease with radiculopathy   . Lung nodule   . Magnesium deficiency   . Medical non-compliance   . Menopause   . Microscopic hematuria   . Mixed dyslipidemia   . Neurodegenerative cognitive impairment (HCC)   . Osteoarthritis, hip, bilateral   . Overweight   . Personal history of smoking   . Plantar fasciitis   . Poor appetite   . Refusal of influenza vaccine by provider   . Renal cyst, left   . Right atrial enlargement   . Right hip  pain   . Sciatica of right side   . Screening for depression   . Screening for HIV (human immunodeficiency virus)   . Screening for STDs (sexually transmitted diseases)   . Senile dementia without behavioral disturbance (HCC)   . Shortness of breath   . Spondylolisthesis, grade 1   . Unintentional weight loss   . URI, acute   . Urinary frequency   . UTI (urinary tract infection), bacterial   . Vitamin D deficiency    History reviewed. No pertinent surgical history.  No Known Allergies  Outpatient Encounter Medications as of 10/17/2020  Medication Sig  . acetaminophen (TYLENOL) 500 MG tablet Take 2 tablets (1,000 mg total)  by mouth in the morning and at bedtime.  Marland Kitchen atorvastatin (LIPITOR) 20 MG tablet Take 20 mg by mouth at bedtime.   . cholecalciferol (VITAMIN D3) 25 MCG (1000 UNIT) tablet Take 1,000 Units by mouth daily.  Marland Kitchen donepezil (ARICEPT) 10 MG tablet Take 10 mg by mouth at bedtime. Before Bedtime.  . Ensure (ENSURE) Take 237 mLs by mouth daily.  . memantine (NAMENDA) 10 MG tablet Take 10 mg by mouth 2 (two) times daily.  . sertraline (ZOLOFT) 25 MG tablet Take 25 mg by mouth at bedtime.   No facility-administered encounter medications on file as of 10/17/2020.    Review of Systems  Constitutional: Positive for unexpected weight change. Negative for appetite change, chills, fatigue and fever.       Drinks Ensure and eats one meal.Reports no appetite   Respiratory: Negative for cough, chest tightness, shortness of breath and wheezing.   Cardiovascular: Negative for chest pain, palpitations and leg swelling.  Gastrointestinal: Negative for abdominal distention, abdominal pain, constipation, diarrhea, nausea and vomiting.  Genitourinary: Positive for frequency. Negative for difficulty urinating, dysuria, flank pain, hematuria and urgency.       Gets up 3-4 times   Musculoskeletal: Positive for arthralgias and back pain.  Skin: Negative for color change, pallor and rash.  Psychiatric/Behavioral: Negative for agitation, behavioral problems, confusion and sleep disturbance. The patient is not nervous/anxious.        Wakes up at 2 pm     Immunization History  Administered Date(s) Administered  . PFIZER SARS-COV-2 Vaccination 12/29/2019, 01/23/2020   Pertinent  Health Maintenance Due  Topic Date Due  . DEXA SCAN  Never done  . PNA vac Low Risk Adult (1 of 2 - PCV13) Never done  . INFLUENZA VACCINE  Never done   Fall Risk  10/17/2020 07/27/2020 07/03/2020  Falls in the past year? 0 1 0  Number falls in past yr: 0 0 0  Injury with Fall? 0 0 0   Functional Status Survey:    Vitals:   10/17/20 1452   BP: 100/80  Pulse: 96  Resp: 16  Temp: 98 F (36.7 C)  SpO2: 97%  Weight: 132 lb 3.2 oz (60 kg)  Height: 5\' 6"  (1.676 m)   Body mass index is 21.34 kg/m. Physical Exam Vitals reviewed.  Constitutional:      General: She is not in acute distress.    Appearance: She is not ill-appearing.     Comments: Frail   HENT:     Head: Normocephalic.  Eyes:     General: No scleral icterus.       Right eye: No discharge.        Left eye: No discharge.     Conjunctiva/sclera: Conjunctivae normal.     Pupils: Pupils are equal, round,  and reactive to light.  Cardiovascular:     Rate and Rhythm: Normal rate and regular rhythm.     Pulses: Normal pulses.     Heart sounds: Normal heart sounds. No murmur heard.  No friction rub. No gallop.   Pulmonary:     Effort: Pulmonary effort is normal. No respiratory distress.     Breath sounds: Normal breath sounds. No wheezing, rhonchi or rales.  Chest:     Chest wall: No tenderness.  Abdominal:     General: Bowel sounds are normal. There is no distension.     Palpations: Abdomen is soft. There is no mass.     Tenderness: There is no abdominal tenderness. There is no right CVA tenderness, guarding or rebound.  Musculoskeletal:     Cervical back: Normal range of motion. No rigidity or tenderness.  Lymphadenopathy:     Cervical: No cervical adenopathy.  Neurological:     Mental Status: She is alert. Mental status is at baseline.     Sensory: No sensory deficit.     Coordination: Coordination normal.     Gait: Gait abnormal.  Psychiatric:        Mood and Affect: Mood is depressed.        Speech: Speech normal.        Behavior: Behavior normal.        Thought Content: Thought content normal.        Cognition and Memory: Memory is impaired.     Labs reviewed: Recent Labs    07/19/20 0554 07/20/20 0546 07/27/20 1506  NA 139 142 144  K 2.9* 4.2 3.8  CL 102 106 110  CO2 27 26 24   GLUCOSE 91 86 90  BUN 20 15 9   CREATININE 0.96 0.91  1.00*  CALCIUM 9.0 9.2 9.2  MG  --  1.6*  --    Recent Labs    02/08/20 0000 02/08/20 0000 06/14/20 0000 06/14/20 0000 07/03/20 1343 07/17/20 1923 07/27/20 1506  AST 14   < > 22   < > 12 17 15   ALT 11   < > 13   < > 9 10 15   ALKPHOS  --   --   --   --   --  70  --   BILITOT  --   --   --   --  0.6 0.6 0.5  PROT  --   --   --   --  6.6 7.6 5.9*  ALBUMIN 4.4  --  4.1  --   --  4.2  --    < > = values in this interval not displayed.   Recent Labs    07/03/20 1343 07/03/20 1343 07/17/20 1923 07/17/20 1923 07/18/20 1136 07/19/20 0554 07/27/20 1506  WBC 9.3   < > 10.0   < > 11.0* 7.5 10.0  NEUTROABS 6,733  --  7.3  --   --   --  7,770  HGB 14.5   < > 14.7   < > 14.3 12.2 13.2  HCT 43.2   < > 43.8   < > 42.0 36.5 40.5  MCV 91.9   < > 89.6   < > 89.9 91.3 93.1  PLT 291   < > 358   < > 327 272 316   < > = values in this interval not displayed.   Lab Results  Component Value Date   TSH 0.56 07/03/2020   No results found for: HGBA1C Lab Results  Component Value Date   CHOL 159 07/03/2020   HDL 45 (L) 07/03/2020   LDLCALC 88 07/03/2020   TRIG 162 (H) 07/03/2020   CHOLHDL 3.5 07/03/2020    Significant Diagnostic Results in last 30 days:  No results found.  Assessment/Plan 1. Symptoms of urinary tract infection Afebrile. - POC Urinalysis Dipstick orange colored Cloudy urine positive for glucose,bilirubin,ketones,blood,protein,large leukocytes 3+ but negative for nitrites. - will send urine for culture and sensitivity to rule out UTI.  - Culture, Urine  2. Abnormal weight loss Has had 10 lbs weight loss over three months.Her weight loss could be due to multifactorial given her memory loss and hx of left apex lung nodule 3-4 mm (01/07/2018). - Advised to drink Ensure twice daily. - will start on Remeron as below for appetite stimulant. - mirtazapine (REMERON) 7.5 MG tablet; Take 1 tablet (7.5 mg total) by mouth at bedtime.  Dispense: 30 tablet; Refill: 0  3.  Hyperlipidemia LDL goal <100 Latest LDL 88, TRG 162  (07/03/2020).has had 10 lbs weight loss with generalized weakness.No muscle aches reported.Will decreased atorvastatin from 20 mg tablet to 10 mg tablet due to her poor appetite,weight loss and generalized weakness to prevent Rhabdomyolysis.  - atorvastatin (LIPITOR) 10 MG tablet; Take 1 tablet (10 mg total) by mouth at bedtime.  Dispense: 30 tablet; Refill: 3  4. Late onset Alzheimer's disease without behavioral disturbance [facility]) Seen recently by Neurologist Dr.Feraru at [facility] [place] for memory loss/dementia.Namenda was started three tables daily for three weeks then titrated to 10 mg tablet twice daily - continue on Aricept 10 mg tablet at bedtime.consider dose reduction if weight loss persist.  Family/ staff Communication: Reviewed plan of care with patient  Labs/tests ordered: - Culture, Urine  Next Appointment: Has upcoming appointment 11/02/2020 for 4 months for medical management of chronic issues.will re-evaluate weight.  Caesar Bookman, NP

## 2020-11-02 ENCOUNTER — Ambulatory Visit (INDEPENDENT_AMBULATORY_CARE_PROVIDER_SITE_OTHER): Payer: Medicare Other | Admitting: Family

## 2020-11-02 ENCOUNTER — Encounter: Payer: Self-pay | Admitting: Family

## 2020-11-02 ENCOUNTER — Other Ambulatory Visit: Payer: Self-pay

## 2020-11-02 VITALS — BP 130/80 | HR 64 | Temp 97.8°F | Resp 16 | Ht 66.0 in | Wt 133.0 lb

## 2020-11-02 DIAGNOSIS — G301 Alzheimer's disease with late onset: Secondary | ICD-10-CM | POA: Diagnosis not present

## 2020-11-02 DIAGNOSIS — Z780 Asymptomatic menopausal state: Secondary | ICD-10-CM

## 2020-11-02 DIAGNOSIS — M25552 Pain in left hip: Secondary | ICD-10-CM

## 2020-11-02 DIAGNOSIS — E785 Hyperlipidemia, unspecified: Secondary | ICD-10-CM | POA: Diagnosis not present

## 2020-11-02 DIAGNOSIS — G8929 Other chronic pain: Secondary | ICD-10-CM

## 2020-11-02 DIAGNOSIS — Z23 Encounter for immunization: Secondary | ICD-10-CM | POA: Diagnosis not present

## 2020-11-02 DIAGNOSIS — I10 Essential (primary) hypertension: Secondary | ICD-10-CM

## 2020-11-02 DIAGNOSIS — F0280 Dementia in other diseases classified elsewhere without behavioral disturbance: Secondary | ICD-10-CM

## 2020-11-02 MED ORDER — DTAP-IPV VACCINE IM SUSP: 0.5000 mL | Freq: Once | INTRAMUSCULAR | 0 refills | Status: AC

## 2020-11-02 MED ORDER — SALONPAS 3.1-6-10 % EX PTCH: 1.0000 | MEDICATED_PATCH | Freq: Every morning | CUTANEOUS | 1 refills | Status: DC

## 2020-11-02 NOTE — Progress Notes (Signed)
Provider: Marlowe Sax FNP-C   Khristi Schiller, Nelda Bucks, NP  Patient Care Team: Cordarious Zeek, Nelda Bucks, NP as PCP - General (Family Medicine)  Extended Emergency Contact Information Primary Emergency Contact: Lang Snow [place] Phone: [phone] Relation: Daughter Secondary Emergency Contact: Westley Hummer [place] Phone: [phone] Relation: Daughter  Code Status: Full Code  Goals of care: Advanced Directive information Advanced Directives 11/02/2020  Does Patient Have a Medical Advance Directive? Yes  Type of Advance Directive Living will  Does patient want to make changes to medical advance directive? No - Patient declined  Copy of [place] in Chart? -  Would patient like information on creating a medical advance directive? -     Chief Complaint  Patient presents with  . Medical Management of Chronic Issues    4 month follow up.  Marland Kitchen Health Maintenance    Discuss the need for Dexa Scan.  . Immunizations    Discuss the need for Tetanus Vaccine, PNA Vaccine, and Covid Booster.    HPI:  Pt is a 79 y.o. female seen today for 4 month follow up for medical management of chronic diseases.She is here with her daughter.she states Left hip pain worsening.pain is worst with walking.Has a walker at home but did not bring to visit today.Has not taken tylenol as advised in the past visit.Daughter states will get tylenol for her.No fall episode   Past Medical History:  Diagnosis Date  . Abnormal chest x-ray   . Abnormal results of liver function studies   . Abnormal thyroid screen (blood)   . Acute sinusitis   . Alcohol screening   . Anemia, deficiency   . Arthropathy   . BMI 22.0-22.9, adult   . Carotid stenosis, bilateral   . Carotid stenosis, bilateral   . Cellulitis   . Chronic kidney disease, stage III (moderate) (HCC)   . Closed fracture of sacrum and coccyx, with routine healing, subsequent encounter   . Colon cancer screening    . Colon polyp   . Dementia ([place])   . Dizzy spells   . Encounter for screening mammogram for breast cancer   . Facet arthropathy, lumbar   . Fatty pancreas   . Generalized abdominal or pelvic swelling or mass or lump   . Glaucoma screening   . H/O mammogram 2019   Per [place] new patient packet  . Hepatomegalia   . High risk medication use   . Hospital discharge follow-up   . Hx of colonoscopy 2016   Per [place] new patient packet  . Hypertension   . Hypertriglyceridemia   . Influenza vaccination declined by patient   . Low back pain   . Lumbar disc disease with radiculopathy   . Lung nodule   . Magnesium deficiency   . Medical non-compliance   . Menopause   . Microscopic hematuria   . Mixed dyslipidemia   . Neurodegenerative cognitive impairment ([place])   . Osteoarthritis, hip, bilateral   . Overweight   . Personal history of smoking   . Plantar fasciitis   . Poor appetite   . Refusal of influenza vaccine by provider   . Renal cyst, left   . Right atrial enlargement   . Right hip pain   . Sciatica of right side   . Screening for depression   . Screening for HIV (human immunodeficiency virus)   . Screening for STDs (sexually transmitted diseases)   . Senile dementia without behavioral disturbance ([place])   .  Shortness of breath   . Spondylolisthesis, grade 1   . Unintentional weight loss   . URI, acute   . Urinary frequency   . UTI (urinary tract infection), bacterial   . Vitamin D deficiency    History reviewed. No pertinent surgical history.  No Known Allergies  Allergies as of 11/02/2020   No Known Allergies     Medication List       Accurate as of November 02, 2020 11:59 PM. If you have any questions, ask your nurse or doctor.        acetaminophen 500 MG tablet Commonly known as: TYLENOL Take 2 tablets (1,000 mg total) by mouth in the morning and at bedtime.   atorvastatin 10 MG tablet Commonly known as: LIPITOR Take 1 tablet (10 mg total) by mouth at  bedtime.   cholecalciferol 25 MCG (1000 UNIT) tablet Commonly known as: VITAMIN D3 Take 1,000 Units by mouth daily.   donepezil 10 MG tablet Commonly known as: ARICEPT Take 10 mg by mouth at bedtime. Before Bedtime.   DTaP-IPV injection Commonly known as: KINRIX Inject 0.5 mLs into the muscle once for 1 dose.   Ensure Take 237 mLs by mouth daily.   memantine 10 MG tablet Commonly known as: NAMENDA Take 10 mg by mouth 2 (two) times daily.   mirtazapine 7.5 MG tablet Commonly known as: REMERON Take 1 tablet (7.5 mg total) by mouth at bedtime.   Salonpas 3.11-22-08 % [facility] Generic drug: Camphor-Menthol-Methyl Sal Apply 1 patch topically every morning. Started by: Sandrea Hughs, NP   sertraline 25 MG tablet Commonly known as: ZOLOFT Take 25 mg by mouth at bedtime.       Review of Systems  Constitutional: Negative for appetite change, chills, fatigue and fever.  HENT: Positive for rhinorrhea. Negative for congestion, hearing loss, sinus pressure, sinus pain, sneezing, sore throat and trouble swallowing.   Eyes: Positive for visual disturbance. Negative for discharge, redness and itching.       Sees floaters sometimes.wears eye glasses   Respiratory: Negative for cough, chest tightness, shortness of breath and wheezing.   Cardiovascular: Negative for chest pain, palpitations and leg swelling.  Gastrointestinal: Negative for abdominal distention, abdominal pain, blood in stool, constipation, diarrhea, nausea and vomiting.  Endocrine: Negative for cold intolerance, heat intolerance, polydipsia, polyphagia and polyuria.  Genitourinary: Negative for difficulty urinating, dysuria, flank pain, frequency and urgency.  Musculoskeletal: Positive for arthralgias. Negative for gait problem, myalgias, neck pain and neck stiffness.  Skin: Negative for color change, pallor and rash.  Neurological: Negative for dizziness, seizures, speech difficulty, weakness, light-headedness, numbness  and headaches.  Hematological: Does not bruise/bleed easily.  Psychiatric/Behavioral: Negative for agitation, behavioral problems and sleep disturbance. The patient is not nervous/anxious.     Immunization History  Administered Date(s) Administered  . PFIZER SARS-COV-2 Vaccination 12/29/2019, 01/23/2020   Pertinent  Health Maintenance Due  Topic Date Due  . DEXA SCAN  Never done  . PNA vac Low Risk Adult (1 of 2 - PCV13) Never done  . INFLUENZA VACCINE  02/14/2021 (Originally 06/17/2020)   Fall Risk  11/02/2020 10/17/2020 07/27/2020 07/03/2020  Falls in the past year? 0 0 1 0  Number falls in past yr: 0 0 0 0  Injury with Fall? 0 0 0 0   Functional Status Survey:    Vitals:   11/02/20 1106  BP: 130/80  Pulse: 64  Resp: 16  Temp: 97.8 F (36.6 C)  SpO2: 95%  Weight: 133 lb (  60.3 kg)  Height: 5' 6"  (1.676 m)   Body mass index is 21.47 kg/m. Physical Exam Vitals reviewed.  Constitutional:      General: She is not in acute distress.    Appearance: She is normal weight. She is not ill-appearing.  HENT:     Head: Normocephalic.     Right Ear: Tympanic membrane, ear canal and external ear normal. There is no impacted cerumen.     Left Ear: Tympanic membrane, ear canal and external ear normal. There is no impacted cerumen.     Nose: Nose normal. No congestion or rhinorrhea.     Mouth/Throat:     Mouth: Mucous membranes are moist.     Pharynx: Oropharynx is clear. No oropharyngeal exudate or posterior oropharyngeal erythema.  Eyes:     General: No scleral icterus.       Right eye: No discharge.        Left eye: No discharge.     Extraocular Movements: Extraocular movements intact.     Conjunctiva/sclera: Conjunctivae normal.     Pupils: Pupils are equal, round, and reactive to light.     Comments: Corrective lens in place   Neck:     Vascular: No carotid bruit.  Cardiovascular:     Rate and Rhythm: Normal rate and regular rhythm.     Pulses: Normal pulses.     Heart  sounds: Normal heart sounds. No murmur heard. No friction rub. No gallop.   Pulmonary:     Effort: Pulmonary effort is normal. No respiratory distress.     Breath sounds: Normal breath sounds. No wheezing, rhonchi or rales.  Chest:     Chest wall: No tenderness.  Abdominal:     General: Bowel sounds are normal. There is no distension.     Palpations: Abdomen is soft. There is no mass.     Tenderness: There is no abdominal tenderness. There is no right CVA tenderness, left CVA tenderness, guarding or rebound.  Musculoskeletal:        General: No swelling.     Cervical back: Normal range of motion. No rigidity or tenderness.     Right hip: Normal.     Left hip: Tenderness present. No bony tenderness or crepitus. Normal range of motion. Normal strength.     Right lower leg: No edema.     Left lower leg: No edema.       Legs:     Comments: Unsteady gait has a walker but came in without walker today.  Lymphadenopathy:     Cervical: No cervical adenopathy.  Skin:    General: Skin is warm and dry.     Coloration: Skin is not pale.     Findings: No bruising, erythema or rash.  Neurological:     Mental Status: She is alert. Mental status is at baseline.     Cranial Nerves: No cranial nerve deficit.     Sensory: No sensory deficit.     Motor: No weakness.     Coordination: Coordination normal.     Gait: Gait abnormal.  Psychiatric:        Mood and Affect: Mood normal.        Behavior: Behavior normal.        Thought Content: Thought content normal.        Judgment: Judgment normal.     Labs reviewed: Recent Labs    07/19/20 0554 07/20/20 0546 07/27/20 1506  NA 139 142 144  K 2.9* 4.2 3.8  CL 102 106 110  CO2 27 26 24   GLUCOSE 91 86 90  BUN 20 15 9   CREATININE 0.96 0.91 1.00*  CALCIUM 9.0 9.2 9.2  MG  --  1.6*  --    Recent Labs    02/08/20 0000 06/14/20 0000 07/03/20 1343 07/17/20 1923 07/27/20 1506  AST 14 22 12 17 15   ALT 11 13 9 10 15   ALKPHOS  --   --   --   70  --   BILITOT  --   --  0.6 0.6 0.5  PROT  --   --  6.6 7.6 5.9*  ALBUMIN 4.4 4.1  --  4.2  --    Recent Labs    07/03/20 1343 07/17/20 1923 07/18/20 1136 07/19/20 0554 07/27/20 1506  WBC 9.3 10.0 11.0* 7.5 10.0  NEUTROABS 6,733 7.3  --   --  7,770  HGB 14.5 14.7 14.3 12.2 13.2  HCT 43.2 43.8 42.0 36.5 40.5  MCV 91.9 89.6 89.9 91.3 93.1  PLT 291 358 327 272 316   Lab Results  Component Value Date   TSH 0.56 07/03/2020   No results found for: HGBA1C Lab Results  Component Value Date   CHOL 159 07/03/2020   HDL 45 (L) 07/03/2020   LDLCALC 88 07/03/2020   TRIG 162 (H) 07/03/2020   CHOLHDL 3.5 07/03/2020    Significant Diagnostic Results in last 30 days:  No results found.  Assessment/Plan  1. Essential hypertension B/p well controlled. Not on medication.  - CBC with Differential/Platelet; Future - CMP with eGFR([facility]); Future - TSH; Future  2. Hyperlipidemia LDL goal <100 Latest LDL reviewed at goal. - continue on atorvastatin 10 mg tablet daily. - Lipid panel; Future  3. Need for Tdap vaccination Advised to get Tdap vaccine at her pharmacy. - DTaP- injection; Inject 0.5 mLs into the muscle once for 1 dose.  Dispense: 0.5 mL; Refill: 0  4. Postmenopausal estrogen deficiency No fall or fracture. - continue on vitamin D supplement.  - DG Bone Density; Future  5. Late onset Alzheimer's disease without behavioral disturbance ([place]) No behavioral issues reported.still performs own ADL's except cooking assisted by daughter.Lives with her niece.   6. Chronic left hip pain Worsening pain.tender to palpated. - Advised to take Tylenol 1000 mg tablet twice daily and 500 mg tablet at 2 pm. - Salon Pas to left hip.  - Ambulatory referral to Orthopedic Surgery - Camphor-Menthol-Methyl Sal (SALONPAS) 3.11-22-08 % [facility]; Apply 1 patch topically every morning.  Dispense: 30 patch; Refill: 1  Family/ staff Communication: Reviewed plan of care with patient and daughter    Labs/tests ordered:  - CBC with Differential/Platelet; Future - CMP with eGFR([facility]); Future - TSH; Future -  Lipid panel; Future - DG Bone Density; Future  Next Appointment : 6 months for medical management of chronic issues.Fasting labs 2-4 days prior to visit.  Sandrea Hughs, NP

## 2020-11-07 ENCOUNTER — Telehealth: Payer: Self-pay | Admitting: Family Medicine

## 2020-11-08 ENCOUNTER — Ambulatory Visit (INDEPENDENT_AMBULATORY_CARE_PROVIDER_SITE_OTHER): Payer: Medicare Other | Admitting: Family Medicine

## 2020-11-08 ENCOUNTER — Other Ambulatory Visit: Payer: Self-pay

## 2020-11-08 VITALS — BP 182/83 | Ht 66.0 in | Wt 140.0 lb

## 2020-11-08 DIAGNOSIS — M545 Low back pain, unspecified: Secondary | ICD-10-CM | POA: Diagnosis not present

## 2020-11-08 NOTE — Assessment & Plan Note (Signed)
She has significant degenerative changes of the back as well as each hip joint.  Has some instability with extension as well as 1 leg testing.  May have component of osteoporosis which could be contributing to her symptoms. -Counseled on home exercise therapy and supportive care. -Consider back brace. -Counseled on obtaining bone density. -Could consider physical therapy.

## 2020-11-08 NOTE — Progress Notes (Signed)
Christina Perkins - 79 y.o. female MRN [id number]  Date of birth: 06/25/1941  SUBJECTIVE:  Including CC & ROS.  No chief complaint on file.   Christina Perkins is a 79 y.o. female that is presenting with acute on chronic low back pain.  The pain is localized to the low back.  She denies any injury or inciting event.  She has tried physical therapy in the past with limited improvement.  Notices the pain the most with walking..  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  Independent review of the lumbar spine x-ray from 8/31 shows anterolisthesis at L5-6.   Review of Systems See HPI   HISTORY: Past Medical, Surgical, Social, and Family History Reviewed & Updated per EMR.   Pertinent Historical Findings include:  Past Medical History:  Diagnosis Date  . Abnormal chest x-ray   . Abnormal results of liver function studies   . Abnormal thyroid screen (blood)   . Acute sinusitis   . Alcohol screening   . Anemia, deficiency   . Arthropathy   . BMI 22.0-22.9, adult   . Carotid stenosis, bilateral   . Carotid stenosis, bilateral   . Cellulitis   . Chronic kidney disease, stage III (moderate) (HCC)   . Closed fracture of sacrum and coccyx, with routine healing, subsequent encounter   . Colon cancer screening   . Colon polyp   . Dementia ([facility])   . Dizzy spells   . Encounter for screening mammogram for breast cancer   . Facet arthropathy, lumbar   . Fatty pancreas   . Generalized abdominal or pelvic swelling or mass or lump   . Glaucoma screening   . H/O mammogram 2019   Per [facility] new patient packet  . Hepatomegalia   . High risk medication use   . Hospital discharge follow-up   . Hx of colonoscopy 2016   Per [facility] new patient packet  . Hypertension   . Hypertriglyceridemia   . Influenza vaccination declined by patient   . Low back pain   . Lumbar disc disease with radiculopathy   . Lung nodule   . Magnesium deficiency   . Medical  non-compliance   . Menopause   . Microscopic hematuria   . Mixed dyslipidemia   . Neurodegenerative cognitive impairment ([facility])   . Osteoarthritis, hip, bilateral   . Overweight   . Personal history of smoking   . Plantar fasciitis   . Poor appetite   . Refusal of influenza vaccine by provider   . Renal cyst, left   . Right atrial enlargement   . Right hip pain   . Sciatica of right side   . Screening for depression   . Screening for HIV (human immunodeficiency virus)   . Screening for STDs (sexually transmitted diseases)   . Senile dementia without behavioral disturbance ([facility])   . Shortness of breath   . Spondylolisthesis, grade 1   . Unintentional weight loss   . URI, acute   . Urinary frequency   . UTI (urinary tract infection), bacterial   . Vitamin D deficiency     No past surgical history on file.  Family History  Problem Relation Age of Onset  . Dementia Mother   . Alzheimer's disease Mother   . Hypertension Mother   . Dementia Father   . Blindness Father   . Hypertension Father   . Stroke Father     Social History  Socioeconomic History  . Marital status: Divorced    Spouse name: Not on file  . Number of children: Not on file  . Years of education: Not on file  . Highest education level: Not on file  Occupational History  . Not on file  Tobacco Use  . Smoking status: Former Games developer  . Smokeless tobacco: Never Used  Substance and Sexual Activity  . Alcohol use: Not Currently    Comment: 6 glasses of wine a week.  . Drug use: No  . Sexual activity: Not on file  Other Topics Concern  . Not on file  Social History Narrative   Diet Unanswered      Do you drink/eat things with caffeine Unanswered      Marital Status: Divorced What year were you married? Unanswered      Do you live in a house, apartment, assisted living, condo, trailer, etc.? House      Is it one or more stories? One      How many persons live in your home? 1         Do you  have any pets in your home?(please list) No      Highest level of education completed: Some college      Current or past profession: Unanswered      Do you exercise?:No    Type and how often:      Do you have a Living Will? Yes, in process      Do you have a DNR form?          If not, would you like to discuss one? unanswered on new patient packet      Do you have signed POA/HPOA forms? Yes      Do you have difficulty bathing or dressing yourself? No      Do you have difficulty preparing food or eating? No      Do you have difficulty managing medications? Yes      Do you have difficulty managing your finances? Yes      Do you have difficulty affording your medications? No                  Social Determinants of Corporate investment banker Strain: Not on file  Food Insecurity: Not on file  Transportation Needs: Not on file  Physical Activity: Not on file  Stress: Not on file  Social Connections: Not on file  Intimate Partner Violence: Not on file     PHYSICAL EXAM:  VS: BP (!) 182/83   Ht 5\' 6"  (1.676 m)   Wt 140 lb (63.5 kg)   BMI 22.60 kg/m  Physical Exam Gen: NAD, alert, cooperative with exam, well-appearing MSK:  Back: Normal flexion. Limited extension. Weakness with hip flexion and abduction. Negative straight leg raise. Neurovascularly intact     ASSESSMENT & PLAN:   Acute bilateral low back pain without sciatica She has significant degenerative changes of the back as well as each hip joint.  Has some instability with extension as well as 1 leg testing.  May have component of osteoporosis which could be contributing to her symptoms. -Counseled on home exercise therapy and supportive care. -Consider back brace. -Counseled on obtaining bone density. -Could consider physical therapy.

## 2020-11-08 NOTE — Patient Instructions (Signed)
Nice to meet you Please try heat  Please try the voltaren  Please try the exercises  Please try to get the bone density completed  We'll call about the back brace   Please send me a message in MyChart with any questions or updates.  Please see me back in 4 weeks.   --Dr. Jordan Likes

## 2020-11-20 DIAGNOSIS — M545 Low back pain, unspecified: Secondary | ICD-10-CM | POA: Diagnosis not present

## 2021-01-02 DIAGNOSIS — R413 Other amnesia: Secondary | ICD-10-CM | POA: Diagnosis not present

## 2021-01-15 ENCOUNTER — Other Ambulatory Visit: Payer: Self-pay

## 2021-01-15 ENCOUNTER — Emergency Department (HOSPITAL_BASED_OUTPATIENT_CLINIC_OR_DEPARTMENT_OTHER): Payer: Medicare Other

## 2021-01-15 ENCOUNTER — Emergency Department (HOSPITAL_BASED_OUTPATIENT_CLINIC_OR_DEPARTMENT_OTHER)
Admission: EM | Admit: 2021-01-15 | Discharge: 2021-01-15 | Disposition: A | Discharge status: Home / Self Care | Payer: Medicare Other | Attending: Emergency Medicine | Admitting: Emergency Medicine

## 2021-01-15 ENCOUNTER — Encounter (HOSPITAL_BASED_OUTPATIENT_CLINIC_OR_DEPARTMENT_OTHER): Payer: Self-pay | Admitting: Emergency Medicine

## 2021-01-15 DIAGNOSIS — I129 Hypertensive chronic kidney disease with stage 1 through stage 4 chronic kidney disease, or unspecified chronic kidney disease: Secondary | ICD-10-CM | POA: Diagnosis not present

## 2021-01-15 DIAGNOSIS — N183 Chronic kidney disease, stage 3 unspecified: Secondary | ICD-10-CM | POA: Insufficient documentation

## 2021-01-15 DIAGNOSIS — F0390 Unspecified dementia without behavioral disturbance: Secondary | ICD-10-CM | POA: Insufficient documentation

## 2021-01-15 DIAGNOSIS — N390 Urinary tract infection, site not specified: Secondary | ICD-10-CM | POA: Diagnosis not present

## 2021-01-15 DIAGNOSIS — R630 Anorexia: Secondary | ICD-10-CM | POA: Diagnosis not present

## 2021-01-15 DIAGNOSIS — Z87891 Personal history of nicotine dependence: Secondary | ICD-10-CM | POA: Insufficient documentation

## 2021-01-15 DIAGNOSIS — Z20822 Contact with and (suspected) exposure to covid-19: Secondary | ICD-10-CM | POA: Diagnosis not present

## 2021-01-15 DIAGNOSIS — R0989 Other specified symptoms and signs involving the circulatory and respiratory systems: Secondary | ICD-10-CM | POA: Diagnosis not present

## 2021-01-15 DIAGNOSIS — J8410 Pulmonary fibrosis, unspecified: Secondary | ICD-10-CM | POA: Diagnosis not present

## 2021-01-15 DIAGNOSIS — Z9889 Other specified postprocedural states: Secondary | ICD-10-CM | POA: Diagnosis not present

## 2021-01-15 DIAGNOSIS — R3915 Urgency of urination: Secondary | ICD-10-CM | POA: Diagnosis present

## 2021-01-15 DIAGNOSIS — R531 Weakness: Secondary | ICD-10-CM | POA: Diagnosis not present

## 2021-01-15 DIAGNOSIS — R9431 Abnormal electrocardiogram [ECG] [EKG]: Secondary | ICD-10-CM | POA: Diagnosis not present

## 2021-01-15 LAB — URINALYSIS, ROUTINE W REFLEX MICROSCOPIC
Bilirubin Urine: NEGATIVE
Glucose, UA: NEGATIVE mg/dL
Ketones, ur: NEGATIVE mg/dL
Nitrite: NEGATIVE
Protein, ur: 30 mg/dL — AB
Specific Gravity, Urine: 1.03 (ref 1.005–1.030)
pH: 5 (ref 5.0–8.0)

## 2021-01-15 LAB — CBC WITH DIFFERENTIAL/PLATELET
Abs Immature Granulocytes: 0.02 10*3/uL (ref 0.00–0.07)
Basophils Absolute: 0 10*3/uL (ref 0.0–0.1)
Basophils Relative: 0 %
Eosinophils Absolute: 0.1 10*3/uL (ref 0.0–0.5)
Eosinophils Relative: 1 %
HCT: 39.9 % (ref 36.0–46.0)
Hemoglobin: 13.6 g/dL (ref 12.0–15.0)
Immature Granulocytes: 0 %
Lymphocytes Relative: 19 %
Lymphs Abs: 1.5 10*3/uL (ref 0.7–4.0)
MCH: 30.5 pg (ref 26.0–34.0)
MCHC: 34.1 g/dL (ref 30.0–36.0)
MCV: 89.5 fL (ref 80.0–100.0)
Monocytes Absolute: 0.5 10*3/uL (ref 0.1–1.0)
Monocytes Relative: 6 %
Neutro Abs: 5.5 10*3/uL (ref 1.7–7.7)
Neutrophils Relative %: 74 %
Platelets: 222 10*3/uL (ref 150–400)
RBC: 4.46 MIL/uL (ref 3.87–5.11)
RDW: 13 % (ref 11.5–15.5)
WBC: 7.6 10*3/uL (ref 4.0–10.5)
nRBC: 0 % (ref 0.0–0.2)

## 2021-01-15 LAB — BASIC METABOLIC PANEL
Anion gap: 8 (ref 5–15)
BUN: 17 mg/dL (ref 8–23)
CO2: 25 mmol/L (ref 22–32)
Calcium: 9.3 mg/dL (ref 8.9–10.3)
Chloride: 108 mmol/L (ref 98–111)
Creatinine, Ser: 1.15 mg/dL — ABNORMAL HIGH (ref 0.44–1.00)
GFR, Estimated: 48 mL/min — ABNORMAL LOW (ref >60)
Glucose, Bld: 97 mg/dL (ref 70–99)
Potassium: 3.8 mmol/L (ref 3.5–5.1)
Sodium: 141 mmol/L (ref 135–145)

## 2021-01-15 LAB — RESP PANEL BY RT-PCR (FLU A&B, COVID) ARPGX2
Influenza A by PCR: NEGATIVE
Influenza B by PCR: NEGATIVE
SARS Coronavirus 2 by RT PCR: NEGATIVE

## 2021-01-15 LAB — URINALYSIS, MICROSCOPIC (REFLEX): WBC, UA: >50 WBC/hpf (ref 0–5)

## 2021-01-15 LAB — TROPONIN I (HIGH SENSITIVITY): Troponin I (High Sensitivity): 6 ng/L (ref <18)

## 2021-01-15 LAB — LACTIC ACID, PLASMA: Lactic Acid, Venous: 1.2 mmol/L (ref 0.5–1.9)

## 2021-01-15 MED ORDER — SODIUM CHLORIDE 0.9 % IV BOLUS
1000.0000 mL | Freq: Once | INTRAVENOUS | Status: AC
  Administered 2021-01-15: 1000 mL via INTRAVENOUS

## 2021-01-15 MED ORDER — SODIUM CHLORIDE 0.9 % IV SOLN
1.0000 g | Freq: Once | INTRAVENOUS | Status: AC
  Administered 2021-01-15: 1 g via INTRAVENOUS
  Filled 2021-01-15: qty 10

## 2021-01-15 MED ORDER — CEPHALEXIN 500 MG PO CAPS: 500.0000 mg | ORAL_CAPSULE | Freq: Two times a day (BID) | ORAL | 0 refills | Status: AC

## 2021-01-15 NOTE — ED Notes (Signed)
Called lab to add on troponin test to blood already in lab.

## 2021-01-15 NOTE — ED Triage Notes (Signed)
Reports weakness and poor appetite for about a week.  Also reports some urinary frequency with hx of UTI and dehydration.

## 2021-01-15 NOTE — ED Notes (Signed)
EKG acknowledged. PT in Scan Will get immediately upon return.

## 2021-01-15 NOTE — ED Provider Notes (Addendum)
[facility] EMERGENCY DEPARTMENT Provider Note   CSN: [id number] Arrival date & time: 01/15/21  1400     History Chief Complaint  Patient presents with  . Weakness  . Urinary Frequency    Christina Perkins is a 80 y.o. female who presents with her son [name] at the bedside for concern of 2 weeks of progressively worsening weakness, anorexia, and symptoms urinary frequency and urgency.  Additionally her son endorses that her urine has a foul smell.  Patient lives alone, but her adult children check on her in person multiple times a day to fix her meals.  At the time of my initial evaluation patient is lying calmly in her hospital bed with her eyes closed, she will answer questions when asked directly, but allows her son to family history unless she is explicitly asked for her.  She denies being in any pain, denies shortness of breath or palpitations, denies headaches.  According the patient's son she has some underlying dementia, but has not been acting more confused lately.  Patient has history of urinary tract infection with MDR E. coli, with admission due to UTI with AKI in September 2021.  Most recent culture 12/2017  I personally read this patient's medical records.  She additionally has history of avascular necrosis of bilateral femoral heads, hypertension, hypertriglyceridemia, and dementia.  HPI     Past Medical History:  Diagnosis Date  . Abnormal chest x-ray   . Abnormal results of liver function studies   . Abnormal thyroid screen (blood)   . Acute sinusitis   . Alcohol screening   . Anemia, deficiency   . Arthropathy   . BMI 22.0-22.9, adult   . Carotid stenosis, bilateral   . Carotid stenosis, bilateral   . Cellulitis   . Chronic kidney disease, stage III (moderate) (HCC)   . Closed fracture of sacrum and coccyx, with routine healing, subsequent encounter   . Colon cancer screening   . Colon polyp   . Dementia (HCC)   . Dizzy spells   . Encounter for  screening mammogram for breast cancer   . Facet arthropathy, lumbar   . Fatty pancreas   . Generalized abdominal or pelvic swelling or mass or lump   . Glaucoma screening   . H/O mammogram 2019   Per [facility] new patient packet  . Hepatomegalia   . High risk medication use   . Hospital discharge follow-up   . Hx of colonoscopy 2016   Per [facility] new patient packet  . Hypertension   . Hypertriglyceridemia   . Influenza vaccination declined by patient   . Low back pain   . Lumbar disc disease with radiculopathy   . Lung nodule   . Magnesium deficiency   . Medical non-compliance   . Menopause   . Microscopic hematuria   . Mixed dyslipidemia   . Neurodegenerative cognitive impairment (HCC)   . Osteoarthritis, hip, bilateral   . Overweight   . Personal history of smoking   . Plantar fasciitis   . Poor appetite   . Refusal of influenza vaccine by provider   . Renal cyst, left   . Right atrial enlargement   . Right hip pain   . Sciatica of right side   . Screening for depression   . Screening for HIV (human immunodeficiency virus)   . Screening for STDs (sexually transmitted diseases)   . Senile dementia without behavioral disturbance (HCC)   . Shortness of breath   . Spondylolisthesis, grade  1   . Unintentional weight loss   . URI, acute   . Urinary frequency   . UTI (urinary tract infection), bacterial   . Vitamin D deficiency     Patient Active Problem List   Diagnosis Date Noted  . Acute bilateral low back pain without sciatica 11/08/2020  . Malnutrition of moderate degree 07/19/2020  . Weakness 07/18/2020  . Generalized weakness 07/18/2020  . Acute lower UTI 07/18/2020  . Fall at home, initial encounter 07/18/2020  . Hypokalemia 07/18/2020  . Avascular necrosis of femoral head (HCC) 07/18/2020  . Essential hypertension 07/18/2020  . Injury of toe on right foot 11/07/2013  . Edema of toe 11/07/2013  . Pain in toe of right foot 11/07/2013    History reviewed. No  pertinent surgical history.   OB History   No obstetric history on file.     Family History  Problem Relation Age of Onset  . Dementia Mother   . Alzheimer's disease Mother   . Hypertension Mother   . Dementia Father   . Blindness Father   . Hypertension Father   . Stroke Father     Social History   Tobacco Use  . Smoking status: Former Games developermoker  . Smokeless tobacco: Never Used  Substance Use Topics  . Alcohol use: Not Currently    Comment: 6 glasses of wine a week.  . Drug use: No    Home Medications Prior to Admission medications   Medication Sig Start Date End Date Taking? Authorizing Provider  cephALEXin (KEFLEX) 500 MG capsule Take 1 capsule (500 mg total) by mouth 2 (two) times daily for 7 days. 01/15/21 01/22/21 Yes Haydan Wedig, Eugene Gaviaebekah R, PA-C  acetaminophen (TYLENOL) 500 MG tablet Take 2 tablets (1,000 mg total) by mouth in the morning and at bedtime. 07/27/20   Ngetich, Dinah C, NP  atorvastatin (LIPITOR) 10 MG tablet Take 1 tablet (10 mg total) by mouth at bedtime. 10/17/20   Ngetich, Dinah C, NP  Camphor-Menthol-Methyl Sal (SALONPAS) 3.11-22-08 % [facility] Apply 1 patch topically every morning. 11/02/20   Ngetich, Dinah C, NP  cholecalciferol (VITAMIN D3) 25 MCG (1000 UNIT) tablet Take 1,000 Units by mouth daily.    [provider]  donepezil (ARICEPT) 10 MG tablet Take 10 mg by mouth at bedtime. Before Bedtime.    [provider]  Ensure (ENSURE) Take 237 mLs by mouth daily.    [provider]  memantine (NAMENDA) 10 MG tablet Take 10 mg by mouth 2 (two) times daily.    [provider]  mirtazapine (REMERON) 7.5 MG tablet Take 1 tablet (7.5 mg total) by mouth at bedtime. 10/17/20   Ngetich, Dinah C, NP  sertraline (ZOLOFT) 25 MG tablet Take 25 mg by mouth at bedtime.    [provider]    Allergies    Patient has no known allergies.  Review of Systems   Review of Systems  Constitutional: Positive for activity change, appetite  change and fatigue. Negative for chills, diaphoresis and fever.  HENT: Negative.   Eyes: Negative.   Respiratory: Negative.   Cardiovascular: Negative.   Gastrointestinal: Negative.   Genitourinary: Positive for frequency and urgency. Negative for decreased urine volume, difficulty urinating, dysuria, enuresis, flank pain, genital sores, hematuria, pelvic pain, vaginal bleeding and vaginal discharge.  Musculoskeletal: Negative.   Skin: Negative.   Neurological: Positive for weakness. Negative for dizziness, tremors, numbness and headaches.    Physical Exam Updated Vital Signs BP (!) 176/84   Pulse Marland Kitchen(!)  59   Temp 97.8 F (36.6 C) (Oral)   Resp 18   Ht 5\' 6"  (1.676 m)   Wt 65.8 kg   SpO2 98%   BMI 23.40 kg/m   Physical Exam Vitals and nursing note reviewed.  Constitutional:      General: She is not in acute distress.    Appearance: She is normal weight. She is not toxic-appearing.  HENT:     Head: Normocephalic and atraumatic.     Nose: Nose normal.     Mouth/Throat:     Mouth: Mucous membranes are moist.     Pharynx: Oropharynx is clear. Uvula midline. No oropharyngeal exudate, posterior oropharyngeal erythema or uvula swelling.     Tonsils: No tonsillar exudate.  Eyes:     General: Lids are normal. Vision grossly intact.        Right eye: No discharge.        Left eye: No discharge.     Extraocular Movements: Extraocular movements intact.     Conjunctiva/sclera: Conjunctivae normal.     Pupils: Pupils are equal, round, and reactive to light.  Neck:     Trachea: Trachea and phonation normal.  Cardiovascular:     Rate and Rhythm: Normal rate and regular rhythm.     Pulses:          Radial pulses are 2+ on the right side and 2+ on the left side.       Dorsalis pedis pulses are 1+ on the right side and 1+ on the left side.     Heart sounds: Normal heart sounds. No murmur heard.   Pulmonary:     Effort: Pulmonary effort is normal. No tachypnea, bradypnea, accessory  muscle usage, prolonged expiration or respiratory distress.     Breath sounds: Examination of the right-lower field reveals decreased breath sounds. Decreased breath sounds present. No wheezing or rales.  Chest:     Chest wall: No deformity, swelling, tenderness or crepitus.  Abdominal:     General: Bowel sounds are normal. There is no distension.     Palpations: Abdomen is soft.     Tenderness: There is no abdominal tenderness. There is no right CVA tenderness, left CVA tenderness, guarding or rebound.  Musculoskeletal:        General: No deformity.     Cervical back: Neck supple. No tenderness or crepitus. No pain with movement, spinous process tenderness or muscular tenderness.     Right lower leg: No edema.     Left lower leg: No edema.  Lymphadenopathy:     Cervical: No cervical adenopathy.  Skin:    General: Skin is warm and dry.     Capillary Refill: Capillary refill takes less than 2 seconds.  Neurological:     General: No focal deficit present.     Mental Status: She is oriented to person, place, and time and easily aroused. Mental status is at baseline. She is lethargic.     Cranial Nerves: Cranial nerves are intact.     Sensory: Sensation is intact.     Motor: Motor function is intact.  Psychiatric:        Mood and Affect: Mood normal.     ED Results / Procedures / Treatments   Labs (all labs ordered are listed, but only abnormal results are displayed) Labs Reviewed  BASIC METABOLIC PANEL - Abnormal; Notable for the following components:      Result Value   Creatinine, Ser 1.15 (*)    GFR,  Estimated 48 (*)    All other components within normal limits  URINALYSIS, ROUTINE W REFLEX MICROSCOPIC - Abnormal; Notable for the following components:   APPearance CLOUDY (*)    Hgb urine dipstick SMALL (*)    Protein, ur 30 (*)    Leukocytes,Ua LARGE (*)    All other components within normal limits  URINALYSIS, MICROSCOPIC (REFLEX) - Abnormal; Notable for the following  components:   Bacteria, UA FEW (*)    All other components within normal limits  RESP PANEL BY RT-PCR (FLU A&B, COVID) ARPGX2  URINE CULTURE  CBC WITH DIFFERENTIAL/PLATELET  LACTIC ACID, PLASMA  TROPONIN I (HIGH SENSITIVITY)    EKG EKG Interpretation  Date/Time:  Tuesday January 15 2021 15:11:17 EST Ventricular Rate:  64 PR Interval:    QRS Duration: 89 QT Interval:  396 QTC Calculation: 409 R Axis:   69 Text Interpretation: Sinus rhythm Consider left atrial enlargement Nonspecific T abnormalities, lateral leads Baseline wander in lead(s) V2 V6 No STEMI Confirmed by Alona Bene [phone]) on 01/15/2021 3:20:56 PM   Radiology DG Chest Portable 1 View  Result Date: 01/15/2021 CLINICAL DATA:  Diminished breath sounds right lung base EXAM: PORTABLE CHEST 1 VIEW COMPARISON:  01/07/2018 FINDINGS: Single frontal view of the chest demonstrates an unremarkable cardiac silhouette. No airspace disease, effusion, or pneumothorax. Stable postsurgical changes at the left apex. There are calcified granuloma seen at the left apex and bilateral lung bases. No acute bony abnormalities. IMPRESSION: 1. No acute intrathoracic process. Electronically Signed   By: Sharlet Salina M.D.   On: 01/15/2021 15:27    Procedures Procedures   Medications Ordered in ED Medications  sodium chloride 0.9 % bolus 1,000 mL (0 mLs Intravenous Stopped 01/15/21 1715)  cefTRIAXone (ROCEPHIN) 1 g in sodium chloride 0.9 % 100 mL IVPB (0 g Intravenous Stopped 01/15/21 1802)    ED Course  I have reviewed the triage vital signs and the nursing notes.  Pertinent labs & imaging results that were available during my care of the patient were reviewed by me and considered in my medical decision making (see chart for details).  Clinical Course as of 01/15/21 1849  Tue Jan 15, 2021  [id number] 80 year old female with a history of early Alzheimer's dementia presenting in the company of her son with complaint of 2 to 3 weeks of generalized  weakness.  The patient reports that she considers very low energy.  Her son says it got worse in the past 2 days, the patient is when to eat and she is sitting in bed.  He says she has had very similar symptoms in the past and has been treated for UTIs for this.  The patient herself denies that she has a headache, cough, congestion, chest pain, dysuria.  On exam she appears very tired.  Her vital signs are within normal limits.  Her labs show a normal lactate, no leukocytosis, no evidence of any significant AKI or dehydration.  Creatinine is mildly elevated.  We can give her some IV fluids.  I doubt this is sepsis.  We will need to check a UA.  We will also check a Covid test.  I have added on a troponin level as well to look for atypical ACS.  Her EKG is not acutely ischemic.  Her chest x-ray shows no sign of infection. [MT]  1550 We will plan to follow-up on her Covid test and her UA.  If this work-up is unremarkable she could be discharged home in the  company of her son, with encouragement to drink fluids and follow-up with her doctor. [MT]    Clinical Course User Index [MT] Trifan, Kermit Balo, MD   MDM Rules/Calculators/A&P                          57-year-old female who presents with concern for 2 weeks of progressively worsening generalized fatigue, loss of appetite, and urinary symptoms.  History of MDR E. coli UTIs, with admission for AKI + UTI in 07/2020.  Differential diagnosis for this patient's symptoms include but are not limited to UTI,  Pyelonephritis, dehydration, sepsis, electrolyte derangement.   Borderline blood pressure of 97/60 on intake.  Vital signs otherwise normal.  Cardiopulmonary exam revealed diminished breath sounds in the right lung base, but no rales or wheezing.  Abdominal exam is benign.  Patient is neurovascularly intact in all 4 extremities.  She follows commands and is oriented x4, but expresses feeling "just beat" and prefers to lie still with her eyes closed when not  participating in exam.   CBC is unremarkable, BMP with mild AKI with creatinine 1.5.  UA suggestive of possible infection.  Lactic acid is normal.   Initial troponin negative, 6.  EKG reassuring. Will provide IV fluid resuscitation and dose of Rocephin in the ED. Hx of MDR E.coli, most recent culture 12/2017, sensitive to rocephin.   Patient reevaluated after ministration of IV fluids and initial dose of antibiotics.  She is much more interactive, opening her eyes, talking, and laughing.  She states she is feeling better.  At this time given reassuring physical exam, vital signs, laboratory studies, do not feel any further work-up is warranted in the ED. will discharge with course of antibiotics at home.  Patient to follow-up with her primary care doctor on Friday as scheduled.  Demyah voiced understanding of her medical evaluation and treatment plan.  Each of her questions was answered to her expressed satisfaction.  Return cautions were given.  Patient is stable and appropriate for discharge at this time.  This chart was dictated using voice recognition software, Dragon. Despite the best efforts of this provider to proofread and correct errors, errors may still occur which can change documentation meaning.  Final Clinical Impression(s) / ED Diagnoses Final diagnoses:  Lower urinary tract infectious disease    Rx / DC Orders ED Discharge Orders         Ordered    cephALEXin (KEFLEX) 500 MG capsule  2 times daily        01/15/21 1839           Olando Willems, Eugene Gavia, PA-C 01/15/21 1844    Cevin Rubinstein, Eugene Gavia, PA-C 01/15/21 1849    Terald Sleeper, MD 01/15/21 1907

## 2021-01-15 NOTE — Discharge Instructions (Addendum)
Christina Perkins was seen in the ER for her weakness and her urinary symptoms.  Her physical exam and vital signs were very reassuring.  Blood work was also reassuring as was her chest x-ray and EKG.  There does appear to be an early urinary tract infection based on her urine test.  She was administered first dose of antibiotics in the emergency department and has been discharged home on antibiotics as well.  She is to take these twice a day for the next 7 days.  Please follow-up with her primary care doctor on Friday as scheduled  I would encourage her to increase her food and water intake, prioritizing staying hydrated.  Return to the emergency department if she develops any worsening weakness, she becomes lethargic, develops difficulty breathing, or any other new severe symptoms.

## 2021-01-15 NOTE — ED Notes (Signed)
Pt reports fatigue and weakness x 3 weeks that has worsened over past 2 days.

## 2021-01-17 LAB — URINE CULTURE

## 2021-01-18 ENCOUNTER — Encounter: Payer: Self-pay | Admitting: Family

## 2021-01-18 ENCOUNTER — Ambulatory Visit (INDEPENDENT_AMBULATORY_CARE_PROVIDER_SITE_OTHER): Payer: Medicare Other | Admitting: Family

## 2021-01-18 ENCOUNTER — Other Ambulatory Visit: Payer: Self-pay

## 2021-01-18 VITALS — BP 120/80 | HR 71 | Temp 97.3°F | Resp 16 | Ht 66.0 in | Wt 145.0 lb

## 2021-01-18 DIAGNOSIS — R531 Weakness: Secondary | ICD-10-CM

## 2021-01-18 DIAGNOSIS — R399 Unspecified symptoms and signs involving the genitourinary system: Secondary | ICD-10-CM | POA: Diagnosis not present

## 2021-01-18 LAB — POCT URINALYSIS DIPSTICK
Glucose, UA: NEGATIVE
Nitrite, UA: NEGATIVE
Protein, UA: POSITIVE — AB
Spec Grav, UA: >=1.03 — AB (ref 1.010–1.025)
Urobilinogen, UA: NEGATIVE E.U./dL — AB
pH, UA: 5 (ref 5.0–8.0)

## 2021-01-18 NOTE — Progress Notes (Signed)
Provider: Marlowe Sax FNP-C  Breckin Zafar, Nelda Bucks, NP  Patient Care Team: Naeem Quillin, Nelda Bucks, NP as PCP - General (Family Medicine)  Extended Emergency Contact Information Primary Emergency Contact: Lang Snow [place] Phone: [phone] Relation: Daughter Secondary Emergency Contact: Westley Hummer [place] Phone: [phone] Relation: Daughter  Code Status:  Full Code  Goals of care: Advanced Directive information Advanced Directives 01/15/2021  Does Patient Have a Medical Advance Directive? No  Type of Advance Directive -  Does patient want to make changes to medical advance directive? -  Copy of [place] in Chart? -  Would patient like information on creating a medical advance directive? No - Patient declined     Chief Complaint  Patient presents with  . Acute Visit    Patient complains of not eating, headache, weakness and some shortness of breath when walking. Son took patient to ER on 26 and was told she had UTI. She has been sick for a couple of weeks. Son wants to know why she keeps getting UTI.    HPI:  Pt is a 80 y.o. female seen today for an acute visit for follow up ER visit for generalized weakness,not eating and headache.she is here with son.He states took patient to [facility] ED on 01/15/2021 for not eating and generalized weakness.she was treated for dehydration and UTI.Urine analysis showed yellow cloudy urine with large leukocytes but negative for nitrites.I.V rocephin was given and discharged on Keflex. Urine culture showed multiple species suggesting possible contamination.Labs were unremarkable.SARs COVID-19 test was negative. She denies any fever,chills or cough.States still has some generalized weakness and short of breath when she walks.chest X-ray done at the hospital was negative for abnormality.  Past Medical History:  Diagnosis Date  . Abnormal chest x-ray   . Abnormal results of liver function  studies   . Abnormal thyroid screen (blood)   . Acute sinusitis   . Alcohol screening   . Anemia, deficiency   . Arthropathy   . BMI 22.0-22.9, adult   . Carotid stenosis, bilateral   . Carotid stenosis, bilateral   . Cellulitis   . Chronic kidney disease, stage III (moderate) (HCC)   . Closed fracture of sacrum and coccyx, with routine healing, subsequent encounter   . Colon cancer screening   . Colon polyp   . Dementia ([place])   . Dizzy spells   . Encounter for screening mammogram for breast cancer   . Facet arthropathy, lumbar   . Fatty pancreas   . Generalized abdominal or pelvic swelling or mass or lump   . Glaucoma screening   . H/O mammogram 2019   Per [facility] new patient packet  . Hepatomegalia   . High risk medication use   . Hospital discharge follow-up   . Hx of colonoscopy 2016   Per [facility] new patient packet  . Hypertension   . Hypertriglyceridemia   . Influenza vaccination declined by patient   . Low back pain   . Lumbar disc disease with radiculopathy   . Lung nodule   . Magnesium deficiency   . Medical non-compliance   . Menopause   . Microscopic hematuria   . Mixed dyslipidemia   . Neurodegenerative cognitive impairment ([place])   . Osteoarthritis, hip, bilateral   . Overweight   . Personal history of smoking   . Plantar fasciitis   . Poor appetite   . Refusal of influenza vaccine by provider   . Renal cyst,  left   . Right atrial enlargement   . Right hip pain   . Sciatica of right side   . Screening for depression   . Screening for HIV (human immunodeficiency virus)   . Screening for STDs (sexually transmitted diseases)   . 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 Shortness of breath   . Spondylolisthesis, grade 1   . Unintentional weight loss   . URI, acute   . Urinary frequency   . UTI (urinary tract infection), bacterial   . Vitamin D deficiency    No past surgical history on file.  No Known Allergies  Outpatient Encounter  Medications as of 01/18/2021  Medication Sig  . acetaminophen (TYLENOL) 500 MG tablet Take 2 tablets (1,000 mg total) by mouth in the morning and at bedtime.  Marland Kitchen atorvastatin (LIPITOR) 10 MG tablet Take 1 tablet (10 mg total) by mouth at bedtime.  . Camphor-Menthol-Methyl Sal (SALONPAS) 3.11-22-08 % PTCH Apply 1 patch topically every morning.  . cephALEXin (KEFLEX) 500 MG capsule Take 1 capsule (500 mg total) by mouth 2 (two) times daily for 7 days.  . cholecalciferol (VITAMIN D3) 25 MCG (1000 UNIT) tablet Take 1,000 Units by mouth daily.  Marland Kitchen donepezil (ARICEPT) 10 MG tablet Take 10 mg by mouth at bedtime. Before Bedtime.  . Ensure (ENSURE) Take 237 mLs by mouth daily.  . memantine (NAMENDA) 10 MG tablet Take 10 mg by mouth 2 (two) times daily.  . mirtazapine (REMERON) 7.5 MG tablet Take 1 tablet (7.5 mg total) by mouth at bedtime.  . sertraline (ZOLOFT) 25 MG tablet Take 25 mg by mouth at bedtime.   No facility-administered encounter medications on file as of 01/18/2021.    Review of Systems  Constitutional: Negative for appetite change, chills, fatigue and fever.  HENT: Negative for congestion, postnasal drip, rhinorrhea, sinus pressure, sinus pain, sneezing and sore throat.   Eyes: Negative for pain, discharge, redness and itching.  Respiratory: Negative for cough, chest tightness, shortness of breath and wheezing.   Cardiovascular: Negative for chest pain, palpitations and leg swelling.  Gastrointestinal: Negative for abdominal distention, abdominal pain, constipation, diarrhea, nausea and vomiting.  Endocrine: Negative for cold intolerance, heat intolerance, polydipsia, polyphagia and polyuria.  Genitourinary: Negative for difficulty urinating, dysuria, flank pain, frequency and urgency.  Musculoskeletal: Positive for arthralgias and gait problem.  Neurological: Negative for dizziness, speech difficulty, weakness, light-headedness, numbness and headaches.  Hematological: Does not bruise/bleed  easily.  Psychiatric/Behavioral: Negative for agitation, behavioral problems and sleep disturbance. The patient is not nervous/anxious.     Immunization History  Administered Date(s) Administered  . PFIZER(Purple Top)SARS-COV-2 Vaccination 12/29/2019, 01/23/2020   Pertinent  Health Maintenance Due  Topic Date Due  . DEXA SCAN  Never done  . PNA vac Low Risk Adult (1 of 2 - PCV13) Never done  . INFLUENZA VACCINE  02/14/2021 (Originally 06/17/2020)   Fall Risk  11/02/2020 10/17/2020 07/27/2020 07/03/2020  Falls in the past year? 0 0 1 0  Number falls in past yr: 0 0 0 0  Injury with Fall? 0 0 0 0   Functional Status Survey:    Vitals:   01/18/21 1024  BP: 120/80  Pulse: 71  Resp: 16  Temp: (!) 97.3 F (36.3 C)  SpO2: 91%  Weight: 145 lb (65.8 kg)  Height: _0  (1.676 m)   Body mass index is 23.4 kg/m. Physical Exam Vitals reviewed.  Constitutional:      General: She is not in acute  distress.    Appearance: She is normal weight. She is not ill-appearing.  HENT:     Head: Normocephalic.     Right Ear: Tympanic membrane, ear canal and external ear normal. There is no impacted cerumen.     Left Ear: Tympanic membrane, ear canal and external ear normal. There is no impacted cerumen.     Nose: Nose normal. No congestion or rhinorrhea.     Mouth/Throat:     Mouth: Mucous membranes are moist.     Pharynx: Oropharynx is clear. No oropharyngeal exudate or posterior oropharyngeal erythema.  Eyes:     General: No scleral icterus.       Right eye: No discharge.        Left eye: No discharge.     Extraocular Movements: Extraocular movements intact.     Conjunctiva/sclera: Conjunctivae normal.     Pupils: Pupils are equal, round, and reactive to light.  Neck:     Vascular: No carotid bruit.  Cardiovascular:     Rate and Rhythm: Normal rate and regular rhythm.     Pulses: Normal pulses.     Heart sounds: Normal heart sounds. No murmur heard. No friction rub. No gallop.    Pulmonary:     Effort: Pulmonary effort is normal. No respiratory distress.     Breath sounds: Normal breath sounds. No wheezing, rhonchi or rales.  Chest:     Chest wall: No tenderness.  Abdominal:     General: Bowel sounds are normal. There is no distension.     Palpations: Abdomen is soft. There is no mass.     Tenderness: There is no abdominal tenderness. There is no right CVA tenderness, left CVA tenderness, guarding or rebound.  Musculoskeletal:        General: No swelling or tenderness. Normal range of motion.     Cervical back: Normal range of motion. No rigidity or tenderness.     Right lower leg: No edema.     Left lower leg: No edema.  Lymphadenopathy:     Cervical: No cervical adenopathy.  Skin:    General: Skin is warm and dry.     Coloration: Skin is not pale.     Findings: No erythema or rash.  Neurological:     Mental Status: She is alert. Mental status is at baseline.     Cranial Nerves: No cranial nerve deficit.     Motor: No weakness.     Coordination: Coordination normal.     Gait: Gait abnormal.  Psychiatric:        Mood and Affect: Mood normal.        Speech: Speech normal.        Behavior: Behavior normal.        Cognition and Memory: Memory is impaired.     Labs reviewed: Recent Labs    07/20/20 0546 07/27/20 1506 01/15/21 1444  NA 142 144 141  K 4.2 3.8 3.8  CL 106 110 108  CO2 _0 GLUCOSE 86 90 97  BUN _1 CREATININE 0.91 1.00* 1.15*  CALCIUM 9.2 9.2 9.3  MG 1.6*  --   --    Recent Labs    02/08/20 0000 06/14/20 0000 07/03/20 1343 07/17/20 1923 07/27/20 1506  AST _2 ALT _3 ALKPHOS  --   --   --  70  --   BILITOT  --   --  0.6  0.6 0.5  PROT  --   --  6.6 7.6 5.9*  ALBUMIN 4.4 4.1  --  4.2  --    Recent Labs    07/17/20 1923 07/18/20 1136 07/19/20 0554 07/27/20 1506 01/15/21 1444  WBC 10.0   < > 7.5 10.0 7.6  NEUTROABS 7.3  --   --  7,770 5.5  HGB 14.7   < > 12.2 13.2 13.6  HCT 43.8    < > 36.5 40.5 39.9  MCV 89.6   < > 91.3 93.1 89.5  PLT 358   < > 272 316 222   < > = values in this interval not displayed.   Lab Results  Component Value Date   TSH 0.56 07/03/2020   No results found for: HGBA1C Lab Results  Component Value Date   CHOL 159 07/03/2020   HDL 45 (L) 07/03/2020   LDLCALC 88 07/03/2020   TRIG 162 (H) 07/03/2020   CHOLHDL 3.5 07/03/2020    Significant Diagnostic Results in last 30 days:  DG Chest Portable 1 View  Result Date: 01/15/2021 CLINICAL DATA:  Diminished breath sounds right lung base EXAM: PORTABLE CHEST 1 VIEW COMPARISON:  01/07/2018 FINDINGS: Single frontal view of the chest demonstrates an unremarkable cardiac silhouette. No airspace disease, effusion, or pneumothorax. Stable postsurgical changes at the left apex. There are calcified granuloma seen at the left apex and bilateral lung bases. No acute bony abnormalities. IMPRESSION: 1. No acute intrathoracic process. Electronically Signed   By: Randa Ngo M.D.   On: 01/15/2021 15:27    Assessment/Plan  1. Symptoms of urinary tract infection Afebrile.recent urine culture indicated multiple species possible contamination. - continue on Keflex  - POC Urinalysis Dipstick indicates yellow cloudy urine positive for moderate blood,protein and moderate leukocytes.will send for culture.made aware that cultures will take 3 days then will call with results. - Advised son to notify provider if symptoms worsen or running any fever or chills. - Encouraged to increase water intake to 6-8 glasses daily.  - Culture, Urine  2. Generalized weakness Afebrile. - unclear etiology.status post ED treated with IVF for dehydration and UTI still on Keflex  - CBC with Differential/Platelet - CMP with eGFR([facility])  Family/ staff Communication: Reviewed plan of care with patient  Labs/tests ordered:  - CBC with Differential/Platelet - CMP with eGFR([facility]) - Culture, Urine  Next Appointment: As needed if  symptoms worsen or fail to improve.  Sandrea Hughs, NP

## 2021-01-18 NOTE — Patient Instructions (Signed)
Drink 6-8 glasses of water daily  Urinary Tract Infection, Adult A urinary tract infection (UTI) is an infection of any part of the urinary tract. The urinary tract includes:  The kidneys.  The ureters.  The bladder.  The urethra. These organs make, store, and get rid of pee (urine) in the body. What are the causes? This infection is caused by germs (bacteria) in your genital area. These germs grow and cause swelling (inflammation) of your urinary tract. What increases the risk? The following factors may make you more likely to develop this condition:  Using a small, thin tube (catheter) to drain pee.  Not being able to control when you pee or poop (incontinence).  Being female. If you are female, these things can increase the risk: ? Using these methods to prevent pregnancy:  A medicine that kills sperm (spermicide).  A device that blocks sperm (diaphragm). ? Having low levels of a female hormone (estrogen). ? Being pregnant. You are more likely to develop this condition if:  You have genes that add to your risk.  You are sexually active.  You take antibiotic medicines.  You have trouble peeing because of: ? A prostate that is bigger than normal, if you are female. ? A blockage in the part of your body that drains pee from the bladder. ? A kidney stone. ? A nerve condition that affects your bladder. ? Not getting enough to drink. ? Not peeing often enough.  You have other conditions, such as: ? Diabetes. ? A weak disease-fighting system (immune system). ? Sickle cell disease. ? Gout. ? Injury of the spine. What are the signs or symptoms? Symptoms of this condition include:  Needing to pee right away.  Peeing small amounts often.  Pain or burning when peeing.  Blood in the pee.  Pee that smells bad or not like normal.  Trouble peeing.  Pee that is cloudy.  Fluid coming from the vagina, if you are female.  Pain in the belly or lower back. Other  symptoms include:  Vomiting.  Not feeling hungry.  Feeling mixed up (confused). This may be the first symptom in older adults.  Being tired and grouchy (irritable).  A fever.  Watery poop (diarrhea). How is this treated?  Taking antibiotic medicine.  Taking other medicines.  Drinking enough water. In some cases, you may need to see a specialist. Follow these instructions at home: Medicines  Take over-the-counter and prescription medicines only as told by your doctor.  If you were prescribed an antibiotic medicine, take it as told by your doctor. Do not stop taking it even if you start to feel better. General instructions  Make sure you: ? Pee until your bladder is empty. ? Do not hold pee for a long time. ? Empty your bladder after sex. ? Wipe from front to back after peeing or pooping if you are a female. Use each tissue one time when you wipe.  Drink enough fluid to keep your pee pale yellow.  Keep all follow-up visits.   Contact a doctor if:  You do not get better after 1-2 days.  Your symptoms go away and then come back. Get help right away if:  You have very bad back pain.  You have very bad pain in your lower belly.  You have a fever.  You have chills.  You feeling like you will vomit or you vomit. Summary  A urinary tract infection (UTI) is an infection of any part of the urinary  tract.  This condition is caused by germs in your genital area.  There are many risk factors for a UTI.  Treatment includes antibiotic medicines.  Drink enough fluid to keep your pee pale yellow. This information is not intended to replace advice given to you by your health care provider. Make sure you discuss any questions you have with your health care provider. Document Revised: 06/15/2020 Document Reviewed: 06/15/2020 Elsevier Patient Education  2021 [employer].

## 2021-01-19 LAB — CBC WITH DIFFERENTIAL/PLATELET
Absolute Monocytes: 468 cells/uL (ref 200–950)
Basophils Absolute: 23 cells/uL (ref 0–200)
Basophils Relative: 0.3 %
Eosinophils Absolute: 70 cells/uL (ref 15–500)
Eosinophils Relative: 0.9 %
HCT: 39.6 % (ref 35.0–45.0)
Hemoglobin: 13.2 g/dL (ref 11.7–15.5)
Lymphs Abs: 1817 cells/uL (ref 850–3900)
MCH: 30.3 pg (ref 27.0–33.0)
MCHC: 33.3 g/dL (ref 32.0–36.0)
MCV: 91 fL (ref 80.0–100.0)
MPV: 11.4 fL (ref 7.5–12.5)
Monocytes Relative: 6 %
Neutro Abs: 5421 cells/uL (ref 1500–7800)
Neutrophils Relative %: 69.5 %
Platelets: 185 10*3/uL (ref 140–400)
RBC: 4.35 10*6/uL (ref 3.80–5.10)
RDW: 13.1 % (ref 11.0–15.0)
Total Lymphocyte: 23.3 %
WBC: 7.8 10*3/uL (ref 3.8–10.8)

## 2021-01-19 LAB — COMPLETE METABOLIC PANEL WITH GFR
AG Ratio: 2 (calc) (ref 1.0–2.5)
ALT: 9 U/L (ref 6–29)
AST: 14 U/L (ref 10–35)
Albumin: 4 g/dL (ref 3.6–5.1)
Alkaline phosphatase (APISO): 68 U/L (ref 37–153)
BUN/Creatinine Ratio: 14 (calc) (ref 6–22)
BUN: 16 mg/dL (ref 7–25)
CO2: 21 mmol/L (ref 20–32)
Calcium: 9.3 mg/dL (ref 8.6–10.4)
Chloride: 112 mmol/L — ABNORMAL HIGH (ref 98–110)
Creat: 1.11 mg/dL — ABNORMAL HIGH (ref 0.60–0.88)
GFR, Est African American: 54 mL/min/{1.73_m2} — ABNORMAL LOW (ref >=60)
GFR, Est Non African American: 47 mL/min/{1.73_m2} — ABNORMAL LOW (ref >=60)
Globulin: 2 g/dL (calc) (ref 1.9–3.7)
Glucose, Bld: 87 mg/dL (ref 65–139)
Potassium: 4.4 mmol/L (ref 3.5–5.3)
Sodium: 144 mmol/L (ref 135–146)
Total Bilirubin: 0.5 mg/dL (ref 0.2–1.2)
Total Protein: 6 g/dL — ABNORMAL LOW (ref 6.1–8.1)

## 2021-01-19 LAB — URINE CULTURE
MICRO NUMBER:: 11609402
Result:: NO GROWTH
SPECIMEN QUALITY:: ADEQUATE

## 2021-02-11 ENCOUNTER — Emergency Department (HOSPITAL_BASED_OUTPATIENT_CLINIC_OR_DEPARTMENT_OTHER): Payer: Medicare Other

## 2021-02-11 ENCOUNTER — Emergency Department (HOSPITAL_BASED_OUTPATIENT_CLINIC_OR_DEPARTMENT_OTHER)
Admission: EM | Admit: 2021-02-11 | Discharge: 2021-02-12 | Disposition: A | Discharge status: Home / Self Care | Payer: Medicare Other | Attending: Emergency Medicine | Admitting: Emergency Medicine

## 2021-02-11 ENCOUNTER — Encounter (HOSPITAL_BASED_OUTPATIENT_CLINIC_OR_DEPARTMENT_OTHER): Payer: Self-pay | Admitting: *Deleted

## 2021-02-11 ENCOUNTER — Other Ambulatory Visit: Payer: Self-pay

## 2021-02-11 DIAGNOSIS — I951 Orthostatic hypotension: Secondary | ICD-10-CM | POA: Diagnosis not present

## 2021-02-11 DIAGNOSIS — N183 Chronic kidney disease, stage 3 unspecified: Secondary | ICD-10-CM | POA: Diagnosis not present

## 2021-02-11 DIAGNOSIS — I129 Hypertensive chronic kidney disease with stage 1 through stage 4 chronic kidney disease, or unspecified chronic kidney disease: Secondary | ICD-10-CM | POA: Insufficient documentation

## 2021-02-11 DIAGNOSIS — F0390 Unspecified dementia without behavioral disturbance: Secondary | ICD-10-CM | POA: Insufficient documentation

## 2021-02-11 DIAGNOSIS — Z87891 Personal history of nicotine dependence: Secondary | ICD-10-CM | POA: Diagnosis not present

## 2021-02-11 DIAGNOSIS — R42 Dizziness and giddiness: Secondary | ICD-10-CM | POA: Diagnosis not present

## 2021-02-11 LAB — CBC
HCT: 41.7 % (ref 36.0–46.0)
Hemoglobin: 14 g/dL (ref 12.0–15.0)
MCH: 30.9 pg (ref 26.0–34.0)
MCHC: 33.6 g/dL (ref 30.0–36.0)
MCV: 92.1 fL (ref 80.0–100.0)
Platelets: 281 10*3/uL (ref 150–400)
RBC: 4.53 MIL/uL (ref 3.87–5.11)
RDW: 12.7 % (ref 11.5–15.5)
WBC: 9.7 10*3/uL (ref 4.0–10.5)
nRBC: 0 % (ref 0.0–0.2)

## 2021-02-11 LAB — URINALYSIS, MICROSCOPIC (REFLEX): RBC / HPF: NONE SEEN RBC/hpf (ref 0–5)

## 2021-02-11 LAB — BASIC METABOLIC PANEL
Anion gap: 9 (ref 5–15)
BUN: 24 mg/dL — ABNORMAL HIGH (ref 8–23)
CO2: 25 mmol/L (ref 22–32)
Calcium: 9.4 mg/dL (ref 8.9–10.3)
Chloride: 103 mmol/L (ref 98–111)
Creatinine, Ser: 1.37 mg/dL — ABNORMAL HIGH (ref 0.44–1.00)
GFR, Estimated: 39 mL/min — ABNORMAL LOW (ref >60)
Glucose, Bld: 97 mg/dL (ref 70–99)
Potassium: 4.7 mmol/L (ref 3.5–5.1)
Sodium: 137 mmol/L (ref 135–145)

## 2021-02-11 LAB — URINALYSIS, ROUTINE W REFLEX MICROSCOPIC
Bilirubin Urine: NEGATIVE
Glucose, UA: NEGATIVE mg/dL
Hgb urine dipstick: NEGATIVE
Ketones, ur: NEGATIVE mg/dL
Nitrite: NEGATIVE
Protein, ur: NEGATIVE mg/dL
Specific Gravity, Urine: >1.03 — ABNORMAL HIGH (ref 1.005–1.030)
pH: 5.5 (ref 5.0–8.0)

## 2021-02-11 MED ORDER — FOSFOMYCIN TROMETHAMINE 3 G PO PACK
3.0000 g | PACK | Freq: Once | ORAL | Status: AC
  Administered 2021-02-11: 3 g via ORAL
  Filled 2021-02-11: qty 3

## 2021-02-11 MED ORDER — SODIUM CHLORIDE 0.9 % IV BOLUS
500.0000 mL | Freq: Once | INTRAVENOUS | Status: AC
  Administered 2021-02-11: 500 mL via INTRAVENOUS

## 2021-02-11 NOTE — ED Triage Notes (Signed)
Pt. Reports she is having to get up at night and go to the restroom to go [place] more often in the night.  Pt. [place] Spaniel this has been going on recently.  Pt. Reports she feels weak when she gets up and walks around too much.  Pt. Grand daughter said this happened 5-6 mths ago and it turned out to be a UTI and the Pt was admitted.  Pt. Is exhibiting the same symptoms and grand daughter sees Pt. The same.  Pt. Has no deficits in triage.  Pt. Has history of Dementia per grand daughter.

## 2021-02-12 ENCOUNTER — Encounter (HOSPITAL_BASED_OUTPATIENT_CLINIC_OR_DEPARTMENT_OTHER): Payer: Self-pay | Admitting: Emergency Medicine

## 2021-02-12 NOTE — ED Provider Notes (Signed)
[facility] EMERGENCY DEPARTMENT Provider Note   CSN: [id number] Arrival date & time: 02/11/21  1947     History Chief Complaint  Patient presents with  . Dizziness    Christina Perkins is a 80 y.o. female.  The history is provided by the patient and a relative.  Illness Location:  Bladder Quality:  Urinating more often thatn usual and also with lightheadedness Severity:  Mild Onset quality:  Gradual Timing:  Constant Progression:  Unchanged Chronicity:  Recurrent Context:  H/o UTI and dizziness.   Relieved by:  Nothing.  Worsened by:  Time Ineffective treatments:  None tried Associated symptoms: no abdominal pain, no chest pain, no congestion, no cough, no diarrhea, no ear pain, no fatigue, no fever, no headaches, no loss of consciousness, no myalgias, no nausea, no rash, no rhinorrhea, no shortness of breath, no sore throat, no vomiting and no wheezing   Risk factors:  Elderly  Family is concerned patient has a severe UTI requiring admission.  No CP, no SOB.  No cough, no n/v/d.  No speech difficulty. No weakness, no numbness.       Past Medical History:  Diagnosis Date  . Abnormal chest x-ray   . Abnormal results of liver function studies   . Abnormal thyroid screen (blood)   . Acute sinusitis   . Alcohol screening   . Anemia, deficiency   . Arthropathy   . BMI 22.0-22.9, adult   . Carotid stenosis, bilateral   . Carotid stenosis, bilateral   . Cellulitis   . Chronic kidney disease, stage III (moderate) (HCC)   . Closed fracture of sacrum and coccyx, with routine healing, subsequent encounter   . Colon cancer screening   . Colon polyp   . Dementia (HCC)   . Dizzy spells   . Encounter for screening mammogram for breast cancer   . Facet arthropathy, lumbar   . Fatty pancreas   . Generalized abdominal or pelvic swelling or mass or lump   . Glaucoma screening   . H/O mammogram 2019   Per [facility] new patient packet  . Hepatomegalia   . High risk  medication use   . Hospital discharge follow-up   . Hx of colonoscopy 2016   Per [facility] new patient packet  . Hypertension   . Hypertriglyceridemia   . Influenza vaccination declined by patient   . Low back pain   . Lumbar disc disease with radiculopathy   . Lung nodule   . Magnesium deficiency   . Medical non-compliance   . Menopause   . Microscopic hematuria   . Mixed dyslipidemia   . Neurodegenerative cognitive impairment (HCC)   . Osteoarthritis, hip, bilateral   . Overweight   . Personal history of smoking   . Plantar fasciitis   . Poor appetite   . Refusal of influenza vaccine by provider   . Renal cyst, left   . Right atrial enlargement   . Right hip pain   . Sciatica of right side   . Screening for depression   . Screening for HIV (human immunodeficiency virus)   . Screening for STDs (sexually transmitted diseases)   . Senile dementia without behavioral disturbance (HCC)   . Shortness of breath   . Spondylolisthesis, grade 1   . Unintentional weight loss   . URI, acute   . Urinary frequency   . UTI (urinary tract infection), bacterial   . Vitamin D deficiency     Patient Active Problem List  Diagnosis Date Noted  . Acute bilateral low back pain without sciatica 11/08/2020  . Malnutrition of moderate degree 07/19/2020  . Weakness 07/18/2020  . Generalized weakness 07/18/2020  . Acute lower UTI 07/18/2020  . Fall at home, initial encounter 07/18/2020  . Hypokalemia 07/18/2020  . Avascular necrosis of femoral head (HCC) 07/18/2020  . Essential hypertension 07/18/2020  . Injury of toe on right foot 11/07/2013  . Edema of toe 11/07/2013  . Pain in toe of right foot 11/07/2013    History reviewed. No pertinent surgical history.   OB History   No obstetric history on file.     Family History  Problem Relation Age of Onset  . Dementia Mother   . Alzheimer's disease Mother   . Hypertension Mother   . Dementia Father   . Blindness Father   .  Hypertension Father   . Stroke Father     Social History   Tobacco Use  . Smoking status: Former Games developer  . Smokeless tobacco: Never Used  Substance Use Topics  . Alcohol use: Not Currently    Comment: 6 glasses of wine a week.  . Drug use: No    Home Medications Prior to Admission medications   Medication Sig Start Date End Date Taking? Authorizing Provider  acetaminophen (TYLENOL) 500 MG tablet Take 2 tablets (1,000 mg total) by mouth in the morning and at bedtime. 07/27/20   Ngetich, Dinah C, NP  atorvastatin (LIPITOR) 10 MG tablet Take 1 tablet (10 mg total) by mouth at bedtime. 10/17/20   Ngetich, Dinah C, NP  Camphor-Menthol-Methyl Sal (SALONPAS) 3.11-22-08 % [facility] Apply 1 patch topically every morning. 11/02/20   Ngetich, Dinah C, NP  cholecalciferol (VITAMIN D3) 25 MCG (1000 UNIT) tablet Take 1,000 Units by mouth daily.    [provider]  donepezil (ARICEPT) 10 MG tablet Take 10 mg by mouth at bedtime. Before Bedtime.    [provider]  Ensure (ENSURE) Take 237 mLs by mouth daily.    [provider]  memantine (NAMENDA) 10 MG tablet Take 10 mg by mouth 2 (two) times daily.    [provider]  mirtazapine (REMERON) 7.5 MG tablet Take 1 tablet (7.5 mg total) by mouth at bedtime. 10/17/20   Ngetich, Dinah C, NP  sertraline (ZOLOFT) 25 MG tablet Take 25 mg by mouth at bedtime.    [provider]    Allergies    Patient has no known allergies.  Review of Systems   Review of Systems  Constitutional: Negative for fatigue and fever.  HENT: Negative for congestion, ear pain, rhinorrhea and sore throat.   Eyes: Negative for visual disturbance.  Respiratory: Negative for cough, shortness of breath and wheezing.   Cardiovascular: Negative for chest pain.  Gastrointestinal: Negative for abdominal pain, diarrhea, nausea and vomiting.  Genitourinary: Negative for difficulty urinating.  Musculoskeletal: Negative for myalgias.  Skin: Negative  for rash.  Neurological: Positive for light-headedness. Negative for tremors, seizures, loss of consciousness, syncope, speech difficulty, weakness, numbness and headaches.  Psychiatric/Behavioral: Negative for agitation.  All other systems reviewed and are negative.   Physical Exam Updated Vital Signs BP 138/79 (BP Location: Right Arm)   Pulse 67   Temp 97.6 F (36.4 C) (Oral)   Resp 15   Ht 5\' 6"  (1.676 m)   Wt 65.8 kg   SpO2 100%   BMI 23.40 kg/m   Physical Exam Vitals and nursing note reviewed.  Constitutional:      General: She  is not in acute distress.    Appearance: Normal appearance.  HENT:     Head: Normocephalic and atraumatic.     Nose: Nose normal.     Mouth/Throat:     Mouth: Mucous membranes are moist.     Pharynx: Oropharynx is clear.  Eyes:     Extraocular Movements: Extraocular movements intact.     Conjunctiva/sclera: Conjunctivae normal.     Pupils: Pupils are equal, round, and reactive to light.  Cardiovascular:     Rate and Rhythm: Normal rate and regular rhythm.     Pulses: Normal pulses.     Heart sounds: Normal heart sounds.  Pulmonary:     Effort: Pulmonary effort is normal.     Breath sounds: Normal breath sounds.  Abdominal:     General: Abdomen is flat. Bowel sounds are normal.     Palpations: Abdomen is soft.     Tenderness: There is no abdominal tenderness. There is no guarding.  Musculoskeletal:        General: Normal range of motion.     Cervical back: Normal range of motion and neck supple.  Skin:    General: Skin is warm and dry.     Capillary Refill: Capillary refill takes less than 2 seconds.  Neurological:     General: No focal deficit present.     Mental Status: She is alert and oriented to person, place, and time.     Cranial Nerves: No cranial nerve deficit.     Deep Tendon Reflexes: Reflexes normal.  Psychiatric:        Mood and Affect: Mood normal.        Behavior: Behavior normal.     ED Results / Procedures /  Treatments   Labs (all labs ordered are listed, but only abnormal results are displayed) Results for orders placed or performed during the hospital encounter of 02/11/21  Urinalysis, Routine w reflex microscopic Urine, Clean Catch  Result Value Ref Range   Color, Urine YELLOW YELLOW   APPearance CLOUDY (A) CLEAR   Specific Gravity, Urine >1.030 (H) 1.005 - 1.030   pH 5.5 5.0 - 8.0   Glucose, UA NEGATIVE NEGATIVE mg/dL   Hgb urine dipstick NEGATIVE NEGATIVE   Bilirubin Urine NEGATIVE NEGATIVE   Ketones, ur NEGATIVE NEGATIVE mg/dL   Protein, ur NEGATIVE NEGATIVE mg/dL   Nitrite NEGATIVE NEGATIVE   Leukocytes,Ua TRACE (A) NEGATIVE  Basic metabolic panel  Result Value Ref Range   Sodium 137 135 - 145 mmol/L   Potassium 4.7 3.5 - 5.1 mmol/L   Chloride 103 98 - 111 mmol/L   CO2 25 22 - 32 mmol/L   Glucose, Bld 97 70 - 99 mg/dL   BUN 24 (H) 8 - 23 mg/dL   Creatinine, Ser 1.611.37 (H) 0.44 - 1.00 mg/dL   Calcium 9.4 8.9 - 09.610.3 mg/dL   GFR, Estimated 39 (L) >60 mL/min   Anion gap 9 5 - 15  CBC  Result Value Ref Range   WBC 9.7 4.0 - 10.5 K/uL   RBC 4.53 3.87 - 5.11 MIL/uL   Hemoglobin 14.0 12.0 - 15.0 g/dL   HCT 04.541.7 40.936.0 - 81.146.0 %   MCV 92.1 80.0 - 100.0 fL   MCH 30.9 26.0 - 34.0 pg   MCHC 33.6 30.0 - 36.0 g/dL   RDW 91.412.7 78.211.5 - 95.615.5 %   Platelets 281 150 - 400 K/uL   nRBC 0.0 0.0 - 0.2 %  Urinalysis, Microscopic (reflex)  Result Value  Ref Range   RBC / HPF NONE SEEN 0 - 5 RBC/hpf   WBC, UA 0-5 0 - 5 WBC/hpf   Bacteria, UA RARE (A) NONE SEEN   Squamous Epithelial / LPF 6-10 0 - 5   Hyaline Casts, UA PRESENT    CT Head Wo Contrast  Result Date: 02/11/2021 CLINICAL DATA:  Weakness and frequent urination, altered mental status. EXAM: CT HEAD WITHOUT CONTRAST TECHNIQUE: Contiguous axial images were obtained from the base of the skull through the vertex without intravenous contrast. COMPARISON:  Head CT July 17, 2020 FINDINGS: Brain: No evidence of acute large vascular territory  infarction, hemorrhage, hydrocephalus, extra-axial collection or mass lesion/mass effect. Stable age related global parenchymal volume loss and microvascular white matter ischemic disease. Vascular: No hyperdense vessel. Atherosclerotic calcifications in the skull base. Skull: Normal. Negative for fracture or focal lesion. Sinuses/Orbits: Paranasal sinuses and mastoid air cells are predominantly clear. Orbits are grossly unremarkable. Other: None IMPRESSION: 1. No acute intracranial abnormality. 2. Stable age related global parenchymal volume loss and microvascular white matter ischemic disease. Electronically Signed   By: Maudry Mayhew MD   On: 02/11/2021 23:24   DG Chest Portable 1 View  Result Date: 01/15/2021 CLINICAL DATA:  Diminished breath sounds right lung base EXAM: PORTABLE CHEST 1 VIEW COMPARISON:  01/07/2018 FINDINGS: Single frontal view of the chest demonstrates an unremarkable cardiac silhouette. No airspace disease, effusion, or pneumothorax. Stable postsurgical changes at the left apex. There are calcified granuloma seen at the left apex and bilateral lung bases. No acute bony abnormalities. IMPRESSION: 1. No acute intrathoracic process. Electronically Signed   By: Sharlet Salina M.D.   On: 01/15/2021 15:27     Radiology CT Head Wo Contrast  Result Date: 02/11/2021 CLINICAL DATA:  Weakness and frequent urination, altered mental status. EXAM: CT HEAD WITHOUT CONTRAST TECHNIQUE: Contiguous axial images were obtained from the base of the skull through the vertex without intravenous contrast. COMPARISON:  Head CT July 17, 2020 FINDINGS: Brain: No evidence of acute large vascular territory infarction, hemorrhage, hydrocephalus, extra-axial collection or mass lesion/mass effect. Stable age related global parenchymal volume loss and microvascular white matter ischemic disease. Vascular: No hyperdense vessel. Atherosclerotic calcifications in the skull base. Skull: Normal. Negative for fracture  or focal lesion. Sinuses/Orbits: Paranasal sinuses and mastoid air cells are predominantly clear. Orbits are grossly unremarkable. Other: None IMPRESSION: 1. No acute intracranial abnormality. 2. Stable age related global parenchymal volume loss and microvascular white matter ischemic disease. Electronically Signed   By: Maudry Mayhew MD   On: 02/11/2021 23:24    Procedures Procedures   Medications Ordered in ED Medications  fosfomycin (MONUROL) packet 3 g (3 g Oral Given 02/11/21 2330)  sodium chloride 0.9 % bolus 500 mL (0 mLs Intravenous Stopped 02/12/21 0007)    ED Course  I have reviewed the triage vital signs and the nursing notes.  Pertinent labs & imaging results that were available during my care of the patient were reviewed by me and considered in my medical decision making (see chart for details).    Orthostatic VS for the past 72 hrs (Last 3 readings):  BP Location  02/11/21 2248 Right Arm  02/11/21 2012 Left Arm   Given specific gravity of urine and orthostatic vitals,  I believe the lightheadedness is due to some degree of dehydration.  Urine was cultured but there is no large UTI requiring admission.  No signs of sepsis.  Patient was treated with fosfomycin and IVF in the  ED.  Patient should drink more fluids at home and follow up closely with PMD.    Christina Perkins was evaluated in Emergency Department on 02/12/2021 for the symptoms described in the history of present illness. She was evaluated in the context of the global COVID-19 pandemic, which necessitated consideration that the patient might be at risk for infection with the SARS-CoV-2 virus that causes COVID-19. Institutional protocols and algorithms that pertain to the evaluation of patients at risk for COVID-19 are in a state of rapid change based on information released by regulatory bodies including the CDC and federal and state organizations. These policies and algorithms were followed during the patient's care in the  ED.  Final Clinical Impression(s) / ED Diagnoses Final diagnoses:  Orthostasis    Return for intractable cough, coughing up blood, fevers >100.4 unrelieved by medication, shortness of breath, intractable vomiting, chest pain, shortness of breath, weakness, numbness, changes in speech, facial asymmetry, abdominal pain, passing out, Inability to tolerate liquids or food, cough, altered mental status or any concerns. No signs of systemic illness or infection. The patient is nontoxic-appearing on exam and vital signs are within normal limits.  I have reviewed the triage vital signs and the nursing notes. Pertinent labs & imaging results that were available during my care of the patient were reviewed by me and considered in my medical decision making (see chart for details). After history, exam, and medical workup I feel the patient has been appropriately medically screened and is safe for discharge home. Pertinent diagnoses were discussed with the patient. Patient was given return precautions.   Christina Aylesworth, MD 02/12/21 [id number]

## 2021-02-13 LAB — URINE CULTURE

## 2021-03-05 ENCOUNTER — Other Ambulatory Visit: Payer: Self-pay | Admitting: Family

## 2021-03-05 DIAGNOSIS — E785 Hyperlipidemia, unspecified: Secondary | ICD-10-CM

## 2021-03-28 ENCOUNTER — Other Ambulatory Visit: Payer: Medicare Other

## 2021-04-04 ENCOUNTER — Other Ambulatory Visit: Payer: Self-pay | Admitting: Family

## 2021-04-04 DIAGNOSIS — E785 Hyperlipidemia, unspecified: Secondary | ICD-10-CM

## 2021-04-11 ENCOUNTER — Other Ambulatory Visit: Payer: Self-pay

## 2021-04-11 ENCOUNTER — Emergency Department (HOSPITAL_COMMUNITY): Payer: Medicare Other

## 2021-04-11 ENCOUNTER — Encounter (HOSPITAL_COMMUNITY): Payer: Self-pay | Admitting: *Deleted

## 2021-04-11 ENCOUNTER — Emergency Department (HOSPITAL_COMMUNITY)
Admission: EM | Admit: 2021-04-11 | Discharge: 2021-04-11 | Disposition: A | Discharge status: Home / Self Care | Payer: Medicare Other | Attending: Emergency Medicine | Admitting: Emergency Medicine

## 2021-04-11 DIAGNOSIS — Z79899 Other long term (current) drug therapy: Secondary | ICD-10-CM | POA: Insufficient documentation

## 2021-04-11 DIAGNOSIS — Z20822 Contact with and (suspected) exposure to covid-19: Secondary | ICD-10-CM | POA: Insufficient documentation

## 2021-04-11 DIAGNOSIS — R531 Weakness: Secondary | ICD-10-CM | POA: Diagnosis not present

## 2021-04-11 DIAGNOSIS — F0390 Unspecified dementia without behavioral disturbance: Secondary | ICD-10-CM | POA: Insufficient documentation

## 2021-04-11 DIAGNOSIS — I517 Cardiomegaly: Secondary | ICD-10-CM | POA: Diagnosis not present

## 2021-04-11 DIAGNOSIS — Z87891 Personal history of nicotine dependence: Secondary | ICD-10-CM | POA: Insufficient documentation

## 2021-04-11 DIAGNOSIS — I129 Hypertensive chronic kidney disease with stage 1 through stage 4 chronic kidney disease, or unspecified chronic kidney disease: Secondary | ICD-10-CM | POA: Insufficient documentation

## 2021-04-11 DIAGNOSIS — N183 Chronic kidney disease, stage 3 unspecified: Secondary | ICD-10-CM | POA: Insufficient documentation

## 2021-04-11 DIAGNOSIS — R42 Dizziness and giddiness: Secondary | ICD-10-CM | POA: Diagnosis not present

## 2021-04-11 DIAGNOSIS — R059 Cough, unspecified: Secondary | ICD-10-CM | POA: Diagnosis not present

## 2021-04-11 LAB — URINALYSIS, ROUTINE W REFLEX MICROSCOPIC
Bilirubin Urine: NEGATIVE
Glucose, UA: NEGATIVE mg/dL
Hgb urine dipstick: NEGATIVE
Ketones, ur: NEGATIVE mg/dL
Leukocytes,Ua: NEGATIVE
Nitrite: NEGATIVE
Protein, ur: NEGATIVE mg/dL
Specific Gravity, Urine: 1.025 (ref 1.005–1.030)
pH: 5 (ref 5.0–8.0)

## 2021-04-11 LAB — RAPID URINE DRUG SCREEN, HOSP PERFORMED
Amphetamines: NOT DETECTED
Barbiturates: NOT DETECTED
Benzodiazepines: NOT DETECTED
Cocaine: NOT DETECTED
Opiates: NOT DETECTED
Tetrahydrocannabinol: NOT DETECTED

## 2021-04-11 LAB — COMPREHENSIVE METABOLIC PANEL
ALT: 18 U/L (ref 0–44)
AST: 18 U/L (ref 15–41)
Albumin: 3.9 g/dL (ref 3.5–5.0)
Alkaline Phosphatase: 68 U/L (ref 38–126)
Anion gap: 8 (ref 5–15)
BUN: 26 mg/dL — ABNORMAL HIGH (ref 8–23)
CO2: 24 mmol/L (ref 22–32)
Calcium: 9.3 mg/dL (ref 8.9–10.3)
Chloride: 110 mmol/L (ref 98–111)
Creatinine, Ser: 1.22 mg/dL — ABNORMAL HIGH (ref 0.44–1.00)
GFR, Estimated: 45 mL/min — ABNORMAL LOW (ref >60)
Glucose, Bld: 90 mg/dL (ref 70–99)
Potassium: 4.4 mmol/L (ref 3.5–5.1)
Sodium: 142 mmol/L (ref 135–145)
Total Bilirubin: 0.7 mg/dL (ref 0.3–1.2)
Total Protein: 6.6 g/dL (ref 6.5–8.1)

## 2021-04-11 LAB — CBC WITH DIFFERENTIAL/PLATELET
Abs Immature Granulocytes: 0.01 10*3/uL (ref 0.00–0.07)
Basophils Absolute: 0 10*3/uL (ref 0.0–0.1)
Basophils Relative: 0 %
Eosinophils Absolute: 0.1 10*3/uL (ref 0.0–0.5)
Eosinophils Relative: 1 %
HCT: 42.2 % (ref 36.0–46.0)
Hemoglobin: 13.8 g/dL (ref 12.0–15.0)
Immature Granulocytes: 0 %
Lymphocytes Relative: 28 %
Lymphs Abs: 2.1 10*3/uL (ref 0.7–4.0)
MCH: 30.7 pg (ref 26.0–34.0)
MCHC: 32.7 g/dL (ref 30.0–36.0)
MCV: 94 fL (ref 80.0–100.0)
Monocytes Absolute: 0.5 10*3/uL (ref 0.1–1.0)
Monocytes Relative: 7 %
Neutro Abs: 4.6 10*3/uL (ref 1.7–7.7)
Neutrophils Relative %: 64 %
Platelets: 221 10*3/uL (ref 150–400)
RBC: 4.49 MIL/uL (ref 3.87–5.11)
RDW: 12.5 % (ref 11.5–15.5)
WBC: 7.3 10*3/uL (ref 4.0–10.5)
nRBC: 0 % (ref 0.0–0.2)

## 2021-04-11 LAB — RESP PANEL BY RT-PCR (FLU A&B, COVID) ARPGX2
Influenza A by PCR: NEGATIVE
Influenza B by PCR: NEGATIVE
SARS Coronavirus 2 by RT PCR: NEGATIVE

## 2021-04-11 MED ORDER — SODIUM CHLORIDE 0.9 % IV BOLUS
1000.0000 mL | Freq: Once | INTRAVENOUS | Status: AC
  Administered 2021-04-11: 1000 mL via INTRAVENOUS

## 2021-04-11 MED ORDER — SODIUM CHLORIDE 0.9 % IV BOLUS
500.0000 mL | Freq: Once | INTRAVENOUS | Status: AC
  Administered 2021-04-11: 500 mL via INTRAVENOUS

## 2021-04-11 NOTE — ED Notes (Signed)
purwick placed

## 2021-04-11 NOTE — ED Provider Notes (Signed)
Emergency Medicine Provider Triage Evaluation Note  Christina Perkins , a 80 y.o. female  was evaluated in triage.  Pt complains of generalized weakness and decreased oral intake.  Found to be alert to person and place.  Patient's son reports this is her baseline.  Patient's daughter reports that generalized weakness started yesterday.  Patient has had decreased oral intake since yesterday as well.  Patient reports that patient has had similar symptoms when she has had urinary tract infections in the past.  Review of Systems  Positive: Generalized weakness, decreased oral intake Negative: Fever, vomiting, diarrhea  Physical Exam  BP 105/68 (BP Location: Left Arm)   Pulse 70   Temp 98.3 F (36.8 C) (Oral)   Resp 18   SpO2 99%  Gen:   Awake, no distress   Resp:  Normal effort, lungs clear to auscultation bilaterally MSK:   Moves extremities without difficulty  Other:    Medical Decision Making  Medically screening exam initiated at 1:00 PM.  Appropriate orders placed.  Jacqueline Spofford was informed that the remainder of the evaluation will be completed by another provider, this initial triage assessment does not replace that evaluation, and the importance of remaining in the ED until their evaluation is complete.  The patient appears stable so that the remainder of the work up may be completed by another provider.      Haskel Schroeder, PA-C 04/11/21 1301    Mancel Bale, MD 04/12/21 [phone]

## 2021-04-11 NOTE — ED Notes (Signed)
Pt ambulated to the restroom with standby assist. No issues observed during ambulation. Gait slow, but steady.

## 2021-04-11 NOTE — Discharge Instructions (Addendum)
You were seen in the ER for generalized weakness  Labs, imaging all look normal

## 2021-04-11 NOTE — ED Provider Notes (Signed)
[facility]-EMERGENCY DEPT Provider Note   CSN: [id number] Arrival date & time: 04/11/21  1214     History Chief Complaint  Patient presents with  . Weakness    Christina Perkins is a 80 y.o. female with history of Alzheimer's, hypertension, recurrent UTIs, chronic hip pain brought to the ED by his son for evaluation of generalized weakness.  She was last seen normal on Sunday.  Level 5 caveat due to dementia.  Patient is able to tell me her full name, but she is in the hospital but thinks it is [id number].  She tells me she is lightheaded when she stands up and feels weak.  Denies any pain.  She is complaining of hip pain.  Most of history is obtained from patient's son at bedside.  States he last saw her on Sunday.  Over the last couple of days he has noticed that patient is very tremulous when she tries to stand, shaky.  She was not able to get out of bed this morning.  He has not noticed one-sided weakness but feels like she is just weak all over.  She could not even keep her eyes open or feed herself with a fork this morning.  Decreased appetite.  Son feels like her talking is quieter but no slurred speech. She complained of a mild sore throat and he gave her Dukes Magic mouthwash, warm honey with lemon and patient states her sore throat is now better.  Son states that she has become weak like this twice before and she had a UTI and was dehydrated.  No fever, vomiting, cough, diarrhea.  Patient denies burning with urination.  No falls at home.  Typically patient is able to get up unassisted from bed, will occasionally use her walker but a lot of times will walk independently.  She lives with a relative but son states that relative mostly stays in her room and does not provide any care on a day-to-day basis.  Patient's daughters and son check in on her almost daily to help her as needed.  No oral anticoagulants.  No interventions.  No modifying factors.  HPI     Past Medical  History:  Diagnosis Date  . Abnormal chest x-ray   . Abnormal results of liver function studies   . Abnormal thyroid screen (blood)   . Acute sinusitis   . Alcohol screening   . Anemia, deficiency   . Arthropathy   . BMI 22.0-22.9, adult   . Carotid stenosis, bilateral   . Carotid stenosis, bilateral   . Cellulitis   . Chronic kidney disease, stage III (moderate) (HCC)   . Closed fracture of sacrum and coccyx, with routine healing, subsequent encounter   . Colon cancer screening   . Colon polyp   . Dementia (HCC)   . Dizzy spells   . Encounter for screening mammogram for breast cancer   . Facet arthropathy, lumbar   . Fatty pancreas   . Generalized abdominal or pelvic swelling or mass or lump   . Glaucoma screening   . H/O mammogram 2019   Per [facility] new patient packet  . Hepatomegalia   . High risk medication use   . Hospital discharge follow-up   . Hx of colonoscopy 2016   Per [facility] new patient packet  . Hypertension   . Hypertriglyceridemia   . Influenza vaccination declined by patient   . Low back pain   . Lumbar disc disease with radiculopathy   . Lung  nodule   . Magnesium deficiency   . Medical non-compliance   . Menopause   . Microscopic hematuria   . Mixed dyslipidemia   . Neurodegenerative cognitive impairment (HCC)   . Osteoarthritis, hip, bilateral   . Overweight   . Personal history of smoking   . Plantar fasciitis   . Poor appetite   . Refusal of influenza vaccine by provider   . Renal cyst, left   . Right atrial enlargement   . Right hip pain   . Sciatica of right side   . Screening for depression   . Screening for HIV (human immunodeficiency virus)   . Screening for STDs (sexually transmitted diseases)   . Senile dementia without behavioral disturbance (HCC)   . Shortness of breath   . Spondylolisthesis, grade 1   . Unintentional weight loss   . URI, acute   . Urinary frequency   . UTI (urinary tract infection), bacterial   . Vitamin D  deficiency     Patient Active Problem List   Diagnosis Date Noted  . Acute bilateral low back pain without sciatica 11/08/2020  . Malnutrition of moderate degree 07/19/2020  . Weakness 07/18/2020  . Generalized weakness 07/18/2020  . Acute lower UTI 07/18/2020  . Fall at home, initial encounter 07/18/2020  . Hypokalemia 07/18/2020  . Avascular necrosis of femoral head (HCC) 07/18/2020  . Essential hypertension 07/18/2020  . Injury of toe on right foot 11/07/2013  . Edema of toe 11/07/2013  . Pain in toe of right foot 11/07/2013    History reviewed. No pertinent surgical history.   OB History   No obstetric history on file.     Family History  Problem Relation Age of Onset  . Dementia Mother   . Alzheimer's disease Mother   . Hypertension Mother   . Dementia Father   . Blindness Father   . Hypertension Father   . Stroke Father     Social History   Tobacco Use  . Smoking status: Former Games developer  . Smokeless tobacco: Never Used  Substance Use Topics  . Alcohol use: Not Currently    Comment: 6 glasses of wine a week.  . Drug use: No    Home Medications Prior to Admission medications   Medication Sig Start Date End Date Taking? Authorizing Provider  acetaminophen (TYLENOL) 500 MG tablet Take 2 tablets (1,000 mg total) by mouth in the morning and at bedtime. 07/27/20   Ngetich, Dinah C, NP  atorvastatin (LIPITOR) 10 MG tablet TAKE 1 TABLET BY MOUTH AT BEDTIME 04/05/21   Ngetich, Dinah C, NP  Camphor-Menthol-Methyl Sal (SALONPAS) 3.11-22-08 % PT[facility] Apply 1 patch topically every morning. 11/02/20   Ngetich, Dinah C, NP  cholecalciferol (VITAMIN D3) 25 MCG (1000 UNIT) tablet Take 1,000 Units by mouth daily.    [provider]  donepezil (ARICEPT) 10 MG tablet Take 10 mg by mouth at bedtime. Before Bedtime.    [provider]  Ensure (ENSURE) Take 237 mLs by mouth daily.    [provider]  memantine (NAMENDA) 10 MG tablet Take 10 mg by mouth 2 (two)  times daily.    [provider]  mirtazapine (REMERON) 7.5 MG tablet Take 1 tablet (7.5 mg total) by mouth at bedtime. 10/17/20   Ngetich, Dinah C, NP  sertraline (ZOLOFT) 25 MG tablet Take 25 mg by mouth at bedtime.    [provider]    Allergies    Patient has no known allergies.  Review  of Systems   Review of Systems  Neurological: Positive for weakness (generalized) and light-headedness.  All other systems reviewed and are negative.   Physical Exam Updated Vital Signs BP (!) 182/84   Pulse (!) 54   Temp (S) 98.3 F (36.8 C) (Rectal)   Resp 12   SpO2 100%   Physical Exam Vitals and nursing note reviewed.  Constitutional:      Appearance: She is well-developed.     Comments: Non toxic in NAD, looks sleepy. Falling asleep during encounter   HENT:     Head: Normocephalic and atraumatic.     Nose: Nose normal.     Mouth/Throat:     Mouth: Mucous membranes are moist.  Eyes:     Conjunctiva/sclera: Conjunctivae normal.  Cardiovascular:     Rate and Rhythm: Normal rate and regular rhythm.     Comments: No lower extremity edema.  1+ radial and DP pulses bilaterally Pulmonary:     Effort: Pulmonary effort is normal.     Breath sounds: Normal breath sounds.  Abdominal:     General: Bowel sounds are normal.     Palpations: Abdomen is soft.     Tenderness: There is no abdominal tenderness.     Comments: No G/R/R. No suprapubic or CVA tenderness. Negative Murphy's and McBurney's. Active BS to lower quadrants.   Musculoskeletal:        General: Normal range of motion.     Cervical back: Normal range of motion.  Skin:    General: Skin is warm and dry.     Capillary Refill: Capillary refill takes less than 2 seconds.  Neurological:     Mental Status: She is alert.     Comments:   Mental Status: Patient is awake, alert, oriented to her name, hospital. Disoriented to year. Speech is slowed but clear, without dysarthria or aphasia.  No signs of  neglect.  Cranial Nerves: I not tested II temporal visual fields full bilaterally. PERRL.   III, IV, VI EOMs intact without ptosis V sensation to light touch intact in all 3 divisions of trigeminal nerve bilaterally  VII facial movements symmetric bilaterally VIII hearing intact to voice/conversation  IX, X no uvula deviation, symmetric rise of soft palate/uvula XI 5/5 SCM and trapezius strength bilaterally  XII tongue protrusion midline, symmetric L/R movements  Motor: Strength 5/5 in upper/lower extremities.   Sensation to light touch intact in face, upper/lower extremities. No pronator drift. No leg drop.  Cerebellar: No ataxia with finger to nose. I attempted to ambulate patient. She did not need assistance in swinging her legs over the bed and sitting on the side of the bed, but felt too lightheaded and could not stand up to walk.  No truncal sway.   Psychiatric:        Behavior: Behavior normal.     ED Results / Procedures / Treatments   Labs (all labs ordered are listed, but only abnormal results are displayed) Labs Reviewed  COMPREHENSIVE METABOLIC PANEL - Abnormal; Notable for the following components:      Result Value   BUN 26 (*)    Creatinine, Ser 1.22 (*)    GFR, Estimated 45 (*)    All other components within normal limits  RESP PANEL BY RT-PCR (FLU A&B, COVID) ARPGX2  URINE CULTURE  CBC WITH DIFFERENTIAL/PLATELET  URINALYSIS, ROUTINE W REFLEX MICROSCOPIC  RAPID URINE DRUG SCREEN, HOSP PERFORMED    EKG EKG Interpretation  Date/Time:  Thursday Apr 11 2021 17:00:47 EDT  Ventricular Rate:  56 PR Interval:  174 QRS Duration: 90 QT Interval:  395 QTC Calculation: 382 R Axis:   68 Text Interpretation: Sinus rhythm Consider right atrial enlargement Borderline T wave abnormalities Minimal ST elevation, anterior leads No significant change since last tracing Confirmed by Susy Frizzle [phone]) on 04/11/2021 5:16:42 PM   Radiology CT Head Wo  Contrast  Result Date: 04/11/2021 CLINICAL DATA:  Altered mental status, worsening weakness since yesterday EXAM: CT HEAD WITHOUT CONTRAST TECHNIQUE: Contiguous axial images were obtained from the base of the skull through the vertex without intravenous contrast. Sagittal and coronal MPR images reconstructed from axial data set. COMPARISON:  02/11/2021 FINDINGS: Brain: Generalized atrophy. Normal ventricular morphology. No midline shift or mass effect. Mild small vessel chronic ischemic changes of deep cerebral white matter. No intracranial hemorrhage, mass lesion, or evidence of acute infarction. No extra-axial fluid collections. Vascular: No hyperdense vessels. Minimal atherosclerotic calcification of internal carotid arteries at skull base Skull: Intact Sinuses/Orbits: Clear Other: N/A IMPRESSION: Atrophy with mild small vessel chronic ischemic changes of deep cerebral white matter. No acute intracranial abnormalities. Electronically Signed   By: Ulyses Southward M.D.   On: 04/11/2021 17:05   DG Chest Portable 1 View  Result Date: 04/11/2021 CLINICAL DATA:  Cough. EXAM: PORTABLE CHEST 1 VIEW COMPARISON:  01/15/2021. FINDINGS: Mediastinum hilar structures normal. Borderline cardiomegaly. No pulmonary venous congestion. Postsurgical changes left lung with associated left-sided pleuroparenchymal thickening consistent scarring. No acute pulmonary disease noted. Mild thoracic spine scoliosis. IMPRESSION: 1. Postsurgical changes left lung with pleuroparenchymal scarring. No acute pulmonary disease. 2.  Borderline cardiomegaly.  No pulmonary venous congestion. Electronically Signed   By: Maisie Fus  Register   On: 04/11/2021 13:59    Procedures Procedures   Medications Ordered in ED Medications  sodium chloride 0.9 % bolus 500 mL (0 mLs Intravenous Stopped 04/11/21 1702)  sodium chloride 0.9 % bolus 1,000 mL (1,000 mLs Intravenous New Bag/Given (Non-Interop) 04/11/21 1757)    ED Course  I have reviewed the  triage vital signs and the nursing notes.  Pertinent labs & imaging results that were available during my care of the patient were reviewed by me and considered in my medical decision making (see chart for details).  Clinical Course as of 04/11/21 1948  Thu Apr 11, 2021  1710 CT Head Wo Contrast IMPRESSION: Atrophy with mild small vessel chronic ischemic changes of deep cerebral white matter.  No acute intracranial abnormalities. [CG]    Clinical Course User Index [CG] Jerrell Mylar   MDM Rules/Calculators/A&P                           80 y.o. yo female presents to the ED for generalized weakness, difficulty walking. History of dementia.   Additional information obtained from chart, nursing and triage notes review  Obtained additional information from patient's son, see above.   Chart review reveals - seen recently for orthostasis, UTI.  Ordered lab, imaging were personally reviewed and interpreted  Labs reveal - vastly reassuring and essentially normal. Negative RVP. UA normal. Normal WBC, hemoglobin.    Imaging reveals - non acute CT head and chest x-ray. EKG without significant changes.   Medicines ordered - 1.5 L IVF  ED course & MDM 1945: Patient has been re-evaluated several times.  Responded well to IVF. Ambulated to the bathroom with standby assist/RN.  Vitals remain normal.  Patient states she feels a little bit better.  Given reassuring work up, response to IVF suspect dehydration vs orthostasis.  Feel patient is appropriate for discharge with close PCP follow up.  Stressed importance of 24/7 care at home given history of Alzheimer's.  Son states they have gotten social work involved with PCP. Will place home health orders here. May benefit from home RN or aid vs SNF/memory care. Discussed with EDP Bernette Mayers.   Portions of this note were generated with Scientist, clinical (histocompatibility and immunogenetics). Dictation errors may occur despite best attempts at proofreading    Final  Clinical Impression(s) / ED Diagnoses Final diagnoses:  Generalized weakness    Rx / DC Orders ED Discharge Orders         Ordered    Home Health        04/11/21 1948    Face-to-face encounter (required for Medicare/Medicaid patients)       Comments: I Liberty Handy certify that this patient is under my care and that I, or a nurse practitioner or physician's assistant working with me, had a face-to-face encounter that meets the physician face-to-face encounter requirements with this patient on 04/11/2021. The encounter with the patient was in whole, or in part for the following medical condition(s) which is the primary reason for home health care (List medical condition): dementia, fall risk   04/11/21 1948           Liberty Handy, PA-C 04/11/21 1948    Pollyann Savoy, MD 04/11/21 2234

## 2021-04-11 NOTE — ED Triage Notes (Signed)
Pt son reports the pt has been weak and has not been able to eat or walk with her walker as usual. He first noticed this yesterday. The son reports the pt lives with a relative that does not check on the patient often. Symptoms are similar to when she was dehydrated with UTI. Also she complained of a sore throat.

## 2021-04-11 NOTE — ED Notes (Signed)
Patient transported to CT 

## 2021-04-13 LAB — URINE CULTURE: Culture: NO GROWTH

## 2021-04-19 ENCOUNTER — Other Ambulatory Visit: Payer: Self-pay | Admitting: *Deleted

## 2021-04-19 DIAGNOSIS — E785 Hyperlipidemia, unspecified: Secondary | ICD-10-CM

## 2021-04-19 MED ORDER — ATORVASTATIN CALCIUM 10 MG PO TABS: 1.0000 | ORAL_TABLET | Freq: Every day | ORAL | 1 refills | Status: DC

## 2021-04-19 NOTE — Telephone Encounter (Signed)
[facility] 

## 2021-05-01 ENCOUNTER — Other Ambulatory Visit: Payer: Medicare Other

## 2021-05-01 ENCOUNTER — Other Ambulatory Visit: Payer: Self-pay

## 2021-05-01 DIAGNOSIS — I10 Essential (primary) hypertension: Secondary | ICD-10-CM

## 2021-05-01 DIAGNOSIS — E785 Hyperlipidemia, unspecified: Secondary | ICD-10-CM | POA: Diagnosis not present

## 2021-05-02 LAB — COMPLETE METABOLIC PANEL WITH GFR
AG Ratio: 2.1 (calc) (ref 1.0–2.5)
ALT: 12 U/L (ref 6–29)
AST: 14 U/L (ref 10–35)
Albumin: 4.1 g/dL (ref 3.6–5.1)
Alkaline phosphatase (APISO): 72 U/L (ref 37–153)
BUN/Creatinine Ratio: 15 (calc) (ref 6–22)
BUN: 19 mg/dL (ref 7–25)
CO2: 26 mmol/L (ref 20–32)
Calcium: 9.6 mg/dL (ref 8.6–10.4)
Chloride: 109 mmol/L (ref 98–110)
Creat: 1.29 mg/dL — ABNORMAL HIGH (ref 0.60–0.88)
GFR, Est African American: 45 mL/min/{1.73_m2} — ABNORMAL LOW (ref >=60)
GFR, Est Non African American: 39 mL/min/{1.73_m2} — ABNORMAL LOW (ref >=60)
Globulin: 2 g/dL (calc) (ref 1.9–3.7)
Glucose, Bld: 85 mg/dL (ref 65–99)
Potassium: 4.4 mmol/L (ref 3.5–5.3)
Sodium: 144 mmol/L (ref 135–146)
Total Bilirubin: 0.5 mg/dL (ref 0.2–1.2)
Total Protein: 6.1 g/dL (ref 6.1–8.1)

## 2021-05-02 LAB — CBC WITH DIFFERENTIAL/PLATELET
Absolute Monocytes: 347 cells/uL (ref 200–950)
Basophils Absolute: 19 cells/uL (ref 0–200)
Basophils Relative: 0.3 %
Eosinophils Absolute: 82 cells/uL (ref 15–500)
Eosinophils Relative: 1.3 %
HCT: 42 % (ref 35.0–45.0)
Hemoglobin: 13.9 g/dL (ref 11.7–15.5)
Lymphs Abs: 1896 cells/uL (ref 850–3900)
MCH: 30.8 pg (ref 27.0–33.0)
MCHC: 33.1 g/dL (ref 32.0–36.0)
MCV: 93.1 fL (ref 80.0–100.0)
MPV: 10 fL (ref 7.5–12.5)
Monocytes Relative: 5.5 %
Neutro Abs: 3956 cells/uL (ref 1500–7800)
Neutrophils Relative %: 62.8 %
Platelets: 217 10*3/uL (ref 140–400)
RBC: 4.51 10*6/uL (ref 3.80–5.10)
RDW: 12.1 % (ref 11.0–15.0)
Total Lymphocyte: 30.1 %
WBC: 6.3 10*3/uL (ref 3.8–10.8)

## 2021-05-02 LAB — LIPID PANEL
Cholesterol: 124 mg/dL (ref <200)
HDL: 51 mg/dL (ref >=50)
LDL Cholesterol (Calc): 59 mg/dL (calc)
Non-HDL Cholesterol (Calc): 73 mg/dL (calc) (ref <130)
Total CHOL/HDL Ratio: 2.4 (calc) (ref <5.0)
Triglycerides: 55 mg/dL (ref <150)

## 2021-05-02 LAB — TSH: TSH: 1.43 mIU/L (ref 0.40–4.50)

## 2021-05-06 ENCOUNTER — Ambulatory Visit (INDEPENDENT_AMBULATORY_CARE_PROVIDER_SITE_OTHER): Payer: Medicare Other | Admitting: Family

## 2021-05-06 ENCOUNTER — Encounter: Payer: Self-pay | Admitting: Family

## 2021-05-06 ENCOUNTER — Other Ambulatory Visit: Payer: Self-pay

## 2021-05-06 VITALS — BP 138/88 | HR 81 | Temp 97.3°F | Resp 16 | Ht 66.0 in | Wt 137.8 lb

## 2021-05-06 DIAGNOSIS — I10 Essential (primary) hypertension: Secondary | ICD-10-CM | POA: Diagnosis not present

## 2021-05-06 DIAGNOSIS — G301 Alzheimer's disease with late onset: Secondary | ICD-10-CM

## 2021-05-06 DIAGNOSIS — E785 Hyperlipidemia, unspecified: Secondary | ICD-10-CM | POA: Diagnosis not present

## 2021-05-06 DIAGNOSIS — Z780 Asymptomatic menopausal state: Secondary | ICD-10-CM | POA: Diagnosis not present

## 2021-05-06 DIAGNOSIS — R634 Abnormal weight loss: Secondary | ICD-10-CM

## 2021-05-06 DIAGNOSIS — F0280 Dementia in other diseases classified elsewhere without behavioral disturbance: Secondary | ICD-10-CM

## 2021-05-06 DIAGNOSIS — Z23 Encounter for immunization: Secondary | ICD-10-CM | POA: Diagnosis not present

## 2021-05-06 MED ORDER — MIRTAZAPINE 7.5 MG PO TABS: 7.5000 mg | ORAL_TABLET | Freq: Every day | ORAL | 5 refills | Status: DC

## 2021-05-06 MED ORDER — MIRTAZAPINE 7.5 MG PO TABS
7.5000 mg | ORAL_TABLET | Freq: Every day | ORAL | 5 refills | Status: DC
Start: 2021-05-06 — End: 2021-09-20

## 2021-05-06 MED ORDER — TETANUS-DIPHTH-ACELL PERTUSSIS 5-2.5-18.5 LF-MCG/0.5 IM SUSP: 0.5000 mL | Freq: Once | INTRAMUSCULAR | 0 refills | Status: AC

## 2021-05-06 NOTE — Progress Notes (Signed)
Provider: Marlowe Sax FNP-C   Trinton Prewitt, Nelda Bucks, NP  Patient Care Team: Haevyn Ury, Nelda Bucks, NP as PCP - General (Family Medicine)  Extended Emergency Contact Information Primary Emergency Contact: Domino, [place] Mobile Phone: [phone] Relation: Son Secondary Emergency Contact: Lang Snow Mobile Phone: [phone] Relation: Daughter  Code Status:  Full Code  Goals of care: Advanced Directive information Advanced Directives 05/06/2021  Does Patient Have a Medical Advance Directive? Yes  Type of Advance Directive Living will  Does patient want to make changes to medical advance directive? No - Patient declined  Copy of [place]o in Chart? -  Would patient like information on creating a medical advance directive? -     Chief Complaint  Patient presents with   Medical Management of Chronic Issues    4 month follow up.   Health Maintenance    Discuss the need for Dexa Scan.   Immunizations    Discuss the need for Shingrix vaccine, PNA vaccine, and Covid Booster.    HPI:  Pt is a 80 y.o. female seen today for 4 month follow up for medical management of chronic diseases. Here with daughter. States continues to skips meals breakfast,lunch but eat s dinner.daughter tries to remind to make a sandwich at lunch but does not.she assist with evening meal.Daughter plans to get an assistant to her patient with her ADL's and encouraging her to eat. Has had 7.2 lbs weight loss over 3 months.currently off Remeron did not call office for refills. Also off her Zoloft states has not taken for several months.denies any feels of depression.Daughter reports no mood swings or agitation.  Tdap  COVID-19 booster  will get vaccine at her pharmacy  Agrees to receive PNA  23 today  Bone density - will call to reschedule appointment.  Past Medical History:  Diagnosis Date   Abnormal chest x-ray    Abnormal results of liver function studies    Abnormal thyroid screen  (blood)    Acute sinusitis    Alcohol screening    Anemia, deficiency    Arthropathy    BMI 22.0-22.9, adult    Carotid stenosis, bilateral    Carotid stenosis, bilateral    Cellulitis    Chronic kidney disease, stage III (moderate) (HCC)    Closed fracture of sacrum and coccyx, with routine healing, subsequent encounter    Colon cancer screening    Colon polyp    Dementia ([place])    Dizzy spells    Encounter for screening mammogram for breast cancer    Facet arthropathy, lumbar    Fatty pancreas    Generalized abdominal or pelvic swelling or mass or lump    Glaucoma screening    H/O mammogram 2019   Per [facility] new patient packet   Hepatomegalia    High risk medication use    Hospital discharge follow-up    Hx of colonoscopy 2016   Per [facility] new patient packet   Hypertension    Hypertriglyceridemia    Influenza vaccination declined by patient    Low back pain    Lumbar disc disease with radiculopathy    Lung nodule    Magnesium deficiency    Medical non-compliance    Menopause    Microscopic hematuria    Mixed dyslipidemia    Neurodegenerative cognitive impairment ([place])    Osteoarthritis, hip, bilateral    Overweight    Personal history of smoking    Plantar fasciitis    Poor appetite  Refusal of influenza vaccine by provider    Renal cyst, left    Right atrial enlargement    Right hip pain    Sciatica of right side    Screening for depression    Screening for HIV (human immunodeficiency virus)    Screening for STDs (sexually transmitted diseases)    Senile dementia without behavioral disturbance (HCC)    Shortness of breath    Spondylolisthesis, grade 1    Unintentional weight loss    URI, acute    Urinary frequency    UTI (urinary tract infection), bacterial    Vitamin D deficiency    History reviewed. No pertinent surgical history.  No Known Allergies  Allergies as of 05/06/2021   No Known Allergies      Medication List        Accurate as of May 06, 2021 11:19 AM. If you have any questions, ask your nurse or doctor.          STOP taking these medications    mirtazapine 7.5 MG tablet Commonly known as: REMERON Stopped by: Sandrea Hughs, NP   sertraline 25 MG tablet Commonly known as: ZOLOFT Stopped by: Sandrea Hughs, NP       TAKE these medications    acetaminophen 500 MG tablet Commonly known as: TYLENOL Take 2 tablets (1,000 mg total) by mouth in the morning and at bedtime.   atorvastatin 10 MG tablet Commonly known as: LIPITOR Take 1 tablet (10 mg total) by mouth at bedtime.   cholecalciferol 25 MCG (1000 UNIT) tablet Commonly known as: VITAMIN D3 Take 1,000 Units by mouth daily.   donepezil 10 MG tablet Commonly known as: ARICEPT Take 10 mg by mouth at bedtime. Before Bedtime.   Ensure Take 237 mLs by mouth daily.   memantine 10 MG tablet Commonly known as: NAMENDA Take 10 mg by mouth 2 (two) times daily.   Salonpas 3.11-22-08 % Ptch Generic drug: Camphor-Menthol-Methyl Sal Apply 1 patch topically every morning.        Review of Systems  Constitutional:  Positive for unexpected weight change. Negative for appetite change, chills, fatigue and fever.       Weight loss 7.2 lbs   HENT:  Negative for congestion, dental problem, ear discharge, ear pain, facial swelling, hearing loss, nosebleeds, postnasal drip, rhinorrhea, sinus pressure, sinus pain, sneezing, sore throat, tinnitus and trouble swallowing.   Eyes:  Negative for pain, discharge, redness, itching and visual disturbance.  Respiratory:  Negative for cough, chest tightness, shortness of breath and wheezing.   Cardiovascular:  Negative for chest pain, palpitations and leg swelling.  Gastrointestinal:  Negative for abdominal distention, abdominal pain, blood in stool, constipation, diarrhea, nausea and vomiting.  Endocrine: Negative for cold intolerance, heat intolerance, polydipsia, polyphagia and polyuria.  Genitourinary:  Negative for  difficulty urinating, dysuria, flank pain, frequency and urgency.  Musculoskeletal:  Negative for arthralgias, back pain, gait problem, joint swelling, myalgias, neck pain and neck stiffness.  Skin:  Negative for color change, pallor, rash and wound.  Neurological:  Negative for dizziness, syncope, speech difficulty, weakness, light-headedness, numbness and headaches.  Hematological:  Does not bruise/bleed easily.  Psychiatric/Behavioral:  Negative for agitation, behavioral problems, confusion, hallucinations, self-injury, sleep disturbance and suicidal ideas. The patient is not nervous/anxious.        Memory loss    Immunization History  Administered Date(s) Administered   PFIZER(Purple Top)SARS-COV-2 Vaccination 12/29/2019, 01/23/2020   Pertinent  Health Maintenance Due  Topic Date Due  DEXA SCAN  Never done   PNA vac Low Risk Adult (1 of 2 - PCV13) Never done   INFLUENZA VACCINE  06/17/2021   Fall Risk  05/06/2021 11/02/2020 10/17/2020 07/27/2020 07/03/2020  Falls in the past year? 0 0 0 1 0  Number falls in past yr: 0 0 0 0 0  Injury with Fall? 0 0 0 0 0  Risk for fall due to : No Fall Risks - - - -   Functional Status Survey:    Vitals:   05/06/21 1107  BP: 138/88  Pulse: 81  Resp: 16  Temp: (!) 97.3 F (36.3 C)  SpO2: 98%  Weight: 137 lb 12.8 oz (62.5 kg)  Height: 5' 6"  (1.676 m)   Body mass index is 22.24 kg/m. Physical Exam Vitals reviewed.  Constitutional:      General: She is not in acute distress.    Appearance: Normal appearance. She is normal weight. She is not ill-appearing or diaphoretic.  HENT:     Head: Normocephalic.     Right Ear: Tympanic membrane, ear canal and external ear normal. There is no impacted cerumen.     Left Ear: Tympanic membrane, ear canal and external ear normal. There is no impacted cerumen.     Nose: Nose normal. No congestion or rhinorrhea.     Mouth/Throat:     Mouth: Mucous membranes are moist.     Pharynx: Oropharynx is clear.  No oropharyngeal exudate or posterior oropharyngeal erythema.  Eyes:     General: No scleral icterus.       Right eye: No discharge.        Left eye: No discharge.     Extraocular Movements: Extraocular movements intact.     Conjunctiva/sclera: Conjunctivae normal.     Pupils: Pupils are equal, round, and reactive to light.  Neck:     Vascular: No carotid bruit.  Cardiovascular:     Rate and Rhythm: Normal rate and regular rhythm.     Pulses: Normal pulses.     Heart sounds: Normal heart sounds. No murmur heard.   No friction rub. No gallop.  Pulmonary:     Effort: Pulmonary effort is normal. No respiratory distress.     Breath sounds: Normal breath sounds. No wheezing, rhonchi or rales.  Chest:     Chest wall: No tenderness.  Abdominal:     General: Bowel sounds are normal. There is no distension.     Palpations: Abdomen is soft. There is no mass.     Tenderness: There is no abdominal tenderness. There is no right CVA tenderness, left CVA tenderness, guarding or rebound.  Musculoskeletal:        General: No swelling or tenderness.     Cervical back: Normal range of motion. No rigidity or tenderness.     Right lower leg: No edema.     Left lower leg: No edema.     Comments: Unsteady gait   Lymphadenopathy:     Cervical: No cervical adenopathy.  Skin:    General: Skin is warm and dry.     Coloration: Skin is not pale.     Findings: No bruising, erythema, lesion or rash.  Neurological:     Mental Status: She is alert and oriented to person, place, and time.     Cranial Nerves: No cranial nerve deficit.     Sensory: No sensory deficit.     Motor: No weakness.     Coordination: Coordination normal.  Gait: Gait abnormal.  Psychiatric:        Mood and Affect: Mood normal.        Speech: Speech normal.        Behavior: Behavior normal.        Thought Content: Thought content normal.        Cognition and Memory: Memory is impaired.        Judgment: Judgment normal.     Labs reviewed: Recent Labs    07/20/20 0546 07/27/20 1506 02/11/21 2045 04/11/21 1254 05/01/21 1003  NA 142   < > 137 142 144  K 4.2   < > 4.7 4.4 4.4  CL 106   < > 103 110 109  CO2 26   < > 25 24 26   GLUCOSE 86   < > 97 90 85  BUN 15   < > 24* 26* 19  CREATININE 0.91   < > 1.37* 1.22* 1.29*  CALCIUM 9.2   < > 9.4 9.3 9.6  MG 1.6*  --   --   --   --    < > = values in this interval not displayed.   Recent Labs    06/14/20 0000 07/03/20 1343 07/17/20 1923 07/27/20 1506 01/18/21 0000 04/11/21 1254 05/01/21 1003  AST 22   < > 17   < > 14 18 14   ALT 13   < > 10   < > 9 18 12   ALKPHOS  --   --  70  --   --  68  --   BILITOT  --    < > 0.6   < > 0.5 0.7 0.5  PROT  --    < > 7.6   < > 6.0* 6.6 6.1  ALBUMIN 4.1  --  4.2  --   --  3.9  --    < > = values in this interval not displayed.   Recent Labs    01/18/21 0000 02/11/21 2045 04/11/21 1254 05/01/21 1003  WBC 7.8 9.7 7.3 6.3  NEUTROABS 5,421  --  4.6 3,956  HGB 13.2 14.0 13.8 13.9  HCT 39.6 41.7 42.2 42.0  MCV 91.0 92.1 94.0 93.1  PLT 185 281 221 217   Lab Results  Component Value Date   TSH 1.43 05/01/2021   No results found for: HGBA1C Lab Results  Component Value Date   CHOL 124 05/01/2021   HDL 51 05/01/2021   LDLCALC 59 05/01/2021   TRIG 55 05/01/2021   CHOLHDL 2.4 05/01/2021    Significant Diagnostic Results in last 30 days:  CT Head Wo Contrast  Result Date: 04/11/2021 CLINICAL DATA:  Altered mental status, worsening weakness since yesterday EXAM: CT HEAD WITHOUT CONTRAST TECHNIQUE: Contiguous axial images were obtained from the base of the skull through the vertex without intravenous contrast. Sagittal and coronal MPR images reconstructed from axial data set. COMPARISON:  02/11/2021 FINDINGS: Brain: Generalized atrophy. Normal ventricular morphology. No midline shift or mass effect. Mild small vessel chronic ischemic changes of deep cerebral white matter. No intracranial hemorrhage, mass  lesion, or evidence of acute infarction. No extra-axial fluid collections. Vascular: No hyperdense vessels. Minimal atherosclerotic calcification of internal carotid arteries at skull base Skull: Intact Sinuses/Orbits: Clear Other: N/A IMPRESSION: Atrophy with mild small vessel chronic ischemic changes of deep cerebral white matter. No acute intracranial abnormalities. Electronically Signed   By: Lavonia Dana M.D.   On: 04/11/2021 17:05   DG Chest Portable 1 View  Result  Date: 04/11/2021 CLINICAL DATA:  Cough. EXAM: PORTABLE CHEST 1 VIEW COMPARISON:  01/15/2021. FINDINGS: Mediastinum hilar structures normal. Borderline cardiomegaly. No pulmonary venous congestion. Postsurgical changes left lung with associated left-sided pleuroparenchymal thickening consistent scarring. No acute pulmonary disease noted. Mild thoracic spine scoliosis. IMPRESSION: 1. Postsurgical changes left lung with pleuroparenchymal scarring. No acute pulmonary disease. 2.  Borderline cardiomegaly.  No pulmonary venous congestion. Electronically Signed   By: Marcello Moores  Register   On: 04/11/2021 13:59    Assessment/Plan 1. Essential hypertension B/p well controlled. - CBC with Differential/Platelet; Future - CMP with eGFR([facility]); Future - TSH; Future  2. Hyperlipidemia LDL goal <100 LDL at gaol  - continue on atorvastatin  - Lipid panel; Future  3. Need for Tdap vaccination Advised to get Tdap vaccine at her pharmacy .previously send to pharmacy but unclear why she didn't get vaccine.  - Tdap ([place]) 5-2.5-18.5 LF-MCG/0.5 injection; Inject 0.5 mLs into the muscle once for 1 dose.  Dispense: 0.5 mL; Refill: 0  4. Late onset Alzheimer's disease without behavioral disturbance ([place]) No new behavioral issues reports.requires assistance to remind her to eat her meals.daughter will try to some one to assist her. Continue with support   5. Postmenopausal estrogen deficiency Will reorder bone density  - DG Bone Density;  Future  6. Abnormal weight loss Skipping meals possible due to cognitive impairment.will need an assistant  - mirtazapine (REMERON) 7.5 MG tablet; Take 1 tablet (7.5 mg total) by mouth at bedtime.  Dispense: 30 tablet; Refill: 5 - TSH; Future  7. Need for pneumococcal vaccination PNA 23 vaccine administered by CMA no reaction reported.  - Pneumococcal polysaccharide vaccine 23-valent greater than or equal to 2yo subcutaneous/IM   Family/ staff Communication: Reviewed plan of care with patient and daughter verbalized understanding.   Labs/tests ordered:  - DG Bone Density; Future - CBC with Differential/Platelet - CMP with eGFR([facility]) - TSH - Lipid panel    Next Appointment : 6 months for medical management of chronic issues.Fasting Labs prior to visit.    Sandrea Hughs, NP

## 2021-05-15 IMAGING — CT CT HEAD W/O CM
4 series · 16 of 47 positions shown, 18 images · non-contrast
Comparison: Head CT July 17, 2020

CLINICAL DATA: Weakness and frequent urination, altered mental
status.

EXAM:
CT HEAD WITHOUT CONTRAST
TECHNIQUE: Contiguous axial images were obtained from the base of the skull
through the vertex without intravenous contrast.

[Series 2: head wo · axial · 0.41mm/px · z∈[-168,-48]mm · 7 of 33 slices shown, 9 images]
[im 5/33  brain]
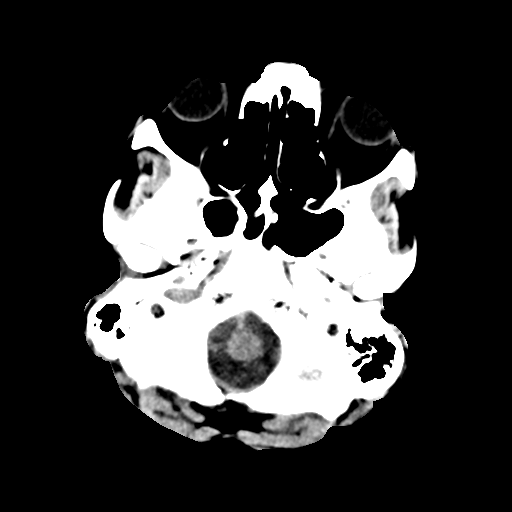
[im 5/33  bone]
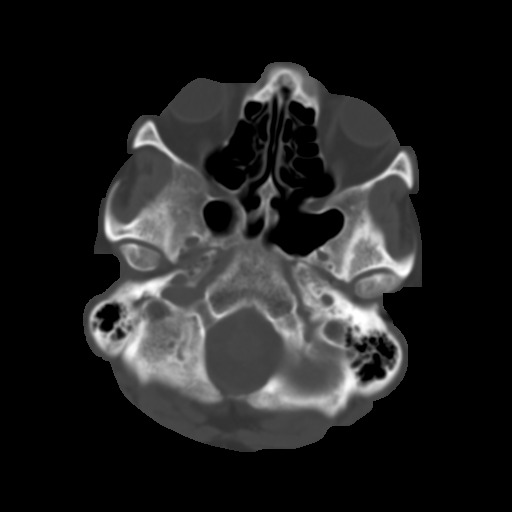
[im 9/33  brain]
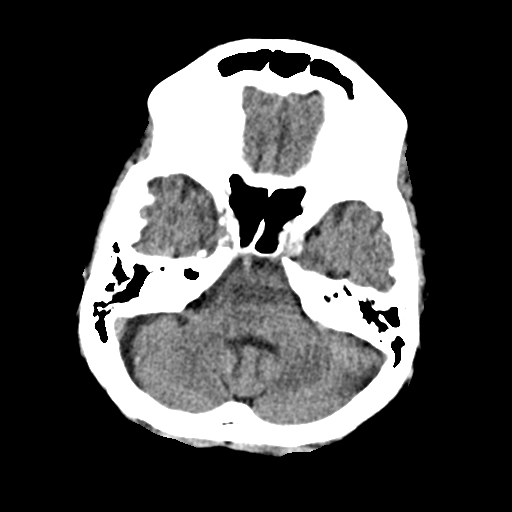
[im 13/33  brain]
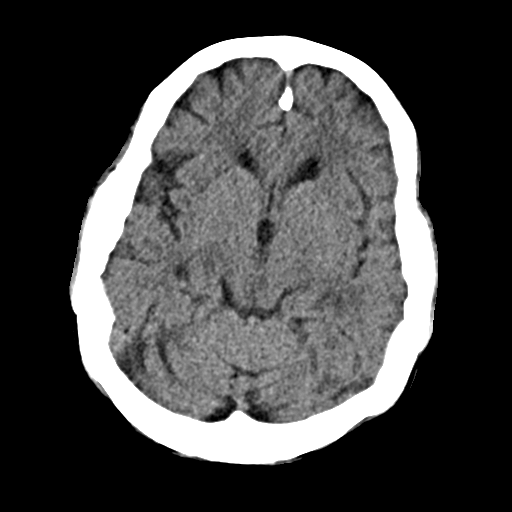
[im 17/33  brain]
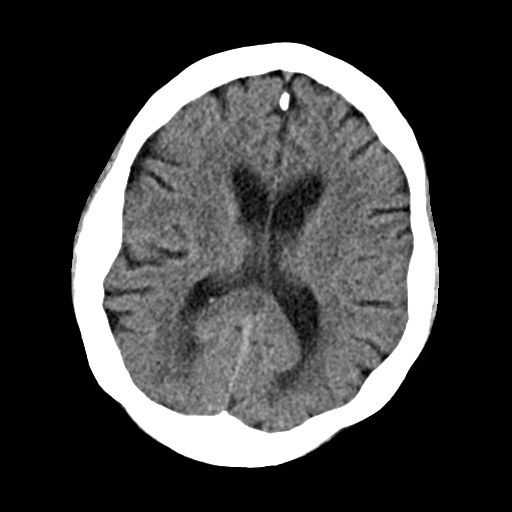
[im 21/33  brain]
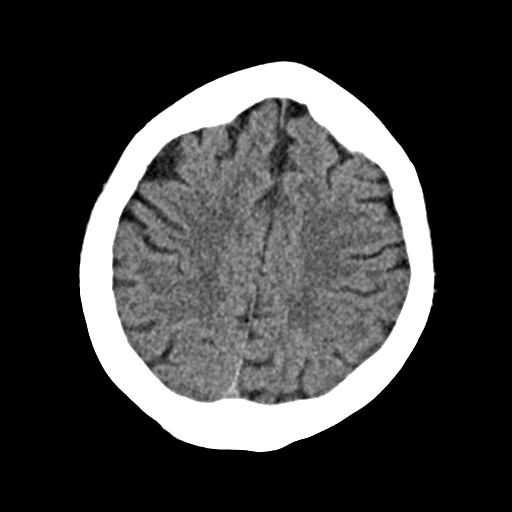
[im 21/33  bone]
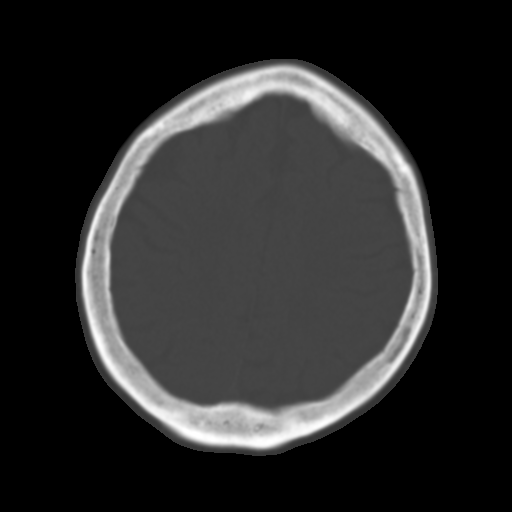
[im 25/33  brain]
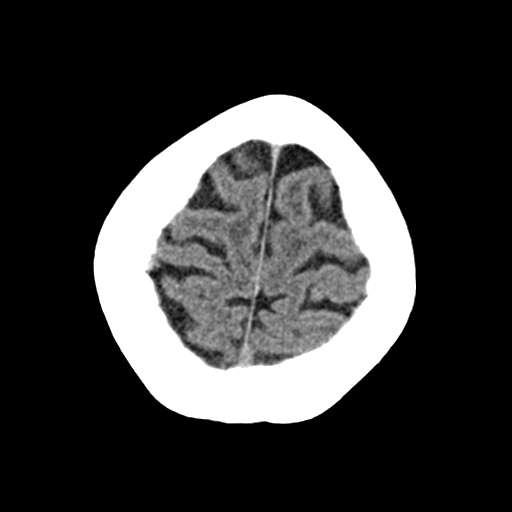
[im 29/33  brain]
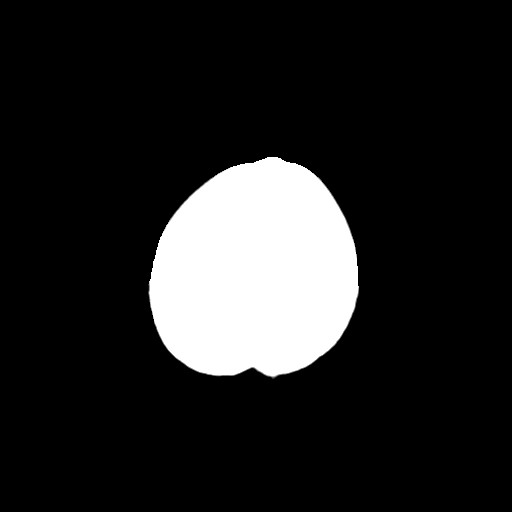

[Series 3: head bone · axial · 0.41mm/px · z∈[-172,-140]mm · 3 of 83 slices shown]
[im 9/83  bone]
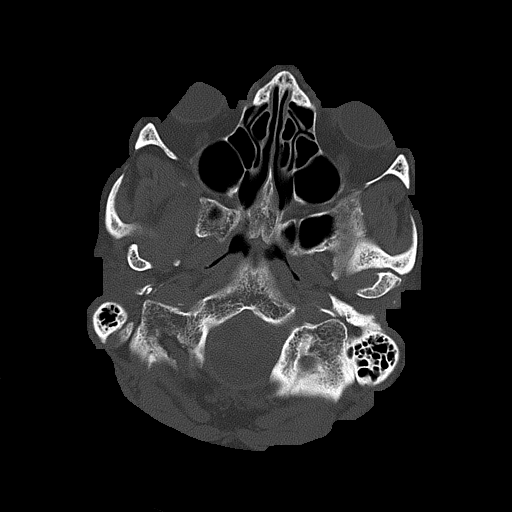
[im 17/83  bone]
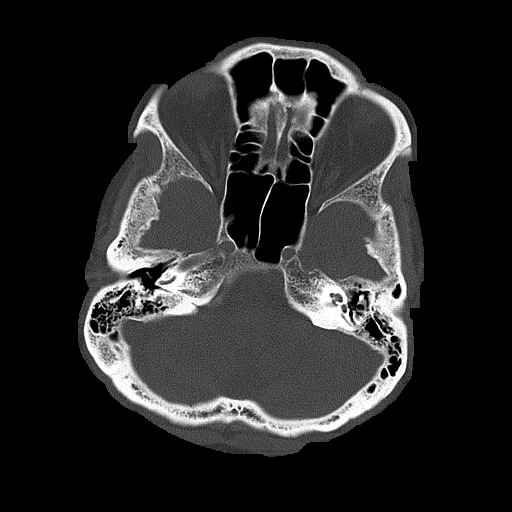
[im 25/83  bone]
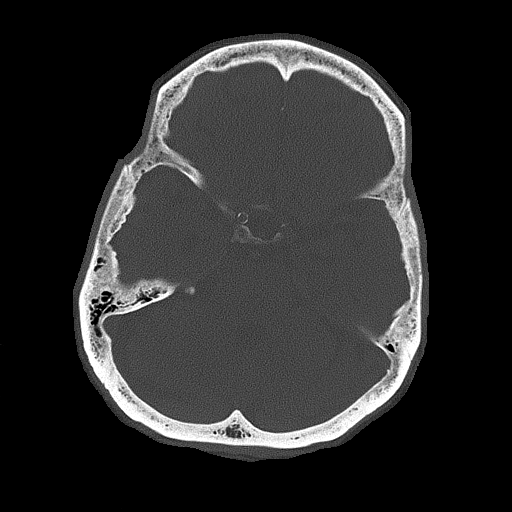

[Series 4: coronal soft · coronal · 0.34mm/px · 3 of 67 slices shown]
[im 23/67  brain]
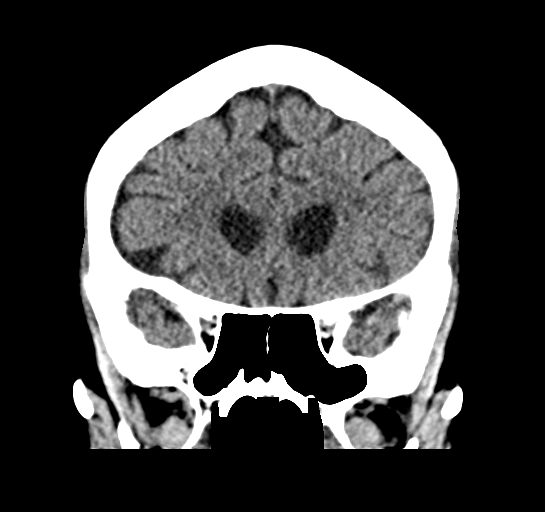
[im 30/67  brain]
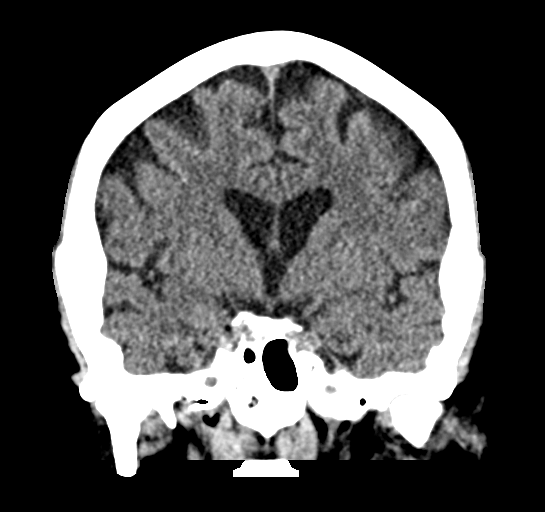
[im 37/67  brain]
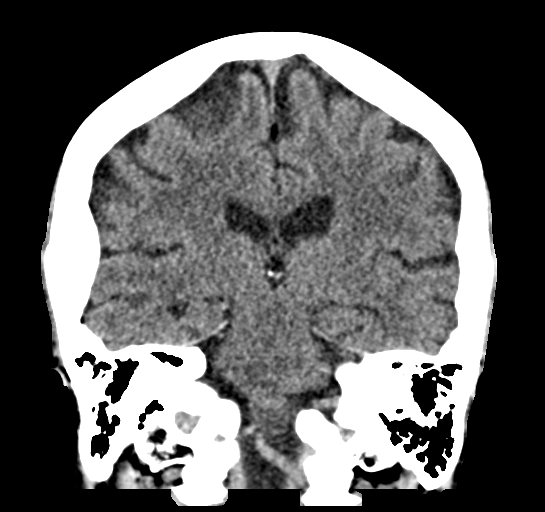

[Series 5: sag soft · sagittal · 0.33mm/px · 3 of 67 slices shown]
[im 23/67  brain]
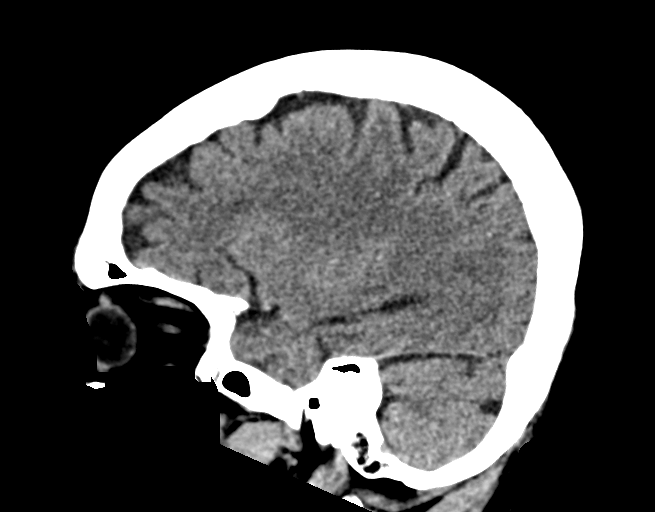
[im 34/67  brain]
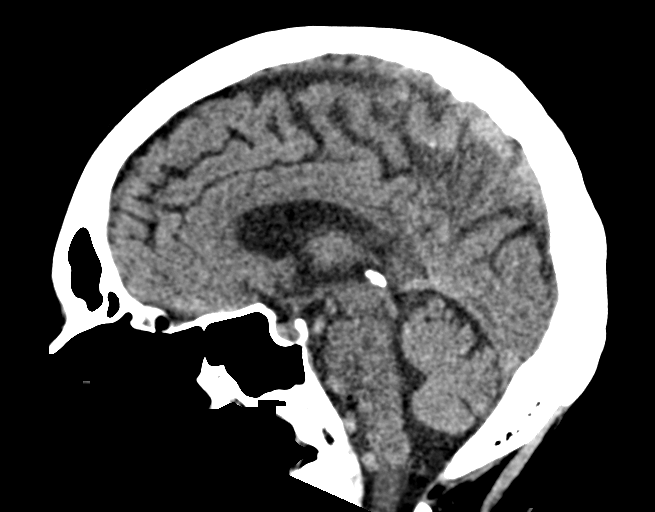
[im 45/67  brain]
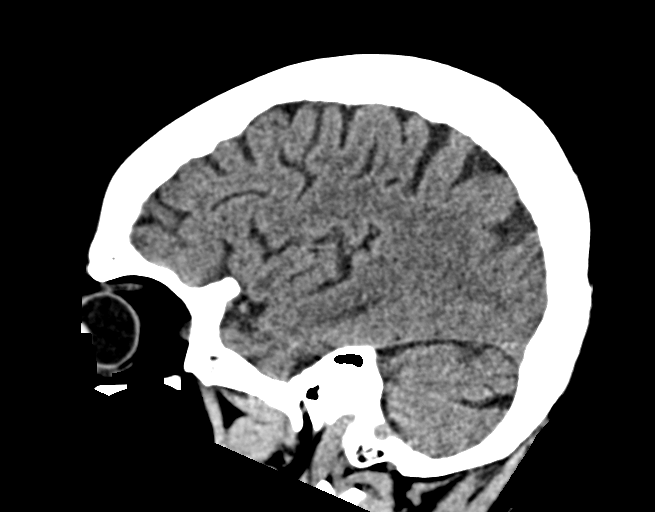

[16 of 47 positions shown; findings below may reference images not displayed]

FINDINGS: Brain: No evidence of acute large vascular territory infarction,
hemorrhage, hydrocephalus, extra-axial collection or mass
lesion/mass effect. Stable age related global parenchymal volume
loss and microvascular white matter ischemic disease.

Vascular: No hyperdense vessel. Atherosclerotic calcifications in
the skull base.

Skull: Normal. Negative for fracture or focal lesion.

Sinuses/Orbits: Paranasal sinuses and mastoid air cells are
predominantly clear. Orbits are grossly unremarkable.

Other: None
IMPRESSION: 1. No acute intracranial abnormality.
2. Stable age related global parenchymal volume loss and
microvascular white matter ischemic disease.

## 2021-05-29 DIAGNOSIS — Z780 Asymptomatic menopausal state: Secondary | ICD-10-CM | POA: Diagnosis not present

## 2021-05-29 DIAGNOSIS — M810 Age-related osteoporosis without current pathological fracture: Secondary | ICD-10-CM | POA: Diagnosis not present

## 2021-05-29 DIAGNOSIS — M85852 Other specified disorders of bone density and structure, left thigh: Secondary | ICD-10-CM | POA: Diagnosis not present

## 2021-07-31 ENCOUNTER — Encounter (HOSPITAL_BASED_OUTPATIENT_CLINIC_OR_DEPARTMENT_OTHER): Payer: Self-pay | Admitting: *Deleted

## 2021-07-31 ENCOUNTER — Other Ambulatory Visit: Payer: Self-pay

## 2021-07-31 ENCOUNTER — Emergency Department (HOSPITAL_BASED_OUTPATIENT_CLINIC_OR_DEPARTMENT_OTHER)
Admission: EM | Admit: 2021-07-31 | Discharge: 2021-08-01 | Disposition: A | Discharge status: Home / Self Care | Payer: Medicare Other | Attending: Emergency Medicine | Admitting: Emergency Medicine

## 2021-07-31 DIAGNOSIS — Z20822 Contact with and (suspected) exposure to covid-19: Secondary | ICD-10-CM | POA: Diagnosis not present

## 2021-07-31 DIAGNOSIS — N390 Urinary tract infection, site not specified: Secondary | ICD-10-CM | POA: Insufficient documentation

## 2021-07-31 DIAGNOSIS — N183 Chronic kidney disease, stage 3 unspecified: Secondary | ICD-10-CM | POA: Diagnosis not present

## 2021-07-31 DIAGNOSIS — F0390 Unspecified dementia without behavioral disturbance: Secondary | ICD-10-CM | POA: Insufficient documentation

## 2021-07-31 DIAGNOSIS — Z87891 Personal history of nicotine dependence: Secondary | ICD-10-CM | POA: Insufficient documentation

## 2021-07-31 DIAGNOSIS — I129 Hypertensive chronic kidney disease with stage 1 through stage 4 chronic kidney disease, or unspecified chronic kidney disease: Secondary | ICD-10-CM | POA: Insufficient documentation

## 2021-07-31 DIAGNOSIS — I10 Essential (primary) hypertension: Secondary | ICD-10-CM | POA: Diagnosis not present

## 2021-07-31 DIAGNOSIS — R5381 Other malaise: Secondary | ICD-10-CM | POA: Diagnosis not present

## 2021-07-31 DIAGNOSIS — Z9889 Other specified postprocedural states: Secondary | ICD-10-CM | POA: Diagnosis not present

## 2021-07-31 DIAGNOSIS — M549 Dorsalgia, unspecified: Secondary | ICD-10-CM | POA: Diagnosis not present

## 2021-07-31 LAB — COMPREHENSIVE METABOLIC PANEL
ALT: 17 U/L (ref 0–44)
AST: 22 U/L (ref 15–41)
Albumin: 4 g/dL (ref 3.5–5.0)
Alkaline Phosphatase: 72 U/L (ref 38–126)
Anion gap: 8 (ref 5–15)
BUN: 27 mg/dL — ABNORMAL HIGH (ref 8–23)
CO2: 25 mmol/L (ref 22–32)
Calcium: 9 mg/dL (ref 8.9–10.3)
Chloride: 107 mmol/L (ref 98–111)
Creatinine, Ser: 1.07 mg/dL — ABNORMAL HIGH (ref 0.44–1.00)
GFR, Estimated: 52 mL/min — ABNORMAL LOW (ref >60)
Glucose, Bld: 93 mg/dL (ref 70–99)
Potassium: 4.3 mmol/L (ref 3.5–5.1)
Sodium: 140 mmol/L (ref 135–145)
Total Bilirubin: 0.6 mg/dL (ref 0.3–1.2)
Total Protein: 7 g/dL (ref 6.5–8.1)

## 2021-07-31 LAB — CBC WITH DIFFERENTIAL/PLATELET
Abs Immature Granulocytes: 0.04 10*3/uL (ref 0.00–0.07)
Basophils Absolute: 0 10*3/uL (ref 0.0–0.1)
Basophils Relative: 0 %
Eosinophils Absolute: 0.1 10*3/uL (ref 0.0–0.5)
Eosinophils Relative: 1 %
HCT: 39.2 % (ref 36.0–46.0)
Hemoglobin: 13.4 g/dL (ref 12.0–15.0)
Immature Granulocytes: 0 %
Lymphocytes Relative: 26 %
Lymphs Abs: 2.4 10*3/uL (ref 0.7–4.0)
MCH: 31.3 pg (ref 26.0–34.0)
MCHC: 34.2 g/dL (ref 30.0–36.0)
MCV: 91.6 fL (ref 80.0–100.0)
Monocytes Absolute: 0.6 10*3/uL (ref 0.1–1.0)
Monocytes Relative: 6 %
Neutro Abs: 5.9 10*3/uL (ref 1.7–7.7)
Neutrophils Relative %: 67 %
Platelets: 230 10*3/uL (ref 150–400)
RBC: 4.28 MIL/uL (ref 3.87–5.11)
RDW: 12.2 % (ref 11.5–15.5)
WBC: 9.1 10*3/uL (ref 4.0–10.5)
nRBC: 0 % (ref 0.0–0.2)

## 2021-07-31 NOTE — ED Triage Notes (Addendum)
C/o mid back pain x 1 day , pt has dementia , daughter reports no falls and hx of back pain , when leaving triage room pt stood and became dizzy and reported weak. Pt placed in w/c  and labs ordered, family with pt

## 2021-08-01 ENCOUNTER — Emergency Department (HOSPITAL_BASED_OUTPATIENT_CLINIC_OR_DEPARTMENT_OTHER): Payer: Medicare Other

## 2021-08-01 DIAGNOSIS — R5381 Other malaise: Secondary | ICD-10-CM | POA: Diagnosis not present

## 2021-08-01 DIAGNOSIS — M549 Dorsalgia, unspecified: Secondary | ICD-10-CM | POA: Diagnosis not present

## 2021-08-01 DIAGNOSIS — Z9889 Other specified postprocedural states: Secondary | ICD-10-CM | POA: Diagnosis not present

## 2021-08-01 LAB — URINALYSIS, ROUTINE W REFLEX MICROSCOPIC
Bilirubin Urine: NEGATIVE
Glucose, UA: NEGATIVE mg/dL
Ketones, ur: NEGATIVE mg/dL
Nitrite: NEGATIVE
Protein, ur: 30 mg/dL — AB
Specific Gravity, Urine: 1.015 (ref 1.005–1.030)
pH: 6 (ref 5.0–8.0)

## 2021-08-01 LAB — URINALYSIS, MICROSCOPIC (REFLEX)

## 2021-08-01 LAB — CK: Total CK: 78 U/L (ref 38–234)

## 2021-08-01 LAB — RESP PANEL BY RT-PCR (FLU A&B, COVID) ARPGX2
Influenza A by PCR: NEGATIVE
Influenza B by PCR: NEGATIVE
SARS Coronavirus 2 by RT PCR: NEGATIVE

## 2021-08-01 MED ORDER — CEPHALEXIN 500 MG PO CAPS: 500.0000 mg | ORAL_CAPSULE | Freq: Three times a day (TID) | ORAL | 0 refills | Status: AC

## 2021-08-01 MED ORDER — CEPHALEXIN 250 MG PO CAPS
500.0000 mg | ORAL_CAPSULE | Freq: Once | ORAL | Status: AC
  Administered 2021-08-01: 500 mg via ORAL
  Filled 2021-08-01: qty 2

## 2021-08-01 NOTE — ED Provider Notes (Signed)
[facility] Emergency Department Provider Note MRN:  [id number]  Arrival date & time: 08/01/21     Chief Complaint   Malaise History of Present Illness   Christina Perkins is a 80 y.o. year-old female with a history of CKD, hypertension, presenting to the ED with chief complaint of malaise.  Patient just does not feel good today.  Endorsing low back pain and left hip pain but daughter explains that these are very chronic issues and they are unchanged.  Patient denies any chest pain or shortness of breath, no abdominal pain, no burning with urination, no headache or vision change.  Generally does not feel well.  Symptoms mild to moderate, constant, no exacerbating or alleviating factors.  Review of Systems  A complete 10 system review of systems was obtained and all systems are negative except as noted in the HPI and PMH.   Patient's Health History    Past Medical History:  Diagnosis Date   Abnormal chest x-ray    Abnormal results of liver function studies    Abnormal thyroid screen (blood)    Acute sinusitis    Alcohol screening    Anemia, deficiency    Arthropathy    BMI 22.0-22.9, adult    Carotid stenosis, bilateral    Carotid stenosis, bilateral    Cellulitis    Chronic kidney disease, stage III (moderate) (HCC)    Closed fracture of sacrum and coccyx, with routine healing, subsequent encounter    Colon cancer screening    Colon polyp    Dementia (HCC)    Dizzy spells    Encounter for screening mammogram for breast cancer    Facet arthropathy, lumbar    Fatty pancreas    Generalized abdominal or pelvic swelling or mass or lump    Glaucoma screening    H/O mammogram 2019   Per [facility] new patient packet   Hepatomegalia    High risk medication use    Hospital discharge follow-up    Hx of colonoscopy 2016   Per [facility] new patient packet   Hypertension    Hypertriglyceridemia    Influenza vaccination declined by patient    Low back pain     Lumbar disc disease with radiculopathy    Lung nodule    Magnesium deficiency    Medical non-compliance    Menopause    Microscopic hematuria    Mixed dyslipidemia    Neurodegenerative cognitive impairment (HCC)    Osteoarthritis, hip, bilateral    Overweight    Personal history of smoking    Plantar fasciitis    Poor appetite    Refusal of influenza vaccine by provider    Renal cyst, left    Right atrial enlargement    Right hip pain    Sciatica of right side    Screening for depression    Screening for HIV (human immunodeficiency virus)    Screening for STDs (sexually transmitted diseases)    Senile dementia without behavioral disturbance (HCC)    Shortness of breath    Spondylolisthesis, grade 1    Unintentional weight loss    URI, acute    Urinary frequency    UTI (urinary tract infection), bacterial    Vitamin D deficiency     History reviewed. No pertinent surgical history.  Family History  Problem Relation Age of Onset   Dementia Mother    Alzheimer's disease Mother    Hypertension Mother    Dementia Father    Blindness Father  Hypertension Father    Stroke Father     Social History   Socioeconomic History   Marital status: Divorced    Spouse name: Not on file   Number of children: Not on file   Years of education: Not on file   Highest education level: Not on file  Occupational History   Not on file  Tobacco Use   Smoking status: Former   Smokeless tobacco: Never  Substance and Sexual Activity   Alcohol use: Not Currently    Comment: 6 glasses of wine a week.   Drug use: No   Sexual activity: Not on file  Other Topics Concern   Not on file  Social History Narrative   Diet Unanswered      Do you drink/eat things with caffeine Unanswered      Marital Status: Divorced What year were you married? Unanswered      Do you live in a house, apartment, assisted living, condo, trailer, etc.? House      Is it one or more stories? One      How many  persons live in your home? 1         Do you have any pets in your home?(please list) No      Highest level of education completed: Some college      Current or past profession: Unanswered      Do you exercise?:No    Type and how often:      Do you have a Living Will? Yes, in process      Do you have a DNR form?          If not, would you like to discuss one? unanswered on new patient packet      Do you have signed POA/HPOA forms? Yes      Do you have difficulty bathing or dressing yourself? No      Do you have difficulty preparing food or eating? No      Do you have difficulty managing medications? Yes      Do you have difficulty managing your finances? Yes      Do you have difficulty affording your medications? No                  Social Determinants of Corporate investment banker Strain: Not on file  Food Insecurity: Not on file  Transportation Needs: Not on file  Physical Activity: Not on file  Stress: Not on file  Social Connections: Not on file  Intimate Partner Violence: Not on file     Physical Exam   Vitals:   08/01/21 0030 08/01/21 0100  BP: (!) 171/81 (!) 172/74  Pulse: 65 75  Resp: 20 18  SpO2: 100% 100%    CONSTITUTIONAL: Well-appearing, NAD NEURO:  Alert and oriented x 3, normal and symmetric strength and sensation, normal coordination, normal speech EYES:  eyes equal and reactive ENT/NECK:  no LAD, no JVD CARDIO: Regular rate, well-perfused, normal S1 and S2 PULM:  CTAB no wheezing or rhonchi GI/GU:  normal bowel sounds, non-distended, non-tender MSK/SPINE:  No gross deformities, no edema SKIN:  no rash, atraumatic PSYCH:  Appropriate speech and behavior  *Additional and/or pertinent findings included in MDM below  Diagnostic and Interventional Summary    EKG Interpretation  Date/Time:  Wednesday July 31 2021 21:14:20 EDT Ventricular Rate:  79 PR Interval:  156 QRS Duration: 68 QT Interval:  380 QTC Calculation: 435 R  Axis:   76  Text Interpretation: Normal sinus rhythm Normal ECG Confirmed by Kennis Carina [phone]) on 07/31/2021 11:57:21 PM       Labs Reviewed  URINALYSIS, ROUTINE W REFLEX MICROSCOPIC - Abnormal; Notable for the following components:      Result Value   APPearance HAZY (*)    Hgb urine dipstick MODERATE (*)    Protein, ur 30 (*)    Leukocytes,Ua MODERATE (*)    All other components within normal limits  COMPREHENSIVE METABOLIC PANEL - Abnormal; Notable for the following components:   BUN 27 (*)    Creatinine, Ser 1.07 (*)    GFR, Estimated 52 (*)    All other components within normal limits  URINALYSIS, MICROSCOPIC (REFLEX) - Abnormal; Notable for the following components:   Bacteria, UA MANY (*)    All other components within normal limits  RESP PANEL BY RT-PCR (FLU A&B, COVID) ARPGX2  CBC WITH DIFFERENTIAL/PLATELET  CK    DG Chest Port 1 View  Final Result      Medications - No data to display   Procedures  /  Critical Care Procedures  ED Course and Medical Decision Making  I have reviewed the triage vital signs, the nursing notes, and pertinent available records from the EMR.  Listed above are laboratory and imaging tests that I personally ordered, reviewed, and interpreted and then considered in my medical decision making (see below for details).  Vague symptoms of malaise, considering metabolic disarray, UTI, occult pneumonia.  Will obtain screening evaluation here in the emergency department but with normal vital signs and negative work-up there would be no indication for admission or further testing and patient will be appropriate for discharge with PCP follow-up.     Work-up overall reassuring, urinalysis is with blood, leuk esterase positive, many bacteria.  Given this and the malaise and the back pain, could be acute urinary tract infection.  Will cover with Keflex.  Patient with no fever, normal vital signs, appropriate for discharge  Elmer Sow. Pilar Plate, MD [facility]  Health Emergency Medicine [facility] [email or web address]  Final Clinical Impressions(s) / ED Diagnoses     ICD-10-CM   1. Malaise  R53.81     2. Lower urinary tract infectious disease  N39.0       ED Discharge Orders          Ordered    cephALEXin (KEFLEX) 500 MG capsule  3 times daily        08/01/21 0228             Discharge Instructions Discussed with and Provided to Patient:     Discharge Instructions      You were evaluated in the Emergency Department and after careful evaluation, we did not find any emergent condition requiring admission or further testing in the hospital.  Your exam/testing today was overall reassuring.  Symptoms may be due to a urinary tract infection.  Please take the Keflex antibiotic as directed and follow-up with your regular doctor.  Please return to the Emergency Department if you experience any worsening of your condition.  Thank you for allowing [place] to be a part of your care.         Sabas Sous, MD 08/01/21 [phone]

## 2021-08-01 NOTE — Discharge Instructions (Addendum)
You were evaluated in the Emergency Department and after careful evaluation, we did not find any emergent condition requiring admission or further testing in the hospital.  Your exam/testing today was overall reassuring.  Symptoms may be due to a urinary tract infection.  Please take the Keflex antibiotic as directed and follow-up with your regular doctor.  Please return to the Emergency Department if you experience any worsening of your condition.  Thank you for allowing [place] to be a part of your care.

## 2021-08-22 ENCOUNTER — Other Ambulatory Visit: Payer: Self-pay | Admitting: Family

## 2021-08-22 DIAGNOSIS — E785 Hyperlipidemia, unspecified: Secondary | ICD-10-CM

## 2021-08-27 ENCOUNTER — Ambulatory Visit (INDEPENDENT_AMBULATORY_CARE_PROVIDER_SITE_OTHER): Payer: Medicare Other | Admitting: Family

## 2021-08-27 ENCOUNTER — Telehealth: Payer: Self-pay

## 2021-08-27 ENCOUNTER — Other Ambulatory Visit: Payer: Self-pay

## 2021-08-27 ENCOUNTER — Encounter: Payer: Self-pay | Admitting: Family

## 2021-08-27 DIAGNOSIS — Z0000 Encounter for general adult medical examination without abnormal findings: Secondary | ICD-10-CM | POA: Diagnosis not present

## 2021-08-27 DIAGNOSIS — Z23 Encounter for immunization: Secondary | ICD-10-CM | POA: Diagnosis not present

## 2021-08-27 MED ORDER — TETANUS-DIPHTH-ACELL PERTUSSIS 5-2.5-18.5 LF-MCG/0.5 IM SUSP
0.5000 mL | Freq: Once | INTRAMUSCULAR | 0 refills | Status: AC
Start: 2021-08-27 — End: 2021-08-27

## 2021-08-27 NOTE — Patient Instructions (Signed)
Christina Perkins , Thank you for taking time to come for your Medicare Wellness Visit. I appreciate your ongoing commitment to your health goals. Please review the following plan we discussed and let me know if I can assist you in the future.   Screening recommendations/referrals: Colonoscopy: Age out  Mammogram : Age out  Bone Density : ordered 05/06/2021  Recommended yearly ophthalmology/optometry visit for glaucoma screening and checkup Recommended yearly dental visit for hygiene and checkup  Vaccinations: Influenza vaccine: Due  Pneumococcal vaccine : Up to date  Tdap vaccine : Due  Shingles vaccine : Due     Advanced directives: Yes   Conditions/risks identified: Advance Age female > 51 yrs,sedentary lifestyle and hx of smoking   Next appointment: 1 year    Preventive Care 6 Years and Older, Female Preventive care refers to lifestyle choices and visits with your health care provider that can promote health and wellness. What does preventive care include? A yearly physical exam. This is also called an annual well check. Dental exams once or twice a year. Routine eye exams. Ask your health care provider how often you should have your eyes checked. Personal lifestyle choices, including: Daily care of your teeth and gums. Regular physical activity. Eating a healthy diet. Avoiding tobacco and drug use. Limiting alcohol use. Practicing safe sex. Taking low-dose aspirin every day. Taking vitamin and mineral supplements as recommended by your health care provider. What happens during an annual well check? The services and screenings done by your health care provider during your annual well check will depend on your age, overall health, lifestyle risk factors, and family history of disease. Counseling  Your health care provider may ask you questions about your: Alcohol use. Tobacco use. Drug use. Emotional well-being. Home and relationship well-being. Sexual activity. Eating  habits. History of falls. Memory and ability to understand (cognition). Work and work Astronomer. Reproductive health. Screening  You may have the following tests or measurements: Height, weight, and BMI. Blood pressure. Lipid and cholesterol levels. These may be checked every 5 years, or more frequently if you are over 9 years old. Skin check. Lung cancer screening. You may have this screening every year starting at age 10 if you have a 30-pack-year history of smoking and currently smoke or have quit within the past 15 years. Fecal occult blood test (FOBT) of the stool. You may have this test every year starting at age 69. Flexible sigmoidoscopy or colonoscopy. You may have a sigmoidoscopy every 5 years or a colonoscopy every 10 years starting at age 89. Hepatitis C blood test. Hepatitis B blood test. Sexually transmitted disease (STD) testing. Diabetes screening. This is done by checking your blood sugar (glucose) after you have not eaten for a while (fasting). You may have this done every 1-3 years. Bone density scan. This is done to screen for osteoporosis. You may have this done starting at age 58. Mammogram. This may be done every 1-2 years. Talk to your health care provider about how often you should have regular mammograms. Talk with your health care provider about your test results, treatment options, and if necessary, the need for more tests. Vaccines  Your health care provider may recommend certain vaccines, such as: Influenza vaccine. This is recommended every year. Tetanus, diphtheria, and acellular pertussis (Tdap, Td) vaccine. You may need a Td booster every 10 years. Zoster vaccine. You may need this after age 58. Pneumococcal 13-valent conjugate (PCV13) vaccine. One dose is recommended after age 82. Pneumococcal polysaccharide (PPSV23)  vaccine. One dose is recommended after age 95. Talk to your health care provider about which screenings and vaccines you need and how  often you need them. This information is not intended to replace advice given to you by your health care provider. Make sure you discuss any questions you have with your health care provider. Document Released: 11/30/2015 Document Revised: 07/23/2016 Document Reviewed: 09/04/2015 Elsevier Interactive Patient Education  2017 [employer] Prevention in the Home Falls can cause injuries. They can happen to people of all ages. There are many things you can do to make your home safe and to help prevent falls. What can I do on the outside of my home? Regularly fix the edges of walkways and driveways and fix any cracks. Remove anything that might make you trip as you walk through a door, such as a raised step or threshold. Trim any bushes or trees on the path to your home. Use bright outdoor lighting. Clear any walking paths of anything that might make someone trip, such as rocks or tools. Regularly check to see if handrails are loose or broken. Make sure that both sides of any steps have handrails. Any raised decks and porches should have guardrails on the edges. Have any leaves, snow, or ice cleared regularly. Use sand or salt on walking paths during winter. Clean up any spills in your garage right away. This includes oil or grease spills. What can I do in the bathroom? Use night lights. Install grab bars by the toilet and in the tub and shower. Do not use towel bars as grab bars. Use non-skid mats or decals in the tub or shower. If you need to sit down in the shower, use a plastic, non-slip stool. Keep the floor dry. Clean up any water that spills on the floor as soon as it happens. Remove soap buildup in the tub or shower regularly. Attach bath mats securely with double-sided non-slip rug tape. Do not have throw rugs and other things on the floor that can make you trip. What can I do in the bedroom? Use night lights. Make sure that you have a light by your bed that is easy to  reach. Do not use any sheets or blankets that are too big for your bed. They should not hang down onto the floor. Have a firm chair that has side arms. You can use this for support while you get dressed. Do not have throw rugs and other things on the floor that can make you trip. What can I do in the kitchen? Clean up any spills right away. Avoid walking on wet floors. Keep items that you use a lot in easy-to-reach places. If you need to reach something above you, use a strong step stool that has a grab bar. Keep electrical cords out of the way. Do not use floor polish or wax that makes floors slippery. If you must use wax, use non-skid floor wax. Do not have throw rugs and other things on the floor that can make you trip. What can I do with my stairs? Do not leave any items on the stairs. Make sure that there are handrails on both sides of the stairs and use them. Fix handrails that are broken or loose. Make sure that handrails are as long as the stairways. Check any carpeting to make sure that it is firmly attached to the stairs. Fix any carpet that is loose or worn. Avoid having throw rugs at the top or bottom  of the stairs. If you do have throw rugs, attach them to the floor with carpet tape. Make sure that you have a light switch at the top of the stairs and the bottom of the stairs. If you do not have them, ask someone to add them for you. What else can I do to help prevent falls? Wear shoes that: Do not have high heels. Have rubber bottoms. Are comfortable and fit you well. Are closed at the toe. Do not wear sandals. If you use a stepladder: Make sure that it is fully opened. Do not climb a closed stepladder. Make sure that both sides of the stepladder are locked into place. Ask someone to hold it for you, if possible. Clearly mark and make sure that you can see: Any grab bars or handrails. First and last steps. Where the edge of each step is. Use tools that help you move  around (mobility aids) if they are needed. These include: Canes. Walkers. Scooters. Crutches. Turn on the lights when you go into a dark area. Replace any light bulbs as soon as they burn out. Set up your furniture so you have a clear path. Avoid moving your furniture around. If any of your floors are uneven, fix them. If there are any pets around you, be aware of where they are. Review your medicines with your doctor. Some medicines can make you feel dizzy. This can increase your chance of falling. Ask your doctor what other things that you can do to help prevent falls. This information is not intended to replace advice given to you by your health care provider. Make sure you discuss any questions you have with your health care provider. Document Released: 08/30/2009 Document Revised: 04/10/2016 Document Reviewed: 12/08/2014 Elsevier Interactive Patient Education  2017 [employer].

## 2021-08-27 NOTE — Telephone Encounter (Signed)
Ms. Christina Perkins, Christina Perkins are scheduled for a virtual visit with your provider today.    Just as we do with appointments in the office, we must obtain your consent to participate.  Your consent will be active for this visit and any virtual visit you may have with one of our providers in the next 365 days.    If you have a MyChart account, I can also send a copy of this consent to you electronically.  All virtual visits are billed to your insurance company just like a traditional visit in the office.  As this is a virtual visit, video technology does not allow for your provider to perform a traditional examination.  This may limit your provider's ability to fully assess your condition.  If your provider identifies any concerns that need to be evaluated in person or the need to arrange testing such as labs, EKG, etc, we will make arrangements to do so.    Although advances in technology are sophisticated, we cannot ensure that it will always work on either your end or our end.  If the connection with a video visit is poor, we may have to switch to a telephone visit.  With either a video or telephone visit, we are not always able to ensure that we have a secure connection.   I need to obtain your verbal consent now.   Are you willing to proceed with your visit today?   Adrian Prince and daughter [name] has provided verbal consent on 08/27/2021 for a virtual visit (video or telephone).   [name] [place], [place] 08/27/2021  4:03 PM

## 2021-08-27 NOTE — Progress Notes (Signed)
This service is provided via telemedicine  No vital signs collected/recorded due to the encounter was a telemedicine visit.   Location of patient (ex: home, work):  Home  Patient consents to a telephone visit: Yes, see telephone visit dated 08/27/21  Location of the provider (ex: office, home):  [facility], Office   Name of any referring provider:  N/A  Names of all persons participating in the telemedicine service and their role in the encounter:  S.Chrae B/CMA, Richarda Blade, NP, [name] (daughter), and Patient   Time spent on call:  9 min with medical assistant      Subjective:   Christina Perkins is a 80 y.o. female who presents for Medicare Annual (Subsequent) preventive examination.  Review of Systems     Cardiac Risk Factors include: advanced age (>38men, >4 women);sedentary lifestyle;smoking/ tobacco exposure     Objective:    Today's Vitals   08/27/21 1603  PainSc: 9    There is no height or weight on file to calculate BMI.  Advanced Directives 08/27/2021 05/06/2021 02/11/2021 01/15/2021 11/02/2020 10/17/2020 07/27/2020  Does Patient Have a Medical Advance Directive? Yes Yes No No Yes Yes Yes  Type of Advance Directive Living will Living will - - Living will Living will Living will;Healthcare Power of Attorney  Does patient want to make changes to medical advance directive? No - Patient declined No - Patient declined - - No - Patient declined No - Patient declined No - Patient declined  Copy of Healthcare Power of Attorney in Chart? - - - - - - Yes - validated most recent copy scanned in chart (See row information)  Would patient like information on creating a medical advance directive? - - No - Patient declined No - Patient declined - No - Patient declined -    Current Medications (verified) Outpatient Encounter Medications as of 08/27/2021  Medication Sig   acetaminophen (TYLENOL) 500 MG tablet Take 2 tablets (1,000 mg total) by mouth in  the morning and at bedtime.   atorvastatin (LIPITOR) 10 MG tablet TAKE 1 TABLET BY MOUTH AT  BEDTIME   cholecalciferol (VITAMIN D3) 25 MCG (1000 UNIT) tablet Take 1,000 Units by mouth daily.   donepezil (ARICEPT) 10 MG tablet Take 10 mg by mouth at bedtime. Before Bedtime.   Ensure (ENSURE) Take 237 mLs by mouth daily.   memantine (NAMENDA) 10 MG tablet Take 10 mg by mouth 2 (two) times daily.   mirtazapine (REMERON) 7.5 MG tablet Take 1 tablet (7.5 mg total) by mouth at bedtime.   [DISCONTINUED] Camphor-Menthol-Methyl Sal (SALONPAS) 3.11-22-08 % PTCH Apply 1 patch topically every morning. (Patient not taking: Reported on 08/27/2021)   No facility-administered encounter medications on file as of 08/27/2021.    Allergies (verified) Patient has no known allergies.   History: Past Medical History:  Diagnosis Date   Abnormal chest x-ray    Abnormal results of liver function studies    Abnormal thyroid screen (blood)    Acute sinusitis    Alcohol screening    Anemia, deficiency    Arthropathy    BMI 22.0-22.9, adult    Carotid stenosis, bilateral    Carotid stenosis, bilateral    Cellulitis    Chronic kidney disease, stage III (moderate) (HCC)    Closed fracture of sacrum and coccyx, with routine healing, subsequent encounter    Colon cancer screening    Colon polyp    Dementia (HCC)    Dizzy spells  Encounter for screening mammogram for breast cancer    Facet arthropathy, lumbar    Fatty pancreas    Generalized abdominal or pelvic swelling or mass or lump    Glaucoma screening    H/O mammogram 2019   Per [facility] new patient packet   Hepatomegalia    High risk medication use    Hospital discharge follow-up    Hx of colonoscopy 2016   Per [facility] new patient packet   Hypertension    Hypertriglyceridemia    Influenza vaccination declined by patient    Low back pain    Lumbar disc disease with radiculopathy    Lung nodule    Magnesium deficiency    Medical non-compliance     Menopause    Microscopic hematuria    Mixed dyslipidemia    Neurodegenerative cognitive impairment (HCC)    Osteoarthritis, hip, bilateral    Overweight    Personal history of smoking    Plantar fasciitis    Poor appetite    Refusal of influenza vaccine by provider    Renal cyst, left    Right atrial enlargement    Right hip pain    Sciatica of right side    Screening for depression    Screening for HIV (human immunodeficiency virus)    Screening for STDs (sexually transmitted diseases)    Senile dementia without behavioral disturbance (HCC)    Shortness of breath    Spondylolisthesis, grade 1    Unintentional weight loss    URI, acute    Urinary frequency    UTI (urinary tract infection), bacterial    Vitamin D deficiency    History reviewed. No pertinent surgical history. Family History  Problem Relation Age of Onset   Dementia Mother    Alzheimer's disease Mother    Hypertension Mother    Dementia Father    Blindness Father    Hypertension Father    Stroke Father    Social History   Socioeconomic History   Marital status: Divorced    Spouse name: Not on file   Number of children: Not on file   Years of education: Not on file   Highest education level: Not on file  Occupational History   Not on file  Tobacco Use   Smoking status: Former   Smokeless tobacco: Never   Tobacco comments:    Quit at about age 48   Vaping Use   Vaping Use: Never used  Substance and Sexual Activity   Alcohol use: Not Currently    Comment: 6 glasses of wine a week.   Drug use: No   Sexual activity: Not on file  Other Topics Concern   Not on file  Social History Narrative   Diet Unanswered      Do you drink/eat things with caffeine Unanswered      Marital Status: Divorced What year were you married? Unanswered      Do you live in a house, apartment, assisted living, condo, trailer, etc.? House      Is it one or more stories? One      How many persons live in your home?  1         Do you have any pets in your home?(please list) No      Highest level of education completed: Some college      Current or past profession: Unanswered      Do you exercise?:No    Type and how often:  Do you have a Living Will? Yes, in process      Do you have a DNR form?          If not, would you like to discuss one? unanswered on new patient packet      Do you have signed POA/HPOA forms? Yes      Do you have difficulty bathing or dressing yourself? No      Do you have difficulty preparing food or eating? No      Do you have difficulty managing medications? Yes      Do you have difficulty managing your finances? Yes      Do you have difficulty affording your medications? No                  Social Determinants of Corporate investment banker Strain: Not on file  Food Insecurity: Not on file  Transportation Needs: Not on file  Physical Activity: Not on file  Stress: Not on file  Social Connections: Not on file    Tobacco Counseling Counseling given: Not Answered Tobacco comments: Quit at about age 5    Clinical Intake:  Pre-visit preparation completed: No  Pain : 0-10 Pain Score: 9  Pain Type: Chronic pain Pain Location: Back Pain Orientation: Lower Pain Radiating Towards: No Pain Descriptors / Indicators: Aching Pain Onset: Other (comment) (several years) Pain Frequency: Constant Pain Relieving Factors: heating pad Effect of Pain on Daily Activities: No  Pain Relieving Factors: heating pad  BMI - recorded: 22.24 Nutritional Status: BMI of 19-24  Normal Nutritional Risks: None Diabetes: No  How often do you need to have someone help you when you read instructions, pamphlets, or other written materials from your doctor or pharmacy?: 5 - Always (daughter asisst) What is the last grade level you completed in school?: 2 yrs of college  Diabetic? No   Interpreter Needed?: No  Information entered by :: Kamareon Sciandra,FNP-C   Activities of Daily Living In your present state of health, do you have any difficulty performing the following activities: 08/27/2021  Hearing? N  Vision? N  Difficulty concentrating or making decisions? Y  Comment Remembering  Walking or climbing stairs? N  Dressing or bathing? N  Doing errands, shopping? Y  Comment daughter  Quarry manager and eating ? Y  Comment daughter assist  Using the Toilet? Y  Comment constipation peptobismol effective  In the past six months, have you accidently leaked urine? Y  Comment incontinent  Do you have problems with loss of bowel control? N  Managing your Medications? Y  Comment daughter assist  Managing your Finances? Y  Comment daughter assist  Housekeeping or managing your Housekeeping? Y  Comment daughter assist  Some recent data might be hidden    Patient Care Team: Arietta Eisenstein, Donalee Citrin, NP as PCP - General (Family Medicine)  Indicate any recent Medical Services you may have received from other than [facility] providers in the past year (date may be approximate).     Assessment:   This is a routine wellness examination for [facility].  Hearing/Vision screen Hearing Screening - Comments:: No hearing issues  Vision Screening - Comments:: Last eye exam greater than 12 months ago. Patients daughter will set patient up with her eye doctor.   Dietary issues and exercise activities discussed: Current Exercise Habits: The patient does not participate in regular exercise at present, Exercise limited by: None identified   Goals Addressed  This Visit's Progress    Patient Stated       Be able to doing ADL's by herself        Depression Screen PHQ 2/9 Scores 08/27/2021  PHQ - 2 Score 0    Fall Risk Fall Risk  08/27/2021 05/06/2021 11/02/2020 10/17/2020 07/27/2020  Falls in the past year? 0 0 0 0 1  Number falls in past yr: 0 0 0 0 0  Injury with Fall? 0 0 0 0 0  Risk for fall due to : No Fall Risks No Fall Risks -  - -  Follow up Falls evaluation completed - - - -    FALL RISK PREVENTION PERTAINING TO THE HOME:  Any stairs in or around the home? Yes  If so, are there any without handrails? No  Home free of loose throw rugs in walkways, pet beds, electrical cords, etc? No  Adequate lighting in your home to reduce risk of falls? Yes   ASSISTIVE DEVICES UTILIZED TO PREVENT FALLS:  Life alert? No  Use of a cane, walker or w/c? Yes  Grab bars in the bathroom? Yes  Shower chair or bench in shower? Yes  Elevated toilet seat or a handicapped toilet? Yes   TIMED UP AND GO:  Was the test performed? No .  Length of time to ambulate 10 feet: N/A  sec.   Gait slow and steady with assistive device  Cognitive Function: MMSE - Mini Mental State Exam 07/03/2020  Orientation to time 0  Orientation to Place 3  Registration 3  Attention/ Calculation 4  Recall 0  Language- name 2 objects 2  Language- repeat 1  Language- follow 3 step command 3  Language- read & follow direction 1  Write a sentence 1  Copy design 0  Total score 18     6CIT Screen 08/27/2021  What Year? 4 points  What month? 3 points  What time? 3 points  Count back from 20 4 points  Months in reverse 4 points  Repeat phrase 10 points  Total Score 28    Immunizations Immunization History  Administered Date(s) Administered   PFIZER(Purple Top)SARS-COV-2 Vaccination 12/29/2019, 01/23/2020   Pneumococcal Polysaccharide-23 05/06/2021    TDAP status: Due, Education has been provided regarding the importance of this vaccine. Advised may receive this vaccine at local pharmacy or Health Dept. Aware to provide a copy of the vaccination record if obtained from local pharmacy or Health Dept. Verbalized acceptance and understanding.  Flu Vaccine status: Due, Education has been provided regarding the importance of this vaccine. Advised may receive this vaccine at local pharmacy or Health Dept. Aware to provide a copy of the vaccination  record if obtained from local pharmacy or Health Dept. Verbalized acceptance and understanding.  Pneumococcal vaccine status: Up to date  Covid-19 vaccine status: Information provided on how to obtain vaccines.   Qualifies for Shingles Vaccine? No   Zostavax completed No   Shingrix Completed?: No.    Education has been provided regarding the importance of this vaccine. Patient has been advised to call insurance company to determine out of pocket expense if they have not yet received this vaccine. Advised may also receive vaccine at local pharmacy or Health Dept. Verbalized acceptance and understanding.  Screening Tests Health Maintenance  Topic Date Due   TETANUS/TDAP  Never done   Zoster Vaccines- Shingrix (1 of 2) Never done   DEXA SCAN  Never done   COVID-19 Vaccine (3 - Booster for [employer] series)  06/24/2020   INFLUENZA VACCINE  Never done   HPV VACCINES  Aged Out    Health Maintenance  Health Maintenance Due  Topic Date Due   TETANUS/TDAP  Never done   Zoster Vaccines- Shingrix (1 of 2) Never done   DEXA SCAN  Never done   COVID-19 Vaccine (3 - Booster for [employer] series) 06/24/2020   INFLUENZA VACCINE  Never done    Colorectal cancer screening: No longer required.   Mammogram status: No longer required due to due to age .  Bone Density status: Ordered 05/06/2021 . Pt provided with contact info and advised to call to schedule appt.  Lung Cancer Screening: (Low Dose CT Chest recommended if Age 41-80 years, 30 pack-year currently smoking OR have quit w/in 15years.) does not qualify.   Lung Cancer Screening Referral: No   Additional Screening:  Hepatitis C Screening: does not qualify; Completed No   Vision Screening: Recommended annual ophthalmology exams for early detection of glaucoma and other disorders of the eye. Is the patient up to date with their annual eye exam?  No  Who is the provider or what is the name of the office in which the patient attends annual eye  exams? Daughter with schedule with her eye doctor  If pt is not established with a provider, would they like to be referred to a provider to establish care? No .   Dental Screening: Recommended annual dental exams for proper oral hygiene  Community Resource Referral / Chronic Care Management: CRR required this visit?  No   CCM required this visit?  No      Plan:     I have personally reviewed and noted the following in the patient's chart:   Medical and social history Use of alcohol, tobacco or illicit drugs  Current medications and supplements including opioid prescriptions.  Functional ability and status Nutritional status Physical activity Advanced directives List of other physicians Hospitalizations, surgeries, and ER visits in previous 12 months Vitals Screenings to include cognitive, depression, and falls Referrals and appointments  In addition, I have reviewed and discussed with patient certain preventive protocols, quality metrics, and best practice recommendations. A written personalized care plan for preventive services as well as general preventive health recommendations were provided to patient.     Caesar Bookman, NP   08/27/2021   Nurse Notes: Due for influenza vaccine,Tdap,Shingrix and COVID-19 booster vaccine advised to get vaccine at her pharmacy. May come to the office for influenza vaccine if desired.

## 2021-09-19 ENCOUNTER — Other Ambulatory Visit: Payer: Self-pay | Admitting: Family

## 2021-09-19 DIAGNOSIS — R634 Abnormal weight loss: Secondary | ICD-10-CM

## 2021-11-02 IMAGING — DX DG CHEST 1V PORT
2 series · 2 of 2 positions shown · non-contrast
Comparison: 04/11/2021

CLINICAL DATA: Malaise, mid back pain, dementia

EXAM:
PORTABLE CHEST 1 VIEW

[chest ap (1 of 2)]
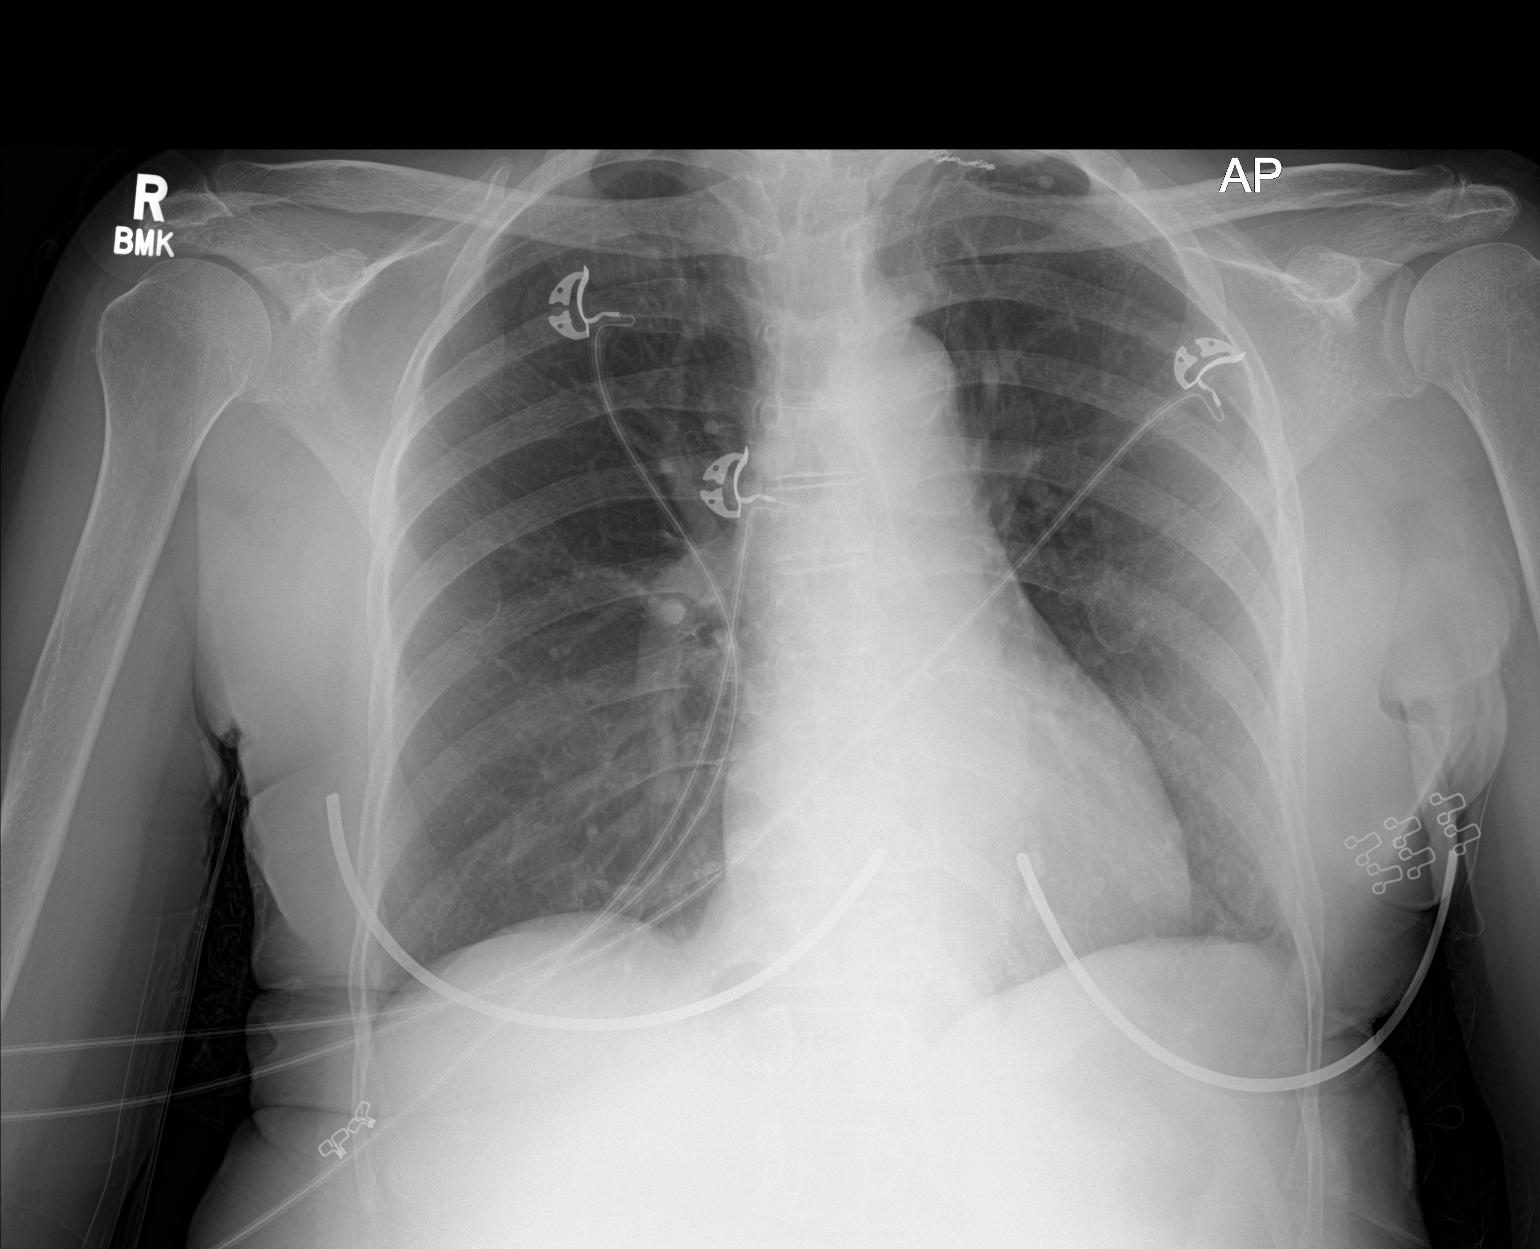

[chest ap (2 of 2)]
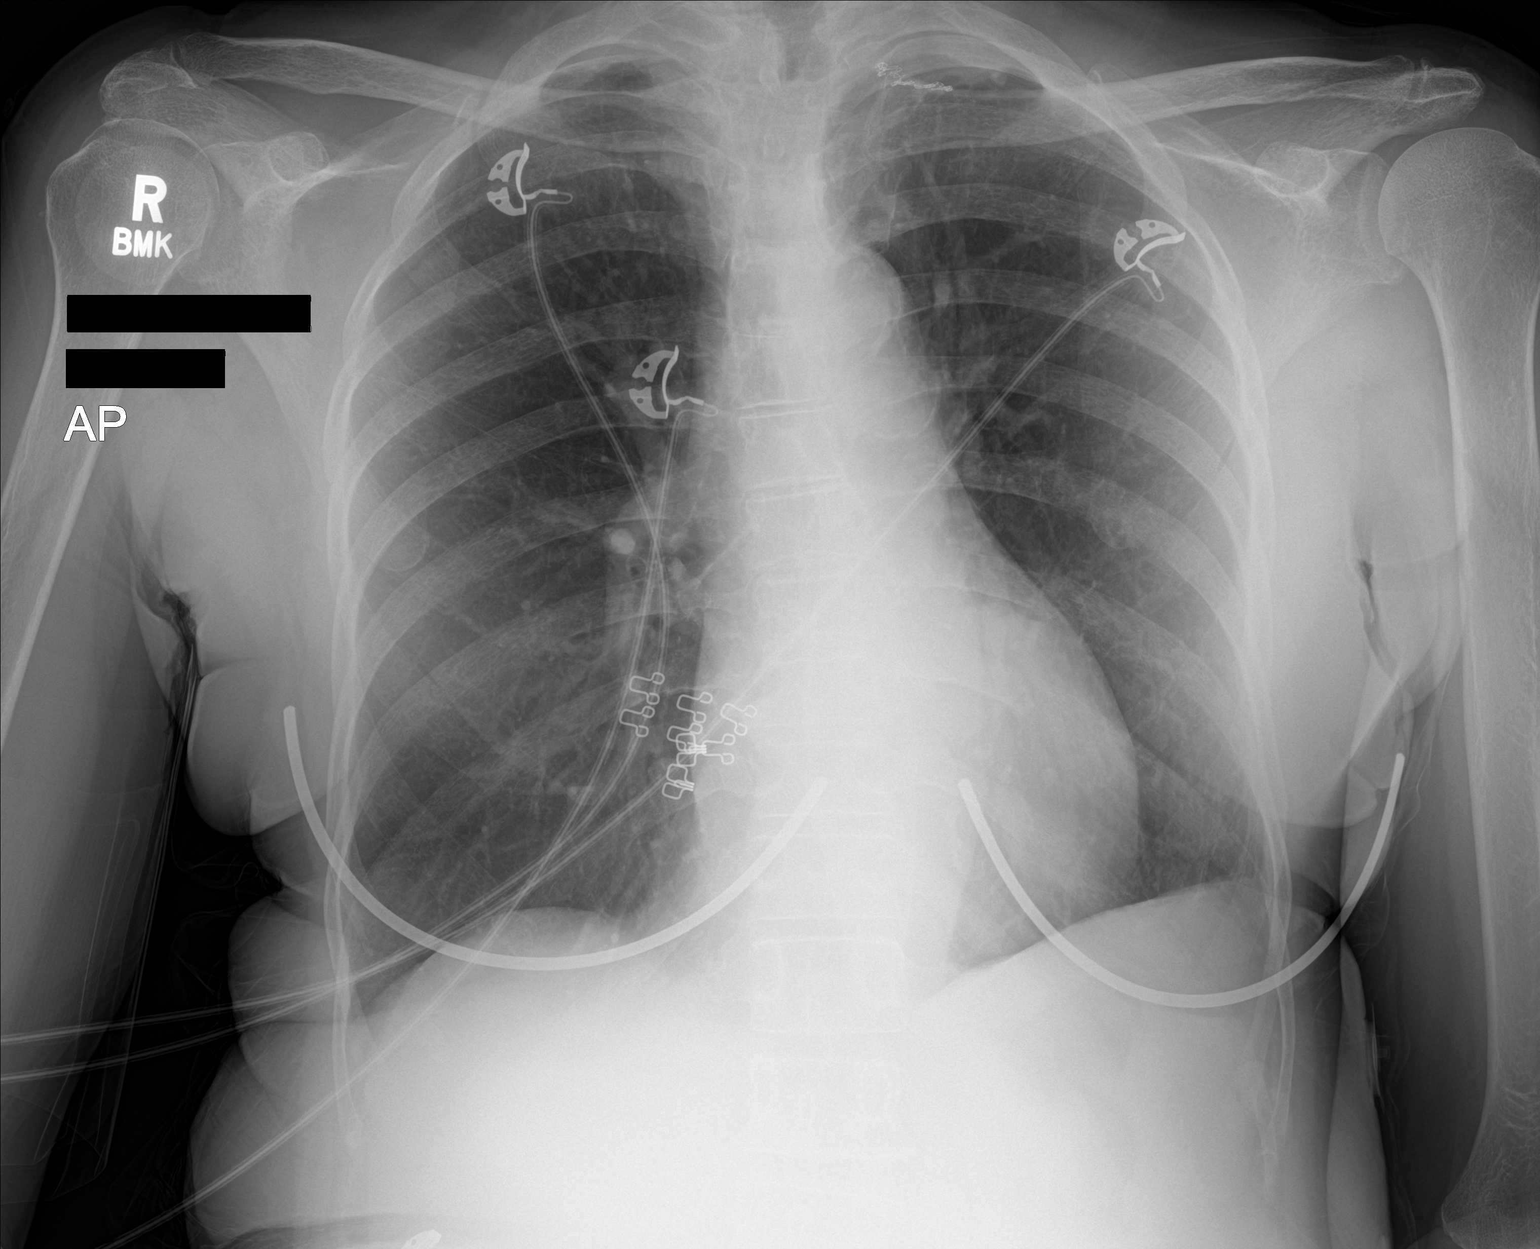

[2 of 2 positions shown; findings below may reference images not displayed]

FINDINGS: Lungs are clear. Postsurgical changes in the left lung apex. No
pleural effusion or pneumothorax.

The heart is normal in size.
IMPRESSION: No evidence of acute cardiopulmonary disease.

## 2021-11-04 ENCOUNTER — Other Ambulatory Visit: Payer: Self-pay

## 2021-11-04 ENCOUNTER — Other Ambulatory Visit: Payer: Medicare Other

## 2021-11-04 DIAGNOSIS — I10 Essential (primary) hypertension: Secondary | ICD-10-CM

## 2021-11-04 DIAGNOSIS — E785 Hyperlipidemia, unspecified: Secondary | ICD-10-CM | POA: Diagnosis not present

## 2021-11-04 DIAGNOSIS — R634 Abnormal weight loss: Secondary | ICD-10-CM

## 2021-11-05 LAB — LIPID PANEL
Cholesterol: 124 mg/dL (ref <200)
HDL: 52 mg/dL (ref >=50)
LDL Cholesterol (Calc): 54 mg/dL (calc)
Non-HDL Cholesterol (Calc): 72 mg/dL (calc) (ref <130)
Total CHOL/HDL Ratio: 2.4 (calc) (ref <5.0)
Triglycerides: 93 mg/dL (ref <150)

## 2021-11-05 LAB — COMPLETE METABOLIC PANEL WITH GFR
AG Ratio: 1.9 (calc) (ref 1.0–2.5)
ALT: 22 U/L (ref 6–29)
AST: 20 U/L (ref 10–35)
Albumin: 4.1 g/dL (ref 3.6–5.1)
Alkaline phosphatase (APISO): 67 U/L (ref 37–153)
BUN/Creatinine Ratio: 17 (calc) (ref 6–22)
BUN: 17 mg/dL (ref 7–25)
CO2: 22 mmol/L (ref 20–32)
Calcium: 9.1 mg/dL (ref 8.6–10.4)
Chloride: 111 mmol/L — ABNORMAL HIGH (ref 98–110)
Creat: 1.03 mg/dL — ABNORMAL HIGH (ref 0.60–0.95)
Globulin: 2.2 g/dL (calc) (ref 1.9–3.7)
Glucose, Bld: 83 mg/dL (ref 65–99)
Potassium: 4 mmol/L (ref 3.5–5.3)
Sodium: 145 mmol/L (ref 135–146)
Total Bilirubin: 0.4 mg/dL (ref 0.2–1.2)
Total Protein: 6.3 g/dL (ref 6.1–8.1)
eGFR: 55 mL/min/{1.73_m2} — ABNORMAL LOW (ref >=60)

## 2021-11-05 LAB — CBC WITH DIFFERENTIAL/PLATELET
Absolute Monocytes: 645 cells/uL (ref 200–950)
Basophils Absolute: 37 cells/uL (ref 0–200)
Basophils Relative: 0.3 %
Eosinophils Absolute: 124 cells/uL (ref 15–500)
Eosinophils Relative: 1 %
HCT: 40.9 % (ref 35.0–45.0)
Hemoglobin: 13.7 g/dL (ref 11.7–15.5)
Lymphs Abs: 2195 cells/uL (ref 850–3900)
MCH: 31.4 pg (ref 27.0–33.0)
MCHC: 33.5 g/dL (ref 32.0–36.0)
MCV: 93.6 fL (ref 80.0–100.0)
MPV: 10.3 fL (ref 7.5–12.5)
Monocytes Relative: 5.2 %
Neutro Abs: 9399 cells/uL — ABNORMAL HIGH (ref 1500–7800)
Neutrophils Relative %: 75.8 %
Platelets: 211 10*3/uL (ref 140–400)
RBC: 4.37 10*6/uL (ref 3.80–5.10)
RDW: 12.2 % (ref 11.0–15.0)
Total Lymphocyte: 17.7 %
WBC: 12.4 10*3/uL — ABNORMAL HIGH (ref 3.8–10.8)

## 2021-11-05 LAB — TSH: TSH: 0.84 mIU/L (ref 0.40–4.50)

## 2021-11-06 ENCOUNTER — Ambulatory Visit (INDEPENDENT_AMBULATORY_CARE_PROVIDER_SITE_OTHER): Payer: Medicare Other | Admitting: Family

## 2021-11-06 ENCOUNTER — Encounter: Payer: Self-pay | Admitting: Family

## 2021-11-06 ENCOUNTER — Other Ambulatory Visit: Payer: Self-pay

## 2021-11-06 VITALS — BP 130/88 | HR 67 | Temp 97.9°F | Resp 16 | Ht 66.0 in | Wt 148.4 lb

## 2021-11-06 DIAGNOSIS — G301 Alzheimer's disease with late onset: Secondary | ICD-10-CM

## 2021-11-06 DIAGNOSIS — F0280 Dementia in other diseases classified elsewhere without behavioral disturbance: Secondary | ICD-10-CM

## 2021-11-06 DIAGNOSIS — M5441 Lumbago with sciatica, right side: Secondary | ICD-10-CM | POA: Diagnosis not present

## 2021-11-06 DIAGNOSIS — R2681 Unsteadiness on feet: Secondary | ICD-10-CM | POA: Diagnosis not present

## 2021-11-06 DIAGNOSIS — G8929 Other chronic pain: Secondary | ICD-10-CM | POA: Diagnosis not present

## 2021-11-06 DIAGNOSIS — M25552 Pain in left hip: Secondary | ICD-10-CM

## 2021-11-06 DIAGNOSIS — E785 Hyperlipidemia, unspecified: Secondary | ICD-10-CM

## 2021-11-06 DIAGNOSIS — D72829 Elevated white blood cell count, unspecified: Secondary | ICD-10-CM

## 2021-11-06 DIAGNOSIS — Z23 Encounter for immunization: Secondary | ICD-10-CM

## 2021-11-06 DIAGNOSIS — N3091 Cystitis, unspecified with hematuria: Secondary | ICD-10-CM | POA: Diagnosis not present

## 2021-11-06 DIAGNOSIS — I10 Essential (primary) hypertension: Secondary | ICD-10-CM

## 2021-11-06 DIAGNOSIS — R630 Anorexia: Secondary | ICD-10-CM

## 2021-11-06 LAB — POCT URINALYSIS DIPSTICK
Bilirubin, UA: NEGATIVE
Glucose, UA: NEGATIVE
Ketones, UA: POSITIVE
Nitrite, UA: POSITIVE
Protein, UA: NEGATIVE
Spec Grav, UA: 1.025 (ref 1.010–1.025)
Urobilinogen, UA: NEGATIVE E.U./dL — AB
pH, UA: 5 (ref 5.0–8.0)

## 2021-11-06 MED ORDER — TETANUS-DIPHTH-ACELL PERTUSSIS 5-2.5-18.5 LF-MCG/0.5 IM SUSP: 0.5000 mL | Freq: Once | INTRAMUSCULAR | 0 refills | Status: AC

## 2021-11-06 MED ORDER — MELOXICAM 7.5 MG PO TABS: 7.5000 mg | ORAL_TABLET | Freq: Every day | ORAL | 0 refills | Status: DC

## 2021-11-06 NOTE — Patient Instructions (Signed)
Please get your Tetanus vaccine,Influenza vaccine,Shingles vaccine and COVID-19 booster vaccine at your pharmacy.

## 2021-11-06 NOTE — Progress Notes (Signed)
Provider: Marlowe Sax FNP-C   Kaitrin Seybold, Nelda Bucks, NP  Patient Care Team: Yer Castello, Nelda Bucks, NP as PCP - General (Family Medicine)  Extended Emergency Contact Information Primary Emergency Contact: Irania, Durell Mobile Phone: [phone] Relation: Son Secondary Emergency Contact: Lang Snow Mobile Phone: [phone] Relation: Daughter  Code Status:  Full Code  Goals of care: Advanced Directive information Advanced Directives 11/06/2021  Does Patient Have a Medical Advance Directive? Yes  Type of Advance Directive Living will  Does patient want to make changes to medical advance directive? No - Patient declined  Copy of [place] in Chart? -  Would patient like information on creating a medical advance directive? -     Chief Complaint  Patient presents with   Medical Management of Chronic Issues    6 month follow up.   Health Maintenance    Discuss the need for Dexa scan.    Immunizations    Discuss the need for Tetanus vaccine, Shingrix vaccine, Covid Booster, and Influenza vaccine.     HPI:  Pt is a 80 y.o. female seen today for medical management of chronic diseases. She is here  with her son today.He states patient lower back and right hip pain has worsen.Recently gave patient his own Tylenol # 3 for pain which seems to help with pain. She continues to follow up with Neurologist for late onset Alzheimer's.son states memory loss has worsen since last seen here. She was seen by Neuro on 07/02/2021 advised to follow up in 6 months. No recent fall episode  Has had a 10 lbs weight gain since last visit.No edema or shortness of breath.    Past Medical History:  Diagnosis Date   Abnormal chest x-ray    Abnormal results of liver function studies    Abnormal thyroid screen (blood)    Acute sinusitis    Alcohol screening    Anemia, deficiency    Arthropathy    BMI 22.0-22.9, adult    Carotid stenosis, bilateral    Carotid stenosis,  bilateral    Cellulitis    Chronic kidney disease, stage III (moderate) (HCC)    Closed fracture of sacrum and coccyx, with routine healing, subsequent encounter    Colon cancer screening    Colon polyp    Dementia ([place])    Dizzy spells    Encounter for screening mammogram for breast cancer    Facet arthropathy, lumbar    Fatty pancreas    Generalized abdominal or pelvic swelling or mass or lump    Glaucoma screening    H/O mammogram 2019   Per [place] new patient packet   Hepatomegalia    High risk medication use    Hospital discharge follow-up    Hx of colonoscopy 2016   Per [place] new patient packet   Hypertension    Hypertriglyceridemia    Influenza vaccination declined by patient    Low back pain    Lumbar disc disease with radiculopathy    Lung nodule    Magnesium deficiency    Medical non-compliance    Menopause    Microscopic hematuria    Mixed dyslipidemia    Neurodegenerative cognitive impairment ([place])    Osteoarthritis, hip, bilateral    Overweight    Personal history of smoking    Plantar fasciitis    Poor appetite    Refusal of influenza vaccine by provider    Renal cyst, left    Right atrial enlargement    Right hip  pain    Sciatica of right side    Screening for depression    Screening for HIV (human immunodeficiency virus)    Screening for STDs (sexually transmitted diseases)    Senile dementia without behavioral disturbance (HCC)    Shortness of breath    Spondylolisthesis, grade 1    Unintentional weight loss    URI, acute    Urinary frequency    UTI (urinary tract infection), bacterial    Vitamin D deficiency    History reviewed. No pertinent surgical history.  No Known Allergies  Allergies as of 11/06/2021   No Known Allergies      Medication List        Accurate as of November 06, 2021 11:01 AM. If you have any questions, ask your nurse or doctor.          acetaminophen 500 MG tablet Commonly known as: TYLENOL Take 2 tablets  (1,000 mg total) by mouth in the morning and at bedtime.   atorvastatin 10 MG tablet Commonly known as: LIPITOR TAKE 1 TABLET BY MOUTH AT  BEDTIME   cholecalciferol 25 MCG (1000 UNIT) tablet Commonly known as: VITAMIN D3 Take 1,000 Units by mouth daily.   donepezil 10 MG tablet Commonly known as: ARICEPT Take 10 mg by mouth at bedtime. Before Bedtime.   Ensure Take 237 mLs by mouth daily.   memantine 10 MG tablet Commonly known as: NAMENDA Take 10 mg by mouth 2 (two) times daily.   mirtazapine 7.5 MG tablet Commonly known as: REMERON TAKE 1 TABLET BY MOUTH AT  BEDTIME        Review of Systems  Constitutional:  Negative for appetite change, chills, fatigue, fever and unexpected weight change.  HENT:  Negative for congestion, dental problem, ear discharge, ear pain, facial swelling, hearing loss, nosebleeds, postnasal drip, rhinorrhea, sinus pressure, sinus pain, sneezing, sore throat, tinnitus and trouble swallowing.   Eyes:  Positive for visual disturbance. Negative for pain, discharge, redness and itching.       Wears eye glasses   Respiratory:  Negative for cough, chest tightness, shortness of breath and wheezing.   Cardiovascular:  Negative for chest pain, palpitations and leg swelling.  Gastrointestinal:  Negative for abdominal distention, abdominal pain, blood in stool, constipation, diarrhea, nausea and vomiting.  Endocrine: Negative for cold intolerance, heat intolerance, polydipsia, polyphagia and polyuria.  Genitourinary:  Negative for difficulty urinating, dysuria, flank pain, frequency and urgency.  Musculoskeletal:  Positive for arthralgias, back pain and gait problem. Negative for joint swelling, myalgias, neck pain and neck stiffness.       Lower back pain and right hip pain   Skin:  Negative for color change, pallor, rash and wound.  Neurological:  Negative for dizziness, syncope, speech difficulty, weakness, light-headedness, numbness and headaches.   Hematological:  Does not bruise/bleed easily.  Psychiatric/Behavioral:  Negative for agitation, behavioral problems, confusion, hallucinations, self-injury, sleep disturbance and suicidal ideas. The patient is not nervous/anxious.        Memory loss    Immunization History  Administered Date(s) Administered   Influenza-Unspecified 08/25/1991   MMR 05/28/1999   PFIZER(Purple Top)SARS-COV-2 Vaccination 12/29/2019, 01/23/2020   Pneumococcal Polysaccharide-23 05/06/2021   Pertinent  Health Maintenance Due  Topic Date Due   DEXA SCAN  Never done   INFLUENZA VACCINE  06/17/2021   Fall Risk 04/11/2021 05/06/2021 07/31/2021 08/27/2021 11/06/2021  Falls in the past year? - 0 - 0 0  Was there an injury with Fall? - 0 -  0 0  Fall Risk Category Calculator - 0 - 0 0  Fall Risk Category - Low - Low Low  Patient Fall Risk Level Low fall risk Low fall risk Moderate fall risk Low fall risk Low fall risk  Patient at Risk for Falls Due to - No Fall Risks - No Fall Risks No Fall Risks  Fall risk Follow up - - - Falls evaluation completed Falls evaluation completed   Functional Status Survey:    Vitals:   11/06/21 1044  BP: 130/88  Pulse: 67  Resp: 16  Temp: 97.9 F (36.6 C)  SpO2: 94%  Weight: 148 lb 6.4 oz (67.3 kg)  Height: 5' 6"  (1.676 m)   Body mass index is 23.95 kg/m. Physical Exam Vitals reviewed.  Constitutional:      General: She is not in acute distress.    Appearance: Normal appearance. She is normal weight. She is not ill-appearing or diaphoretic.  HENT:     Head: Normocephalic.     Right Ear: Tympanic membrane, ear canal and external ear normal. There is no impacted cerumen.     Left Ear: Tympanic membrane, ear canal and external ear normal. There is no impacted cerumen.     Nose: Nose normal. No congestion or rhinorrhea.     Mouth/Throat:     Mouth: Mucous membranes are moist.     Pharynx: Oropharynx is clear. No oropharyngeal exudate or posterior oropharyngeal erythema.   Eyes:     General: No scleral icterus.       Right eye: No discharge.        Left eye: No discharge.     Extraocular Movements: Extraocular movements intact.     Conjunctiva/sclera: Conjunctivae normal.     Pupils: Pupils are equal, round, and reactive to light.  Neck:     Vascular: No carotid bruit.  Cardiovascular:     Rate and Rhythm: Normal rate and regular rhythm.     Pulses: Normal pulses.     Heart sounds: Normal heart sounds. No murmur heard.   No friction rub. No gallop.  Pulmonary:     Effort: Pulmonary effort is normal. No respiratory distress.     Breath sounds: Normal breath sounds. No wheezing, rhonchi or rales.  Chest:     Chest wall: No tenderness.  Abdominal:     General: Bowel sounds are normal. There is no distension.     Palpations: Abdomen is soft. There is no mass.     Tenderness: There is no abdominal tenderness. There is no right CVA tenderness, left CVA tenderness, guarding or rebound.  Musculoskeletal:        General: No swelling or tenderness. Normal range of motion.     Cervical back: Normal range of motion. No rigidity or tenderness.     Right lower leg: No edema.     Left lower leg: No edema.     Comments: Unsteady gait walks with a walker   Lymphadenopathy:     Cervical: No cervical adenopathy.  Skin:    General: Skin is warm and dry.     Coloration: Skin is not pale.     Findings: No bruising, erythema, lesion or rash.  Neurological:     Mental Status: She is alert. Mental status is at baseline.     Cranial Nerves: No cranial nerve deficit.     Sensory: No sensory deficit.     Motor: No weakness.     Coordination: Coordination normal.  Gait: Gait abnormal.  Psychiatric:        Mood and Affect: Mood normal.        Speech: Speech normal.        Behavior: Behavior normal.        Thought Content: Thought content normal.        Cognition and Memory: Memory is impaired.        Judgment: Judgment normal.    Labs reviewed: Recent Labs     05/01/21 1003 07/31/21 2125 11/04/21 0959  NA 144 140 145  K 4.4 4.3 4.0  CL 109 107 111*  CO2 26 25 22   GLUCOSE 85 93 83  BUN 19 27* 17  CREATININE 1.29* 1.07* 1.03*  CALCIUM 9.6 9.0 9.1   Recent Labs    04/11/21 1254 05/01/21 1003 07/31/21 2125 11/04/21 0959  AST 18 14 22 20   ALT 18 12 17 22   ALKPHOS 68  --  72  --   BILITOT 0.7 0.5 0.6 0.4  PROT 6.6 6.1 7.0 6.3  ALBUMIN 3.9  --  4.0  --    Recent Labs    05/01/21 1003 07/31/21 2125 11/04/21 0959  WBC 6.3 9.1 12.4*  NEUTROABS 3,956 5.9 9,399*  HGB 13.9 13.4 13.7  HCT 42.0 39.2 40.9  MCV 93.1 91.6 93.6  PLT 217 230 211   Lab Results  Component Value Date   TSH 0.84 11/04/2021   No results found for: HGBA1C Lab Results  Component Value Date   CHOL 124 11/04/2021   HDL 52 11/04/2021   LDLCALC 54 11/04/2021   TRIG 93 11/04/2021   CHOLHDL 2.4 11/04/2021    Significant Diagnostic Results in last 30 days:  No results found.  Assessment/Plan 1. Essential hypertension B/p well controlled  - CBC with Differential/Platelet; Future - CMP with eGFR([facility]); Future - TSH; Future  2. Chronic left hip pain Start on meloxicam advised to avoid sedative medication to prevent falls.  Advised to take with food  - meloxicam (MOBIC) 7.5 MG tablet; Take 1 tablet (7.5 mg total) by mouth daily.  Dispense: 30 tablet; Refill: 0 - Ambulatory referral to Neurosurgery  3. Late onset Alzheimer's disease without behavioral disturbance ([place]) Declining in memory.progressive decline expected. - continue with supportive care  - continue on Donepezil and memantine  - continue on Remeron   4. Hyperlipidemia LDL goal <100 Continue on atorvastatin  - Lipid panel; Future  5. Chronic bilateral low back pain with right-sided sciatica Has worsen son gave his own tylenol # 3 which helped with pain.request script for Tylenol 3 3 but I've advised to avoid sedative medication to prevent falls.  - meloxicam (MOBIC) 7.5 MG tablet;  Take 1 tablet (7.5 mg total) by mouth daily.  Dispense: 30 tablet; Refill: 0 - Ambulatory referral to Neurosurgery  6. Unsteady gait Fall and safety precaution  - POC Urinalysis Dipstick  7. Loss of appetite Continue on Remeron   8. Leukocytosis, unspecified type WBC 12.4  Afebrile.unclear etiology will obtain labs and urine specimen to rule out acute etiology - CBC with Differential/Platelet - Urine Culture  9. Need for Tdap vaccination Afebrile.  Please get your Tetanus vaccine,Influenza vaccine,Shingles vaccine and COVID-19 booster vaccine at your pharmacy.   - Tdap (BOOSTRIX) 5-2.5-18.5 LF-MCG/0.5 injection; Inject 0.5 mLs into the muscle once for 1 dose.  Dispense: 0.5 mL; Refill: 0  Family/ staff Communication: Reviewed plan of care with patient and son verbalized understanding   Labs/tests ordered:  - CBC with Differential/Platelet -  CMP with eGFR([facility]) - TSH - Hgb A1C - Lipid panel - POC Urinalysis Dipstick - Urine Culture  Next Appointment : 6 months for medical management of chronic issues.Fasting Labs prior to visit.    Sandrea Hughs, NP

## 2021-11-08 ENCOUNTER — Telehealth: Payer: Self-pay

## 2021-11-08 LAB — CBC WITH DIFFERENTIAL/PLATELET
Absolute Monocytes: 592 cells/uL (ref 200–950)
Basophils Absolute: 29 cells/uL (ref 0–200)
Basophils Relative: 0.3 %
Eosinophils Absolute: 78 cells/uL (ref 15–500)
Eosinophils Relative: 0.8 %
HCT: 35.6 % (ref 35.0–45.0)
Hemoglobin: 11.9 g/dL (ref 11.7–15.5)
Lymphs Abs: 2241 cells/uL (ref 850–3900)
MCH: 31 pg (ref 27.0–33.0)
MCHC: 33.4 g/dL (ref 32.0–36.0)
MCV: 92.7 fL (ref 80.0–100.0)
MPV: 10.5 fL (ref 7.5–12.5)
Monocytes Relative: 6.1 %
Neutro Abs: 6761 cells/uL (ref 1500–7800)
Neutrophils Relative %: 69.7 %
Platelets: 215 10*3/uL (ref 140–400)
RBC: 3.84 10*6/uL (ref 3.80–5.10)
RDW: 12.4 % (ref 11.0–15.0)
Total Lymphocyte: 23.1 %
WBC: 9.7 10*3/uL (ref 3.8–10.8)

## 2021-11-08 LAB — URINE CULTURE
MICRO NUMBER:: 12786492
MICRO NUMBER:: 12789242
SPECIMEN QUALITY:: ADEQUATE
SPECIMEN QUALITY:: ADEQUATE

## 2021-11-08 MED ORDER — CIPROFLOXACIN HCL 500 MG PO TABS: 500.0000 mg | ORAL_TABLET | Freq: Two times a day (BID) | ORAL | 0 refills | Status: AC

## 2021-11-08 NOTE — Telephone Encounter (Signed)
-----   Message from Caesar Bookman, NP sent at 11/08/2021  4:25 PM EST ----- Urine culture shows urinary tract infection.start on cipro 500 mg tablet one by mouth twice daily x 7 day Florastor 250 mg capsule one by mouth twice daily x 10 days to prevent diarrhea from antibiotics

## 2021-11-08 NOTE — Telephone Encounter (Signed)
Rx sent to pharmacy   

## 2021-11-20 DIAGNOSIS — M4316 Spondylolisthesis, lumbar region: Secondary | ICD-10-CM | POA: Diagnosis not present

## 2021-11-20 DIAGNOSIS — M47816 Spondylosis without myelopathy or radiculopathy, lumbar region: Secondary | ICD-10-CM | POA: Diagnosis not present

## 2021-11-20 DIAGNOSIS — R030 Elevated blood-pressure reading, without diagnosis of hypertension: Secondary | ICD-10-CM | POA: Diagnosis not present

## 2021-11-25 DIAGNOSIS — M47816 Spondylosis without myelopathy or radiculopathy, lumbar region: Secondary | ICD-10-CM | POA: Diagnosis not present

## 2021-11-25 DIAGNOSIS — M4316 Spondylolisthesis, lumbar region: Secondary | ICD-10-CM | POA: Diagnosis not present

## 2021-11-26 DIAGNOSIS — M47816 Spondylosis without myelopathy or radiculopathy, lumbar region: Secondary | ICD-10-CM | POA: Diagnosis not present

## 2021-12-05 DIAGNOSIS — M4316 Spondylolisthesis, lumbar region: Secondary | ICD-10-CM | POA: Diagnosis not present

## 2022-01-02 DIAGNOSIS — Z79899 Other long term (current) drug therapy: Secondary | ICD-10-CM | POA: Diagnosis not present

## 2022-03-29 ENCOUNTER — Encounter (HOSPITAL_BASED_OUTPATIENT_CLINIC_OR_DEPARTMENT_OTHER): Payer: Self-pay | Admitting: Emergency Medicine

## 2022-03-29 ENCOUNTER — Emergency Department (HOSPITAL_BASED_OUTPATIENT_CLINIC_OR_DEPARTMENT_OTHER)
Admission: EM | Admit: 2022-03-29 | Discharge: 2022-03-29 | Disposition: A | Discharge status: Home / Self Care | Payer: Medicare Other | Attending: Emergency Medicine | Admitting: Emergency Medicine

## 2022-03-29 ENCOUNTER — Other Ambulatory Visit: Payer: Self-pay

## 2022-03-29 ENCOUNTER — Emergency Department (HOSPITAL_BASED_OUTPATIENT_CLINIC_OR_DEPARTMENT_OTHER): Payer: Medicare Other

## 2022-03-29 DIAGNOSIS — R600 Localized edema: Secondary | ICD-10-CM | POA: Diagnosis not present

## 2022-03-29 DIAGNOSIS — R9431 Abnormal electrocardiogram [ECG] [EKG]: Secondary | ICD-10-CM | POA: Diagnosis not present

## 2022-03-29 DIAGNOSIS — M7989 Other specified soft tissue disorders: Secondary | ICD-10-CM | POA: Diagnosis not present

## 2022-03-29 LAB — COMPREHENSIVE METABOLIC PANEL
ALT: 34 U/L (ref 0–44)
AST: 24 U/L (ref 15–41)
Albumin: 3.6 g/dL (ref 3.5–5.0)
Alkaline Phosphatase: 70 U/L (ref 38–126)
Anion gap: 4 — ABNORMAL LOW (ref 5–15)
BUN: 25 mg/dL — ABNORMAL HIGH (ref 8–23)
CO2: 26 mmol/L (ref 22–32)
Calcium: 8.9 mg/dL (ref 8.9–10.3)
Chloride: 112 mmol/L — ABNORMAL HIGH (ref 98–111)
Creatinine, Ser: 1.32 mg/dL — ABNORMAL HIGH (ref 0.44–1.00)
GFR, Estimated: 41 mL/min — ABNORMAL LOW (ref >60)
Glucose, Bld: 95 mg/dL (ref 70–99)
Potassium: 4.7 mmol/L (ref 3.5–5.1)
Sodium: 142 mmol/L (ref 135–145)
Total Bilirubin: 0.7 mg/dL (ref 0.3–1.2)
Total Protein: 6.3 g/dL — ABNORMAL LOW (ref 6.5–8.1)

## 2022-03-29 LAB — CBC WITH DIFFERENTIAL/PLATELET
Abs Immature Granulocytes: 0.01 10*3/uL (ref 0.00–0.07)
Basophils Absolute: 0 10*3/uL (ref 0.0–0.1)
Basophils Relative: 0 %
Eosinophils Absolute: 0.1 10*3/uL (ref 0.0–0.5)
Eosinophils Relative: 2 %
HCT: 38.7 % (ref 36.0–46.0)
Hemoglobin: 12.7 g/dL (ref 12.0–15.0)
Immature Granulocytes: 0 %
Lymphocytes Relative: 34 %
Lymphs Abs: 2.3 10*3/uL (ref 0.7–4.0)
MCH: 31 pg (ref 26.0–34.0)
MCHC: 32.8 g/dL (ref 30.0–36.0)
MCV: 94.4 fL (ref 80.0–100.0)
Monocytes Absolute: 0.5 10*3/uL (ref 0.1–1.0)
Monocytes Relative: 7 %
Neutro Abs: 3.9 10*3/uL (ref 1.7–7.7)
Neutrophils Relative %: 57 %
Platelets: 203 10*3/uL (ref 150–400)
RBC: 4.1 MIL/uL (ref 3.87–5.11)
RDW: 12.7 % (ref 11.5–15.5)
WBC: 6.8 10*3/uL (ref 4.0–10.5)
nRBC: 0 % (ref 0.0–0.2)

## 2022-03-29 LAB — BRAIN NATRIURETIC PEPTIDE: B Natriuretic Peptide: 30.4 pg/mL (ref 0.0–100.0)

## 2022-03-29 MED ORDER — FUROSEMIDE 20 MG PO TABS: 20.0000 mg | ORAL_TABLET | Freq: Every day | ORAL | 0 refills | Status: DC

## 2022-03-29 NOTE — ED Provider Notes (Signed)
?[facility] EMERGENCY DEPARTMENT ?Provider Note ? ? ?CSN: [id number] ?Arrival date & time: 03/29/22  1109 ? ?  ? ?History ? ?Chief Complaint  ?Patient presents with  ? Foot Swelling  ? ? ?Christina Perkins is a 81 y.o. female. ? ?Pt's son reports he noticed swelling in patient's feet on Thursday he reports her feet were normal on Wednesday.  He had difficulty getting socks to fit her today and she had to wear her bedroom slippers because her feet were so swollen patient has a history of dementia.  She is not able to give a history she denies any area of pain.  Patient's son reports patient does not have any history of heart failure she has not had problems with edema in the past.  He reports it is supple that she could have fallen.  He has not noticed any areas of bruising.  Patient is acting normally. ? ?The history is provided by a caregiver and a relative. No language interpreter was used.  ?Foot Pain ?This is a new problem. The problem occurs constantly. The problem has been gradually worsening. Nothing aggravates the symptoms. Nothing relieves the symptoms. She has tried nothing for the symptoms. The treatment provided no relief.  ? ?  ? ?Home Medications ?Prior to Admission medications   ?Medication Sig Start Date End Date Taking? Authorizing Provider  ?acetaminophen (TYLENOL) 500 MG tablet Take 2 tablets (1,000 mg total) by mouth in the morning and at bedtime. 07/27/20   Ngetich, Dinah C, NP  ?atorvastatin (LIPITOR) 10 MG tablet TAKE 1 TABLET BY MOUTH AT  BEDTIME 08/23/21   Ngetich, Dinah C, NP  ?cholecalciferol (VITAMIN D3) 25 MCG (1000 UNIT) tablet Take 1,000 Units by mouth daily.    [provider]  ?donepezil (ARICEPT) 10 MG tablet Take 10 mg by mouth at bedtime. Before Bedtime.    [provider]  ?Ensure (ENSURE) Take 237 mLs by mouth daily.    [provider]  ?meloxicam (MOBIC) 7.5 MG tablet Take 1 tablet (7.5 mg total) by mouth daily. 11/06/21   Ngetich, Dinah C, NP   ?memantine (NAMENDA) 10 MG tablet Take 10 mg by mouth 2 (two) times daily.    [provider]  ?mirtazapine (REMERON) 7.5 MG tablet TAKE 1 TABLET BY MOUTH AT  BEDTIME 09/20/21   Ngetich, Dinah C, NP  ?saccharomyces boulardii (FLORASTOR) 250 MG capsule Take 250 mg by mouth 2 (two) times daily. For 10 days    [provider]  ?   ? ?Allergies    ?Patient has no known allergies.   ? ?Review of Systems   ?Review of Systems  ?Musculoskeletal:  Positive for joint swelling.  ?All other systems reviewed and are negative. ? ?Physical Exam ?Updated Vital Signs ?BP (!) 167/78   Pulse (!) 56   Temp 97.8 ?F (36.6 ?C) (Oral)   Resp 16   Ht 5' 7.5" (1.715 m)   Wt 68.9 kg   SpO2 100%   BMI 23.46 kg/m?  ?Physical Exam ?Vitals and nursing note reviewed.  ?Constitutional:   ?   Appearance: She is well-developed.  ?HENT:  ?   Head: Normocephalic.  ?   Mouth/Throat:  ?   Mouth: Mucous membranes are moist.  ?Cardiovascular:  ?   Rate and Rhythm: Normal rate.  ?Pulmonary:  ?   Effort: Pulmonary effort is normal.  ?Abdominal:  ?   General: There is no distension.  ?Musculoskeletal:     ?   General: Swelling  present. No tenderness.  ?Neurological:  ?   General: No focal deficit present.  ?   Mental Status: She is alert and oriented to person, place, and time.  ?Psychiatric:     ?   Mood and Affect: Mood normal.  ? ? ?ED Results / Procedures / Treatments   ?Labs ?(all labs ordered are listed, but only abnormal results are displayed) ?Labs Reviewed  ?COMPREHENSIVE METABOLIC PANEL - Abnormal; Notable for the following components:  ?    Result Value  ? Chloride 112 (*)   ? BUN 25 (*)   ? Creatinine, Ser 1.32 (*)   ? Total Protein 6.3 (*)   ? GFR, Estimated 41 (*)   ? Anion gap 4 (*)   ? All other components within normal limits  ?CBC WITH DIFFERENTIAL/PLATELET  ?BRAIN NATRIURETIC PEPTIDE  ? ? ?EKG ?EKG Interpretation ? ?Date/Time:  Saturday Mar 29 2022 11:26:16 EDT ?Ventricular Rate:  76 ?PR Interval:  163 ?QRS  Duration: 68 ?QT Interval:  351 ?QTC Calculation: 395 ?R Axis:   68 ?Text Interpretation: Sinus rhythm Borderline T abnormalities, diffuse leads Confirmed by Regan Lemming ([id number]) on 03/29/2022 11:29:12 AM ? ?Radiology ?DG Foot Complete Left ? ?Result Date: 03/29/2022 ?CLINICAL DATA:  Bilateral feet swelling. EXAM: LEFT FOOT - COMPLETE 3+ VIEW COMPARISON:  None Available. FINDINGS: No fracture or bone lesion. Mild asymmetric joint space narrowing at the first metatarsophalangeal joint consistent with osteoarthritis, with mild hallux valgus deformity. Remaining joints normally spaced and aligned. Small plantar and dorsal calcaneal spurs. Diffuse nonspecific soft tissue edema. IMPRESSION: 1. No fracture or acute skeletal abnormality. 2. Nonspecific soft tissue edema. Electronically Signed   By: Lajean Manes M.D.   On: 03/29/2022 13:03  ? ?DG Foot Complete Right ? ?Result Date: 03/29/2022 ?CLINICAL DATA:  Bilateral feet swelling. EXAM: RIGHT FOOT COMPLETE - 3+ VIEW COMPARISON:  None Available. FINDINGS: No fracture or bone lesion. Mild to moderate first metatarsophalangeal joint osteoarthritis. Chronic resorption versus resection of the distal aspect of the fifth toe proximal phalanx and adjacent base of the middle phalanx. Remaining joints are normally spaced and aligned. Small plantar and dorsal calcaneal spurs. Diffuse nonspecific soft tissue edema. IMPRESSION: 1. No fracture or acute skeletal abnormality. 2. Soft tissue swelling. Electronically Signed   By: Lajean Manes M.D.   On: 03/29/2022 13:01   ? ?Procedures ?Procedures  ? ? ?Medications Ordered in ED ?Medications - No data to display ? ?ED Course/ Medical Decision Making/ A&P ?  ?                        ?Medical Decision Making ?Pt has swelling in both feet.  Son unable to put her socks or shoes on.  ? ?Amount and/or Complexity of Data Reviewed ?Independent Historian: caregiver ?   Details: Pt has dementia her son provides her history ?External Data Reviewed:  labs and notes. ?   Details: Pt has had slight bun and creatine elevation in the past.  Neurology notes reviewed.  Pt has alzheimers ?Labs: ordered. Decision-making details documented in ED Course. ?   Details: Labs ordered reviewed and interpreted ?Radiology: ordered. Decision-making details documented in ED Course. ?   Details: Xray bilat feet show swelling  no fractures ?ECG/medicine tests: ordered and independent interpretation performed. Decision-making details documented in ED Course. ?   Details: EKG  no acute abnormaltiy ? ? ?[place] diagnosis includes injury to feet from fall swelling secondary to dependent edema congestive heart failure.  [place] and shows no evidence of congestive heart failure x-rays of bilateral feet are negative I suspect patient's symptoms are secondary to a dependent edema I will give 20 mg for the next 3 days I have advised patient's son to schedule her to follow-up with primary care physician for recheck he is to have patient elevate her feet ? ? ? ? ? ? ?Final Clinical Impression(s) / ED Diagnoses ?Final diagnoses:  ?Edema of both legs  ? ? ?Rx / DC Orders ?ED Discharge Orders   ? ?      Ordered  ?  furosemide (LASIX) 20 MG tablet  Daily       ? 03/29/22 1455  ? ?  ?  ? ?  ? ?An After Visit Summary was printed and given to the patient. ? ?  ?Fransico Meadow, [place] ?03/29/22 1455 ? ?  ?Regan Lemming, MD ?03/29/22 1554 ? ?

## 2022-03-29 NOTE — ED Triage Notes (Signed)
Pt w/ BLE feet swelling; son sts RT foot swollen Thurs, but resolved; he noticed they were both swollen today; pt denies pain/injury ?

## 2022-03-29 NOTE — Discharge Instructions (Addendum)
Return if any problems.  Elevate legs.  Follow up with primary care this week for recheck  ?

## 2022-03-29 NOTE — ED Notes (Signed)
Assisted upto Br  with the steady , pt did well , ua to lab ?

## 2022-05-07 ENCOUNTER — Other Ambulatory Visit: Payer: Self-pay | Admitting: Family

## 2022-05-07 DIAGNOSIS — E785 Hyperlipidemia, unspecified: Secondary | ICD-10-CM

## 2022-05-07 DIAGNOSIS — I10 Essential (primary) hypertension: Secondary | ICD-10-CM

## 2022-05-08 ENCOUNTER — Other Ambulatory Visit: Payer: Medicare Other

## 2022-05-14 ENCOUNTER — Ambulatory Visit (INDEPENDENT_AMBULATORY_CARE_PROVIDER_SITE_OTHER): Payer: Medicare Other | Admitting: Family

## 2022-05-14 ENCOUNTER — Encounter: Payer: Self-pay | Admitting: Family

## 2022-05-14 VITALS — BP 116/88 | HR 76 | Temp 96.7°F | Resp 16 | Ht 67.5 in | Wt 149.2 lb

## 2022-05-14 DIAGNOSIS — E785 Hyperlipidemia, unspecified: Secondary | ICD-10-CM

## 2022-05-14 DIAGNOSIS — I10 Essential (primary) hypertension: Secondary | ICD-10-CM

## 2022-05-14 DIAGNOSIS — Z23 Encounter for immunization: Secondary | ICD-10-CM | POA: Diagnosis not present

## 2022-05-14 NOTE — Progress Notes (Signed)
Provider: Marlowe Sax FNP-C   Phenix Grein, Nelda Bucks, NP  Patient Care Team: Robynne Roat, Nelda Bucks, NP as PCP - General (Family Medicine)  Extended Emergency Contact Information Primary Emergency Contact: Mickenzie, Stolar Mobile Phone: [phone] Relation: Son Secondary Emergency Contact: Lang Snow Mobile Phone: [phone] Relation: Daughter  Code Status:  Full Code  Goals of care: Advanced Directive information    05/14/2022   10:27 AM  Advanced Directives  Does Patient Have a Medical Advance Directive? Yes  Type of Advance Directive Living will  Does patient want to make changes to medical advance directive? No - Patient declined     Chief Complaint  Patient presents with   Medical Management of Chronic Issues    6 month follow up.   Health Maintenance    Discuss the need for Dexa scan.   Immunizations    Discuss the need for Covid Booster, Pne vaccine, and Shingrix vaccine.    HPI:  Pt is a 81 y.o. female seen today for 6 months medical management of chronic diseases.  Has some medical history of essential hypertension, hyperlipidemia, history of fall among others.  She denies any acute issue.  She is here with her daughter states no new concerns.  She is due for COVID-19 vaccine and Shingrix discussed with the daughter who presents to get vaccine at the pharmacy. Also due for pneumococcal vaccine 23 agrees to get vaccine today.  She denies any acute fever or chills.  Past Medical History:  Diagnosis Date   Abnormal chest x-ray    Abnormal results of liver function studies    Abnormal thyroid screen (blood)    Acute sinusitis    Alcohol screening    Anemia, deficiency    Arthropathy    BMI 22.0-22.9, adult    Carotid stenosis, bilateral    Carotid stenosis, bilateral    Cellulitis    Chronic kidney disease, stage III (moderate) (HCC)    Closed fracture of sacrum and coccyx, with routine healing, subsequent encounter    Colon cancer screening    Colon  polyp    Dementia ([place])    Dizzy spells    Encounter for screening mammogram for breast cancer    Facet arthropathy, lumbar    Fatty pancreas    Generalized abdominal or pelvic swelling or mass or lump    Glaucoma screening    H/O mammogram 2019   Per [place] new patient packet   Hepatomegalia    High risk medication use    Hospital discharge follow-up    Hx of colonoscopy 2016   Per [facility] new patient packet   Hypertension    Hypertriglyceridemia    Influenza vaccination declined by patient    Low back pain    Lumbar disc disease with radiculopathy    Lung nodule    Magnesium deficiency    Medical non-compliance    Menopause    Microscopic hematuria    Mixed dyslipidemia    Neurodegenerative cognitive impairment ([place])    Osteoarthritis, hip, bilateral    Overweight    Personal history of smoking    Plantar fasciitis    Poor appetite    Refusal of influenza vaccine by provider    Renal cyst, left    Right atrial enlargement    Right hip pain    Sciatica of right side    Screening for depression    Screening for HIV (human immunodeficiency virus)    Screening for STDs (sexually transmitted diseases)  Senile dementia without behavioral disturbance (HCC)    Shortness of breath    Spondylolisthesis, grade 1    Unintentional weight loss    URI, acute    Urinary frequency    UTI (urinary tract infection), bacterial    Vitamin D deficiency    History reviewed. No pertinent surgical history.  No Known Allergies  Allergies as of 05/14/2022   No Known Allergies      Medication List        Accurate as of May 14, 2022 11:06 AM. If you have any questions, ask your nurse or doctor.          acetaminophen 500 MG tablet Commonly known as: TYLENOL Take 2 tablets (1,000 mg total) by mouth in the morning and at bedtime.   atorvastatin 10 MG tablet Commonly known as: LIPITOR TAKE 1 TABLET BY MOUTH AT  BEDTIME   cholecalciferol 25 MCG (1000 UNIT) tablet Commonly  known as: VITAMIN D3 Take 1,000 Units by mouth daily.   donepezil 10 MG tablet Commonly known as: ARICEPT Take 10 mg by mouth at bedtime. Before Bedtime.   Ensure Take 237 mLs by mouth daily.   furosemide 20 MG tablet Commonly known as: Lasix Take 1 tablet (20 mg total) by mouth daily.   meloxicam 7.5 MG tablet Commonly known as: MOBIC Take 1 tablet (7.5 mg total) by mouth daily.   memantine 10 MG tablet Commonly known as: NAMENDA Take 10 mg by mouth 2 (two) times daily.   mirtazapine 7.5 MG tablet Commonly known as: REMERON TAKE 1 TABLET BY MOUTH AT  BEDTIME   saccharomyces boulardii 250 MG capsule Commonly known as: FLORASTOR Take 250 mg by mouth 2 (two) times daily. For 10 days        Review of Systems  Constitutional:  Negative for appetite change, chills, fatigue, fever and unexpected weight change.  HENT:  Negative for congestion, dental problem, ear discharge, ear pain, facial swelling, hearing loss, nosebleeds, postnasal drip, rhinorrhea, sinus pressure, sinus pain, sneezing, sore throat, tinnitus and trouble swallowing.   Eyes:  Negative for pain, discharge, redness, itching and visual disturbance.  Respiratory:  Negative for cough, chest tightness, shortness of breath and wheezing.   Cardiovascular:  Negative for chest pain, palpitations and leg swelling.  Gastrointestinal:  Negative for abdominal distention, abdominal pain, blood in stool, constipation, diarrhea, nausea and vomiting.  Endocrine: Negative for cold intolerance, heat intolerance, polydipsia, polyphagia and polyuria.  Genitourinary:  Negative for difficulty urinating, dysuria, flank pain, frequency and urgency.       Wears incontinent pull-ups  Musculoskeletal:  Positive for gait problem. Negative for arthralgias, back pain, joint swelling, myalgias, neck pain and neck stiffness.  Skin:  Negative for color change, pallor, rash and wound.  Neurological:  Negative for dizziness, syncope, speech  difficulty, weakness, light-headedness, numbness and headaches.  Hematological:  Does not bruise/bleed easily.  Psychiatric/Behavioral:  Negative for agitation, behavioral problems, confusion, hallucinations and sleep disturbance. The patient is not nervous/anxious.        Memory loss    Immunization History  Administered Date(s) Administered   Influenza-Unspecified 08/25/1991   MMR 05/28/1999   Moderna Sars-Covid-2 Vaccination 01/16/2022   PFIZER(Purple Top)SARS-COV-2 Vaccination 12/29/2019, 01/23/2020, 08/31/2020   Pneumococcal Polysaccharide-23 05/06/2021   Td 11/18/2021   Tdap 11/18/2021   Pertinent  Health Maintenance Due  Topic Date Due   DEXA SCAN  Never done   INFLUENZA VACCINE  06/17/2022      07/31/2021    8:41  PM 08/27/2021    3:53 PM 11/06/2021   10:30 AM 03/29/2022   11:31 AM 05/14/2022   10:27 AM  Fall Risk  Falls in the past year?  0 0  0  Was there an injury with Fall?  0 0  0  Fall Risk Category Calculator  0 0  0  Fall Risk Category  Low Low  Low  Patient Fall Risk Level Moderate fall risk Low fall risk Low fall risk High fall risk Low fall risk  Patient at Risk for Falls Due to  No Fall Risks No Fall Risks  No Fall Risks  Fall risk Follow up  Falls evaluation completed Falls evaluation completed  Falls evaluation completed   Functional Status Survey:    Vitals:   05/14/22 1022  BP: 116/88  Pulse: 76  Resp: 16  Temp: (!) 96.7 F (35.9 C)  SpO2: 91%  Weight: 149 lb 3.2 oz (67.7 kg)  Height: 5' 7.5" (1.715 m)   Body mass index is 23.02 kg/m. Physical Exam Vitals reviewed.  Constitutional:      General: She is not in acute distress.    Appearance: Normal appearance. She is normal weight. She is not ill-appearing or diaphoretic.  HENT:     Head: Normocephalic.     Right Ear: Tympanic membrane, ear canal and external ear normal. There is no impacted cerumen.     Left Ear: Tympanic membrane, ear canal and external ear normal. There is no impacted  cerumen.     Nose: Nose normal. No congestion or rhinorrhea.     Mouth/Throat:     Mouth: Mucous membranes are moist.     Pharynx: Oropharynx is clear. No oropharyngeal exudate or posterior oropharyngeal erythema.  Eyes:     General: No scleral icterus.       Right eye: No discharge.        Left eye: No discharge.     Extraocular Movements: Extraocular movements intact.     Conjunctiva/sclera: Conjunctivae normal.     Pupils: Pupils are equal, round, and reactive to light.  Neck:     Vascular: No carotid bruit.  Cardiovascular:     Rate and Rhythm: Normal rate and regular rhythm.     Pulses: Normal pulses.     Heart sounds: Normal heart sounds. No murmur heard.    No friction rub. No gallop.  Pulmonary:     Effort: Pulmonary effort is normal. No respiratory distress.     Breath sounds: Normal breath sounds. No wheezing, rhonchi or rales.  Chest:     Chest wall: No tenderness.  Abdominal:     General: Bowel sounds are normal. There is no distension.     Palpations: Abdomen is soft. There is no mass.     Tenderness: There is no abdominal tenderness. There is no right CVA tenderness, left CVA tenderness, guarding or rebound.  Musculoskeletal:        General: No swelling or tenderness. Normal range of motion.     Cervical back: Normal range of motion. No rigidity or tenderness.     Right lower leg: No edema.     Left lower leg: No edema.  Lymphadenopathy:     Cervical: No cervical adenopathy.  Skin:    General: Skin is warm and dry.     Coloration: Skin is not pale.     Findings: No bruising, erythema, lesion or rash.  Neurological:     Mental Status: She is alert. Mental status is  at baseline.     Cranial Nerves: No cranial nerve deficit.     Sensory: No sensory deficit.     Motor: No weakness.     Coordination: Coordination normal.     Gait: Gait abnormal.  Psychiatric:        Mood and Affect: Mood normal.        Speech: Speech normal.        Behavior: Behavior normal.         Thought Content: Thought content normal.        Cognition and Memory: Memory is impaired.        Judgment: Judgment normal.     Labs reviewed: Recent Labs    07/31/21 2125 11/04/21 0959 03/29/22 1259  NA 140 145 142  K 4.3 4.0 4.7  CL 107 111* 112*  CO2 _0 GLUCOSE 93 83 95  BUN 27* 17 25*  CREATININE 1.07* 1.03* 1.32*  CALCIUM 9.0 9.1 8.9   Recent Labs    07/31/21 2125 11/04/21 0959 03/29/22 1259  AST _1 ALT 17 22 34  ALKPHOS 72  --  70  BILITOT 0.6 0.4 0.7  PROT 7.0 6.3 6.3*  ALBUMIN 4.0  --  3.6   Recent Labs    11/04/21 0959 11/06/21 1138 03/29/22 1259  WBC 12.4* 9.7 6.8  NEUTROABS 9,399* 6,761 3.9  HGB 13.7 11.9 12.7  HCT 40.9 35.6 38.7  MCV 93.6 92.7 94.4  PLT 211 215 203   Lab Results  Component Value Date   TSH 0.84 11/04/2021   No results found for: "HGBA1C" Lab Results  Component Value Date   CHOL 124 11/04/2021   HDL 52 11/04/2021   LDLCALC 54 11/04/2021   TRIG 93 11/04/2021   CHOLHDL 2.4 11/04/2021    Significant Diagnostic Results in last 30 days:  No results found.  Assessment/Plan 1. Essential hypertension Blood pressure well controlled -Continue to monitor - CBC with Differential/Platelet - CMP with eGFR([facility]) - TSH  2. Hyperlipidemia LDL goal <100 LDL at goal -Continue on atorvastatin -Continue dietary modification and exercise as tolerated - Lipid Panel  3. Need for pneumococcal vaccination Pneumococcal 20 vaccine administered today by CMA no reaction reported.  - Pneumococcal conjugate vaccine 20-valent (Prevnar 20)  Family/ staff Communication: Reviewed plan of care with patient verbalized understanding  Labs/tests ordered:  - CBC with Differential/Platelet - CMP with eGFR([facility]) - TSH - Lipid Panel  Next Appointment : Return in about 6 months (around 11/13/2022) for medical mangement of chronic issues.Sandrea Hughs, NP

## 2022-05-15 LAB — COMPLETE METABOLIC PANEL WITH GFR
AG Ratio: 1.8 (calc) (ref 1.0–2.5)
ALT: 30 U/L — ABNORMAL HIGH (ref 6–29)
AST: 19 U/L (ref 10–35)
Albumin: 4.1 g/dL (ref 3.6–5.1)
Alkaline phosphatase (APISO): 81 U/L (ref 37–153)
BUN/Creatinine Ratio: 17 (calc) (ref 6–22)
BUN: 22 mg/dL (ref 7–25)
CO2: 24 mmol/L (ref 20–32)
Calcium: 9.4 mg/dL (ref 8.6–10.4)
Chloride: 110 mmol/L (ref 98–110)
Creat: 1.28 mg/dL — ABNORMAL HIGH (ref 0.60–0.95)
Globulin: 2.3 g/dL (calc) (ref 1.9–3.7)
Glucose, Bld: 82 mg/dL (ref 65–99)
Potassium: 4.6 mmol/L (ref 3.5–5.3)
Sodium: 144 mmol/L (ref 135–146)
Total Bilirubin: 0.6 mg/dL (ref 0.2–1.2)
Total Protein: 6.4 g/dL (ref 6.1–8.1)
eGFR: 42 mL/min/{1.73_m2} — ABNORMAL LOW (ref >=60)

## 2022-05-15 LAB — CBC WITH DIFFERENTIAL/PLATELET
Absolute Monocytes: 484 cells/uL (ref 200–950)
Basophils Absolute: 33 cells/uL (ref 0–200)
Basophils Relative: 0.4 %
Eosinophils Absolute: 74 cells/uL (ref 15–500)
Eosinophils Relative: 0.9 %
HCT: 42.8 % (ref 35.0–45.0)
Hemoglobin: 13.5 g/dL (ref 11.7–15.5)
Lymphs Abs: 2271 cells/uL (ref 850–3900)
MCH: 30.5 pg (ref 27.0–33.0)
MCHC: 31.5 g/dL — ABNORMAL LOW (ref 32.0–36.0)
MCV: 96.8 fL (ref 80.0–100.0)
MPV: 10.2 fL (ref 7.5–12.5)
Monocytes Relative: 5.9 %
Neutro Abs: 5338 cells/uL (ref 1500–7800)
Neutrophils Relative %: 65.1 %
Platelets: 212 10*3/uL (ref 140–400)
RBC: 4.42 10*6/uL (ref 3.80–5.10)
RDW: 12.1 % (ref 11.0–15.0)
Total Lymphocyte: 27.7 %
WBC: 8.2 10*3/uL (ref 3.8–10.8)

## 2022-05-15 LAB — LIPID PANEL
Cholesterol: 153 mg/dL (ref <200)
HDL: 59 mg/dL (ref >=50)
LDL Cholesterol (Calc): 77 mg/dL (calc)
Non-HDL Cholesterol (Calc): 94 mg/dL (calc) (ref <130)
Total CHOL/HDL Ratio: 2.6 (calc) (ref <5.0)
Triglycerides: 89 mg/dL (ref <150)

## 2022-05-15 LAB — TSH: TSH: 0.79 mIU/L (ref 0.40–4.50)

## 2022-07-02 DIAGNOSIS — G309 Alzheimer's disease, unspecified: Secondary | ICD-10-CM | POA: Diagnosis not present

## 2022-07-22 ENCOUNTER — Other Ambulatory Visit (HOSPITAL_BASED_OUTPATIENT_CLINIC_OR_DEPARTMENT_OTHER): Payer: Self-pay

## 2022-07-22 ENCOUNTER — Emergency Department (HOSPITAL_BASED_OUTPATIENT_CLINIC_OR_DEPARTMENT_OTHER): Payer: Medicare Other

## 2022-07-22 ENCOUNTER — Emergency Department (HOSPITAL_BASED_OUTPATIENT_CLINIC_OR_DEPARTMENT_OTHER)
Admission: EM | Admit: 2022-07-22 | Discharge: 2022-07-22 | Disposition: A | Discharge status: Home / Self Care | Payer: Medicare Other | Attending: Emergency Medicine | Admitting: Emergency Medicine

## 2022-07-22 ENCOUNTER — Other Ambulatory Visit: Payer: Self-pay

## 2022-07-22 ENCOUNTER — Encounter (HOSPITAL_BASED_OUTPATIENT_CLINIC_OR_DEPARTMENT_OTHER): Payer: Self-pay | Admitting: Pediatrics

## 2022-07-22 DIAGNOSIS — N390 Urinary tract infection, site not specified: Secondary | ICD-10-CM | POA: Diagnosis not present

## 2022-07-22 DIAGNOSIS — F0390 Unspecified dementia without behavioral disturbance: Secondary | ICD-10-CM | POA: Diagnosis not present

## 2022-07-22 DIAGNOSIS — Z20822 Contact with and (suspected) exposure to covid-19: Secondary | ICD-10-CM | POA: Diagnosis not present

## 2022-07-22 DIAGNOSIS — R531 Weakness: Secondary | ICD-10-CM

## 2022-07-22 DIAGNOSIS — M545 Low back pain, unspecified: Secondary | ICD-10-CM | POA: Diagnosis not present

## 2022-07-22 DIAGNOSIS — I10 Essential (primary) hypertension: Secondary | ICD-10-CM | POA: Diagnosis not present

## 2022-07-22 DIAGNOSIS — R5383 Other fatigue: Secondary | ICD-10-CM | POA: Diagnosis not present

## 2022-07-22 DIAGNOSIS — R918 Other nonspecific abnormal finding of lung field: Secondary | ICD-10-CM | POA: Diagnosis not present

## 2022-07-22 LAB — COMPREHENSIVE METABOLIC PANEL
ALT: 29 U/L (ref 0–44)
AST: 22 U/L (ref 15–41)
Albumin: 4 g/dL (ref 3.5–5.0)
Alkaline Phosphatase: 82 U/L (ref 38–126)
Anion gap: 6 (ref 5–15)
BUN: 26 mg/dL — ABNORMAL HIGH (ref 8–23)
CO2: 27 mmol/L (ref 22–32)
Calcium: 9.1 mg/dL (ref 8.9–10.3)
Chloride: 108 mmol/L (ref 98–111)
Creatinine, Ser: 1.36 mg/dL — ABNORMAL HIGH (ref 0.44–1.00)
GFR, Estimated: 39 mL/min — ABNORMAL LOW (ref >60)
Glucose, Bld: 90 mg/dL (ref 70–99)
Potassium: 4.9 mmol/L (ref 3.5–5.1)
Sodium: 141 mmol/L (ref 135–145)
Total Bilirubin: 0.7 mg/dL (ref 0.3–1.2)
Total Protein: 7.1 g/dL (ref 6.5–8.1)

## 2022-07-22 LAB — URINALYSIS, MICROSCOPIC (REFLEX)

## 2022-07-22 LAB — T4, FREE: Free T4: 0.83 ng/dL (ref 0.61–1.12)

## 2022-07-22 LAB — URINALYSIS, ROUTINE W REFLEX MICROSCOPIC
Bilirubin Urine: NEGATIVE
Glucose, UA: NEGATIVE mg/dL
Hgb urine dipstick: NEGATIVE
Ketones, ur: NEGATIVE mg/dL
Nitrite: POSITIVE — AB
Protein, ur: NEGATIVE mg/dL
Specific Gravity, Urine: 1.015 (ref 1.005–1.030)
pH: 7 (ref 5.0–8.0)

## 2022-07-22 LAB — CBC WITH DIFFERENTIAL/PLATELET
Abs Immature Granulocytes: 0.02 10*3/uL (ref 0.00–0.07)
Basophils Absolute: 0 10*3/uL (ref 0.0–0.1)
Basophils Relative: 0 %
Eosinophils Absolute: 0.1 10*3/uL (ref 0.0–0.5)
Eosinophils Relative: 1 %
HCT: 42.1 % (ref 36.0–46.0)
Hemoglobin: 14.1 g/dL (ref 12.0–15.0)
Immature Granulocytes: 0 %
Lymphocytes Relative: 30 %
Lymphs Abs: 2.5 10*3/uL (ref 0.7–4.0)
MCH: 31 pg (ref 26.0–34.0)
MCHC: 33.5 g/dL (ref 30.0–36.0)
MCV: 92.5 fL (ref 80.0–100.0)
Monocytes Absolute: 0.6 10*3/uL (ref 0.1–1.0)
Monocytes Relative: 7 %
Neutro Abs: 5.2 10*3/uL (ref 1.7–7.7)
Neutrophils Relative %: 62 %
Platelets: 208 10*3/uL (ref 150–400)
RBC: 4.55 MIL/uL (ref 3.87–5.11)
RDW: 12.3 % (ref 11.5–15.5)
WBC: 8.4 10*3/uL (ref 4.0–10.5)
nRBC: 0 % (ref 0.0–0.2)

## 2022-07-22 LAB — SARS CORONAVIRUS 2 BY RT PCR: SARS Coronavirus 2 by RT PCR: NEGATIVE

## 2022-07-22 LAB — TSH: TSH: 0.86 u[IU]/mL (ref 0.350–4.500)

## 2022-07-22 LAB — TROPONIN I (HIGH SENSITIVITY): Troponin I (High Sensitivity): 4 ng/L (ref <18)

## 2022-07-22 MED ORDER — LACTATED RINGERS IV BOLUS
1000.0000 mL | Freq: Once | INTRAVENOUS | Status: AC
  Administered 2022-07-22: 1000 mL via INTRAVENOUS

## 2022-07-22 MED ORDER — CEFDINIR 300 MG PO CAPS
300.0000 mg | ORAL_CAPSULE | Freq: Two times a day (BID) | ORAL | 0 refills | Status: AC
  Filled 2022-07-22: qty 14, 7d supply, fill #0

## 2022-07-22 NOTE — ED Provider Notes (Signed)
[facility] EMERGENCY DEPARTMENT Provider Note   CSN: [id number] Arrival date & time: 07/22/22  6010     History {Add pertinent medical, surgical, social history, OB history to HPI:1} Chief Complaint  Patient presents with   Fatigue    Christina Perkins is a 81 y.o. female.  HPI     Going downhill over the last week Seems sleepy, harder time keeping eyes open, walking slower Still answers questions the same  This week could barely move, said "I don't feel good"  Day before yesterday he couldn't even get her up, not even standing up, sleepy, can't keep eyes open Last week older sister went on cruise, came back and gave her a bath, found out she had COVID the day after that Yesterday had her urinate in a container-looked thick in container.  Usually when bring her she is dehydrated and has UTI, wipes front to back Lives alone, son takes care of her until 3PM, daughter comes at night  No energy She is eating well, if don't remind her won't drink but she is Complaining of back pain but thinks it is arthritis in hips Now generally weak, not necessarily pain preventing her Not complaining of pain with urination, no n/v/d, cp/dyspnea, cough.  Always runny nose for 30 years, no headache or falls. No new numbness (chronic bilat finger tips), no new weakness, no vision changes      Might not know date at baseline, sometimes gets family member names confused, normall walks with walker   Home Medications Prior to Admission medications   Medication Sig Start Date End Date Taking? Authorizing Provider  acetaminophen (TYLENOL) 500 MG tablet Take 2 tablets (1,000 mg total) by mouth in the morning and at bedtime. 07/27/20   Ngetich, Dinah C, NP  atorvastatin (LIPITOR) 10 MG tablet TAKE 1 TABLET BY MOUTH AT  BEDTIME 08/23/21   Ngetich, Dinah C, NP  cholecalciferol (VITAMIN D3) 25 MCG (1000 UNIT) tablet Take 1,000 Units by mouth daily.    [provider]  donepezil  (ARICEPT) 10 MG tablet Take 10 mg by mouth at bedtime. Before Bedtime.    [provider]  Ensure (ENSURE) Take 237 mLs by mouth daily.    [provider]  furosemide (LASIX) 20 MG tablet Take 1 tablet (20 mg total) by mouth daily. 03/29/22 03/29/23  Elson Areas, PA-C  meloxicam (MOBIC) 7.5 MG tablet Take 1 tablet (7.5 mg total) by mouth daily. 11/06/21   Ngetich, Dinah C, NP  memantine (NAMENDA) 10 MG tablet Take 10 mg by mouth 2 (two) times daily.    [provider]  mirtazapine (REMERON) 7.5 MG tablet TAKE 1 TABLET BY MOUTH AT  BEDTIME 09/20/21   Ngetich, Dinah C, NP  saccharomyces boulardii (FLORASTOR) 250 MG capsule Take 250 mg by mouth 2 (two) times daily. For 10 days    [provider]      Allergies    Patient has no known allergies.    Review of Systems   Review of Systems  Physical Exam Updated Vital Signs BP (!) 158/84 (BP Location: Left Arm)   Pulse 68   Temp 97.7 F (36.5 C) (Oral)   Resp 18   Ht 5' 7.5" (1.715 m)   Wt 66.7 kg   SpO2 99%   BMI 22.68 kg/m  Physical Exam Vitals and nursing note reviewed.  Constitutional:      General: She is not in acute distress.    Appearance: She is well-developed. She  is not diaphoretic.     Comments: Generally weak  HENT:     Head: Normocephalic and atraumatic.  Eyes:     Conjunctiva/sclera: Conjunctivae normal.  Cardiovascular:     Rate and Rhythm: Normal rate and regular rhythm.     Heart sounds: Normal heart sounds. No murmur heard.    No friction rub. No gallop.  Pulmonary:     Effort: Pulmonary effort is normal. No respiratory distress.     Breath sounds: Normal breath sounds. No wheezing or rales.  Abdominal:     General: There is no distension.     Palpations: Abdomen is soft.     Tenderness: There is no abdominal tenderness. There is no guarding.  Musculoskeletal:        General: Tenderness (right hip, low back) present.     Cervical back: Normal range of motion.  Skin:     General: Skin is warm and dry.     Findings: No erythema or rash.  Neurological:     General: No focal deficit present.     Mental Status: She is alert.     Sensory: No sensory deficit.     Motor: No weakness.     ED Results / Procedures / Treatments   Labs (all labs ordered are listed, but only abnormal results are displayed) Labs Reviewed  SARS CORONAVIRUS 2 BY RT PCR  CBC WITH DIFFERENTIAL/PLATELET  URINALYSIS, ROUTINE W REFLEX MICROSCOPIC  COMPREHENSIVE METABOLIC PANEL  TSH  T4, FREE  TROPONIN I (HIGH SENSITIVITY)    EKG None  Radiology No results found.  Procedures Procedures  {Document cardiac monitor, telemetry assessment procedure when appropriate:1}  Medications Ordered in ED Medications - No data to display  ED Course/ Medical Decision Making/ A&P                           Medical Decision Making Amount and/or Complexity of Data Reviewed Labs: ordered. Radiology: ordered.   81 year old female with a history of hypertension, avascular necrosis of the femoral head, back pain, dementia, hypertriglyceridemia, who presents with concern for generalized weakness.   Differential diagnosis includes anemia, electrolyte abnormality, cardiac abnormality, infection, hypothyroidism, other toxic/metabolic abnormalities.  No focal neurologic concerns on history or exam to suggest CVA, ICH or other central etiology.  Given she has had a change in mental status and sleepier, did order head CT which shows no evidence of acute abnormalities.   Chest x-ray shows no acute abnormalities, no pneumonia or pulmonary edema. Has back pain-XR shows degenerative changes. Hip pain is chronic, avascular necrosis, doubt septic arthritis. CBC showed no sign of anemia, no leukocytosis.  Electrolytes WNL, Cr at baseline.  EKG was not changed from prior, patient does not have chest pain, and troponin negative and have low suspicion for cardiac etiology of symptoms.  TSH ordered ***  Given IV  fluids with concern for dehydration.   UA shows ***    {Document critical care time when appropriate:1} {Document review of labs and clinical decision tools ie heart score, Chads2Vasc2 etc:1}  {Document your independent review of radiology images, and any outside records:1} {Document your discussion with family members, caretakers, and with consultants:1} {Document social determinants of health affecting pt's care:1} {Document your decision making why or why not admission, treatments were needed:1} Final Clinical Impression(s) / ED Diagnoses Final diagnoses:  None    Rx / DC Orders ED Discharge Orders     None

## 2022-07-22 NOTE — ED Triage Notes (Signed)
C/O increasing fatigue the last 2 weeks. Son at bedside report hx of depression and arthritis on bilateral hip.  Patient c/o lower back pain, denies any recent fall.

## 2022-07-26 ENCOUNTER — Other Ambulatory Visit: Payer: Self-pay | Admitting: Family

## 2022-07-26 DIAGNOSIS — E785 Hyperlipidemia, unspecified: Secondary | ICD-10-CM

## 2022-08-07 ENCOUNTER — Other Ambulatory Visit: Payer: Self-pay | Admitting: Family

## 2022-08-07 DIAGNOSIS — R634 Abnormal weight loss: Secondary | ICD-10-CM

## 2022-08-13 ENCOUNTER — Emergency Department (HOSPITAL_BASED_OUTPATIENT_CLINIC_OR_DEPARTMENT_OTHER): Payer: Medicare Other

## 2022-08-13 ENCOUNTER — Other Ambulatory Visit: Payer: Self-pay

## 2022-08-13 ENCOUNTER — Encounter (HOSPITAL_BASED_OUTPATIENT_CLINIC_OR_DEPARTMENT_OTHER): Payer: Self-pay | Admitting: Emergency Medicine

## 2022-08-13 ENCOUNTER — Encounter (HOSPITAL_COMMUNITY): Payer: Self-pay

## 2022-08-13 ENCOUNTER — Inpatient Hospital Stay (HOSPITAL_BASED_OUTPATIENT_CLINIC_OR_DEPARTMENT_OTHER)
Admission: EM | Admit: 2022-08-13 | Discharge: 2022-08-19 | DRG: 690 | Disposition: A | Discharge status: Skilled Nursing Facility | Payer: Medicare Other | Attending: Internal Medicine | Admitting: Internal Medicine

## 2022-08-13 DIAGNOSIS — Z8249 Family history of ischemic heart disease and other diseases of the circulatory system: Secondary | ICD-10-CM

## 2022-08-13 DIAGNOSIS — I10 Essential (primary) hypertension: Secondary | ICD-10-CM | POA: Diagnosis not present

## 2022-08-13 DIAGNOSIS — Z87440 Personal history of urinary (tract) infections: Secondary | ICD-10-CM

## 2022-08-13 DIAGNOSIS — G301 Alzheimer's disease with late onset: Secondary | ICD-10-CM | POA: Diagnosis present

## 2022-08-13 DIAGNOSIS — Z0389 Encounter for observation for other suspected diseases and conditions ruled out: Secondary | ICD-10-CM | POA: Diagnosis not present

## 2022-08-13 DIAGNOSIS — R410 Disorientation, unspecified: Secondary | ICD-10-CM | POA: Diagnosis not present

## 2022-08-13 DIAGNOSIS — Z87891 Personal history of nicotine dependence: Secondary | ICD-10-CM | POA: Diagnosis not present

## 2022-08-13 DIAGNOSIS — Z79899 Other long term (current) drug therapy: Secondary | ICD-10-CM

## 2022-08-13 DIAGNOSIS — R630 Anorexia: Secondary | ICD-10-CM | POA: Diagnosis present

## 2022-08-13 DIAGNOSIS — M6281 Muscle weakness (generalized): Secondary | ICD-10-CM | POA: Diagnosis not present

## 2022-08-13 DIAGNOSIS — Z820 Family history of epilepsy and other diseases of the nervous system: Secondary | ICD-10-CM

## 2022-08-13 DIAGNOSIS — N1832 Chronic kidney disease, stage 3b: Secondary | ICD-10-CM | POA: Diagnosis present

## 2022-08-13 DIAGNOSIS — B9620 Unspecified Escherichia coli [E. coli] as the cause of diseases classified elsewhere: Secondary | ICD-10-CM | POA: Diagnosis present

## 2022-08-13 DIAGNOSIS — R531 Weakness: Secondary | ICD-10-CM | POA: Diagnosis not present

## 2022-08-13 DIAGNOSIS — N3000 Acute cystitis without hematuria: Principal | ICD-10-CM | POA: Diagnosis present

## 2022-08-13 DIAGNOSIS — R627 Adult failure to thrive: Secondary | ICD-10-CM | POA: Diagnosis not present

## 2022-08-13 DIAGNOSIS — Z6824 Body mass index (BMI) 24.0-24.9, adult: Secondary | ICD-10-CM

## 2022-08-13 DIAGNOSIS — Z7401 Bed confinement status: Secondary | ICD-10-CM | POA: Diagnosis not present

## 2022-08-13 DIAGNOSIS — I6523 Occlusion and stenosis of bilateral carotid arteries: Secondary | ICD-10-CM | POA: Diagnosis present

## 2022-08-13 DIAGNOSIS — Z823 Family history of stroke: Secondary | ICD-10-CM

## 2022-08-13 DIAGNOSIS — F0280 Dementia in other diseases classified elsewhere without behavioral disturbance: Secondary | ICD-10-CM | POA: Diagnosis present

## 2022-08-13 DIAGNOSIS — N390 Urinary tract infection, site not specified: Secondary | ICD-10-CM | POA: Diagnosis not present

## 2022-08-13 DIAGNOSIS — I129 Hypertensive chronic kidney disease with stage 1 through stage 4 chronic kidney disease, or unspecified chronic kidney disease: Secondary | ICD-10-CM | POA: Diagnosis present

## 2022-08-13 DIAGNOSIS — R2689 Other abnormalities of gait and mobility: Secondary | ICD-10-CM | POA: Diagnosis not present

## 2022-08-13 DIAGNOSIS — E782 Mixed hyperlipidemia: Secondary | ICD-10-CM | POA: Diagnosis present

## 2022-08-13 DIAGNOSIS — Z743 Need for continuous supervision: Secondary | ICD-10-CM | POA: Diagnosis not present

## 2022-08-13 DIAGNOSIS — Z8719 Personal history of other diseases of the digestive system: Secondary | ICD-10-CM | POA: Diagnosis not present

## 2022-08-13 DIAGNOSIS — Z791 Long term (current) use of non-steroidal anti-inflammatories (NSAID): Secondary | ICD-10-CM | POA: Diagnosis not present

## 2022-08-13 DIAGNOSIS — Z821 Family history of blindness and visual loss: Secondary | ICD-10-CM | POA: Diagnosis not present

## 2022-08-13 DIAGNOSIS — Z8673 Personal history of transient ischemic attack (TIA), and cerebral infarction without residual deficits: Secondary | ICD-10-CM | POA: Diagnosis not present

## 2022-08-13 DIAGNOSIS — R1310 Dysphagia, unspecified: Secondary | ICD-10-CM | POA: Diagnosis not present

## 2022-08-13 DIAGNOSIS — E559 Vitamin D deficiency, unspecified: Secondary | ICD-10-CM | POA: Diagnosis not present

## 2022-08-13 LAB — COMPREHENSIVE METABOLIC PANEL
ALT: 24 U/L (ref 0–44)
AST: 28 U/L (ref 15–41)
Albumin: 3.5 g/dL (ref 3.5–5.0)
Alkaline Phosphatase: 78 U/L (ref 38–126)
Anion gap: 5 (ref 5–15)
BUN: 28 mg/dL — ABNORMAL HIGH (ref 8–23)
CO2: 23 mmol/L (ref 22–32)
Calcium: 8.7 mg/dL — ABNORMAL LOW (ref 8.9–10.3)
Chloride: 114 mmol/L — ABNORMAL HIGH (ref 98–111)
Creatinine, Ser: 1.47 mg/dL — ABNORMAL HIGH (ref 0.44–1.00)
GFR, Estimated: 35 mL/min — ABNORMAL LOW (ref >60)
Glucose, Bld: 101 mg/dL — ABNORMAL HIGH (ref 70–99)
Potassium: 4.2 mmol/L (ref 3.5–5.1)
Sodium: 142 mmol/L (ref 135–145)
Total Bilirubin: 0.6 mg/dL (ref 0.3–1.2)
Total Protein: 6.4 g/dL — ABNORMAL LOW (ref 6.5–8.1)

## 2022-08-13 LAB — URINALYSIS, ROUTINE W REFLEX MICROSCOPIC
Bilirubin Urine: NEGATIVE
Glucose, UA: NEGATIVE mg/dL
Ketones, ur: NEGATIVE mg/dL
Nitrite: POSITIVE — AB
Protein, ur: NEGATIVE mg/dL
Specific Gravity, Urine: 1.025 (ref 1.005–1.030)
pH: 5.5 (ref 5.0–8.0)

## 2022-08-13 LAB — CBC WITH DIFFERENTIAL/PLATELET
Abs Immature Granulocytes: 0.02 10*3/uL (ref 0.00–0.07)
Basophils Absolute: 0 10*3/uL (ref 0.0–0.1)
Basophils Relative: 0 %
Eosinophils Absolute: 0.1 10*3/uL (ref 0.0–0.5)
Eosinophils Relative: 1 %
HCT: 38.5 % (ref 36.0–46.0)
Hemoglobin: 12.8 g/dL (ref 12.0–15.0)
Immature Granulocytes: 0 %
Lymphocytes Relative: 22 %
Lymphs Abs: 2 10*3/uL (ref 0.7–4.0)
MCH: 30.3 pg (ref 26.0–34.0)
MCHC: 33.2 g/dL (ref 30.0–36.0)
MCV: 91 fL (ref 80.0–100.0)
Monocytes Absolute: 0.7 10*3/uL (ref 0.1–1.0)
Monocytes Relative: 8 %
Neutro Abs: 6 10*3/uL (ref 1.7–7.7)
Neutrophils Relative %: 69 %
Platelets: 184 10*3/uL (ref 150–400)
RBC: 4.23 MIL/uL (ref 3.87–5.11)
RDW: 12.1 % (ref 11.5–15.5)
WBC: 8.7 10*3/uL (ref 4.0–10.5)
nRBC: 0 % (ref 0.0–0.2)

## 2022-08-13 LAB — URINALYSIS, MICROSCOPIC (REFLEX)

## 2022-08-13 LAB — TROPONIN I (HIGH SENSITIVITY): Troponin I (High Sensitivity): 8 ng/L (ref <18)

## 2022-08-13 MED ORDER — DONEPEZIL HCL 10 MG PO TABS
10.0000 mg | ORAL_TABLET | Freq: Every day | ORAL | Status: DC
  Administered 2022-08-13 – 2022-08-19 (×6): 10 mg via ORAL
  Filled 2022-08-13 (×6): qty 1

## 2022-08-13 MED ORDER — FUROSEMIDE 20 MG PO TABS
20.0000 mg | ORAL_TABLET | Freq: Every day | ORAL | Status: DC
  Administered 2022-08-13: 20 mg via ORAL
  Filled 2022-08-13: qty 1

## 2022-08-13 MED ORDER — MIRTAZAPINE 7.5 MG PO TABS
7.5000 mg | ORAL_TABLET | Freq: Every day | ORAL | Status: DC
  Administered 2022-08-13 – 2022-08-19 (×6): 7.5 mg via ORAL
  Filled 2022-08-13 (×6): qty 1

## 2022-08-13 MED ORDER — LABETALOL HCL 100 MG PO TABS
100.0000 mg | ORAL_TABLET | Freq: Two times a day (BID) | ORAL | Status: DC
  Administered 2022-08-13 – 2022-08-19 (×12): 100 mg via ORAL
  Filled 2022-08-13 (×12): qty 1

## 2022-08-13 MED ORDER — SODIUM CHLORIDE 0.9 % IV SOLN
1.0000 g | INTRAVENOUS | Status: AC
  Administered 2022-08-14 – 2022-08-18 (×5): 1 g via INTRAVENOUS
  Filled 2022-08-13 (×5): qty 10

## 2022-08-13 MED ORDER — SODIUM CHLORIDE 0.9 % IV SOLN
1.0000 g | Freq: Once | INTRAVENOUS | Status: AC
  Administered 2022-08-13: 1 g via INTRAVENOUS
  Filled 2022-08-13: qty 10

## 2022-08-13 MED ORDER — ENSURE ENLIVE PO LIQD
237.0000 mL | Freq: Every day | ORAL | Status: DC
  Administered 2022-08-15 – 2022-08-19 (×5): 237 mL via ORAL

## 2022-08-13 MED ORDER — CLONIDINE HCL 0.1 MG PO TABS
0.1000 mg | ORAL_TABLET | ORAL | Status: DC | PRN
  Administered 2022-08-14: 0.1 mg via ORAL
  Filled 2022-08-13: qty 1

## 2022-08-13 MED ORDER — ACETAMINOPHEN 650 MG RE SUPP: 650.0000 mg | Freq: Four times a day (QID) | RECTAL | Status: DC | PRN

## 2022-08-13 MED ORDER — ONDANSETRON HCL 4 MG/2ML IJ SOLN: 4.0000 mg | Freq: Four times a day (QID) | INTRAMUSCULAR | Status: DC | PRN

## 2022-08-13 MED ORDER — ONDANSETRON HCL 4 MG PO TABS: 4.0000 mg | ORAL_TABLET | Freq: Four times a day (QID) | ORAL | Status: DC | PRN

## 2022-08-13 MED ORDER — MEMANTINE HCL 10 MG PO TABS
10.0000 mg | ORAL_TABLET | Freq: Two times a day (BID) | ORAL | Status: DC
  Administered 2022-08-13 – 2022-08-19 (×12): 10 mg via ORAL
  Filled 2022-08-13 (×12): qty 1

## 2022-08-13 MED ORDER — MELATONIN 5 MG PO TABS: 10.0000 mg | ORAL_TABLET | Freq: Every evening | ORAL | Status: DC | PRN

## 2022-08-13 MED ORDER — VITAMIN D 25 MCG (1000 UNIT) PO TABS
1000.0000 [IU] | ORAL_TABLET | Freq: Every day | ORAL | Status: DC
  Administered 2022-08-13 – 2022-08-19 (×7): 1000 [IU] via ORAL
  Filled 2022-08-13 (×7): qty 1

## 2022-08-13 MED ORDER — ACETAMINOPHEN 325 MG PO TABS
650.0000 mg | ORAL_TABLET | Freq: Four times a day (QID) | ORAL | Status: DC | PRN
  Administered 2022-08-16 – 2022-08-19 (×5): 650 mg via ORAL
  Filled 2022-08-13 (×5): qty 2

## 2022-08-13 MED ORDER — HEPARIN SODIUM (PORCINE) 5000 UNIT/ML IJ SOLN
5000.0000 [IU] | Freq: Three times a day (TID) | INTRAMUSCULAR | Status: DC
  Administered 2022-08-13 – 2022-08-19 (×17): 5000 [IU] via SUBCUTANEOUS
  Filled 2022-08-13 (×17): qty 1

## 2022-08-13 MED ORDER — ATORVASTATIN CALCIUM 10 MG PO TABS
10.0000 mg | ORAL_TABLET | Freq: Every day | ORAL | Status: DC
  Administered 2022-08-13 – 2022-08-19 (×6): 10 mg via ORAL
  Filled 2022-08-13 (×6): qty 1

## 2022-08-13 MED ORDER — LACTATED RINGERS IV BOLUS
1000.0000 mL | Freq: Once | INTRAVENOUS | Status: AC
  Administered 2022-08-13: 1000 mL via INTRAVENOUS

## 2022-08-13 NOTE — Assessment & Plan Note (Addendum)
Observation medical bed. IV rocephin 1 gram qday.. Urine cx obtained in ER. Repeat CBC in AM.

## 2022-08-13 NOTE — Assessment & Plan Note (Signed)
Currently not on maintenance HTN meds as outpatient. Start low dose labetalol.

## 2022-08-13 NOTE — ED Notes (Signed)
Son Abe People) left phone number [phone]

## 2022-08-13 NOTE — H&P (Signed)
History and Physical    Christina Perkins ZOX:[id number] DOB: 05-11-1941 DOA: 08/13/2022  DOS: the patient was seen and examined on 08/13/2022  PCP: Ngetich, Nelda Bucks, NP   Patient coming from: Home  I have personally briefly reviewed patient's old medical records in [place]  CC: weakness HPI: 81 year old African-American female history of hypertension, CKD stage IIIb, Alzheimer type dementia presents to the ER today with weakness.  Patient brought in by her son.  Per the note, patient had weakness for the past week.  Was being treated for UTI.  Son is not available for interview.  Attempted to call him at [phone].  There was no answer.  No reported fever, chills, nausea, vomiting.  Laboratory evaluation in the ER showed urinalysis with positive nitrates, leukocyte esterase, bacteria, 21-50 WBCs.  White count 8.7, hemoglobin 12.8, platelets 184  BUN of 28, creatinine 1.47  Baseline creatinine approximately 1.3-1.4  [facility] contacted for admission.  Per her last outpatient visit with [facility] on May 14, 2022, she remained a full code.   ED Course: UA shows +nitrites, LE, WBC  Review of Systems:  Review of Systems  Unable to perform ROS: Dementia    Past Medical History:  Diagnosis Date   Abnormal chest x-ray    Abnormal results of liver function studies    Abnormal thyroid screen (blood)    Acute sinusitis    Alcohol screening    Anemia, deficiency    Arthropathy    BMI 22.0-22.9, adult    Carotid stenosis, bilateral    Carotid stenosis, bilateral    Cellulitis    Chronic kidney disease, stage III (moderate) (HCC)    Closed fracture of sacrum and coccyx, with routine healing, subsequent encounter    Colon cancer screening    Colon polyp    Dementia ([place])    Dizzy spells    Encounter for screening mammogram for breast cancer    Facet arthropathy, lumbar    Fatty pancreas    Generalized abdominal or pelvic swelling or mass or  lump    Glaucoma screening    H/O mammogram 2019   Per [facility] new patient packet   Hepatomegalia    High risk medication use    Hospital discharge follow-up    Hx of colonoscopy 2016   Per [facility] new patient packet   Hypertension    Hypertriglyceridemia    Influenza vaccination declined by patient    Low back pain    Lumbar disc disease with radiculopathy    Lung nodule    Magnesium deficiency    Medical non-compliance    Menopause    Microscopic hematuria    Mixed dyslipidemia    Neurodegenerative cognitive impairment ([place])    Osteoarthritis, hip, bilateral    Overweight    Personal history of smoking    Plantar fasciitis    Poor appetite    Refusal of influenza vaccine by provider    Renal cyst, left    Right atrial enlargement    Right hip pain    Sciatica of right side    Screening for depression    Screening for HIV (human immunodeficiency virus)    Screening for STDs (sexually transmitted diseases)    Senile dementia without behavioral disturbance (HCC)    Shortness of breath    Spondylolisthesis, grade 1    Unintentional weight loss    URI, acute    Urinary frequency    UTI (urinary tract infection), bacterial  Vitamin D deficiency     History reviewed. No pertinent surgical history.   reports that she has quit smoking. She has never used smokeless tobacco. She reports that she does not currently use alcohol. She reports that she does not use drugs.  No Known Allergies  Family History  Problem Relation Age of Onset   Dementia Mother    Alzheimer's disease Mother    Hypertension Mother    Dementia Father    Blindness Father    Hypertension Father    Stroke Father     Prior to Admission medications   Medication Sig Start Date End Date Taking? Authorizing Provider  acetaminophen (TYLENOL) 500 MG tablet Take 2 tablets (1,000 mg total) by mouth in the morning and at bedtime. 07/27/20   Ngetich, Dinah C, NP  atorvastatin (LIPITOR) 10 MG tablet TAKE 1 TABLET  BY MOUTH AT  BEDTIME 07/28/22   Ngetich, Dinah C, NP  cholecalciferol (VITAMIN D3) 25 MCG (1000 UNIT) tablet Take 1,000 Units by mouth daily.    [provider]  donepezil (ARICEPT) 10 MG tablet Take 10 mg by mouth at bedtime. Before Bedtime.    [provider]  Ensure (ENSURE) Take 237 mLs by mouth daily.    [provider]  furosemide (LASIX) 20 MG tablet Take 1 tablet (20 mg total) by mouth daily. 03/29/22 03/29/23  Fransico Meadow, PA-C  meloxicam (MOBIC) 7.5 MG tablet Take 1 tablet (7.5 mg total) by mouth daily. 11/06/21   Ngetich, Dinah C, NP  memantine (NAMENDA) 10 MG tablet Take 10 mg by mouth 2 (two) times daily.    [provider]  mirtazapine (REMERON) 7.5 MG tablet TAKE 1 TABLET BY MOUTH AT  BEDTIME 08/08/22   Ngetich, Dinah C, NP  saccharomyces boulardii (FLORASTOR) 250 MG capsule Take 250 mg by mouth 2 (two) times daily. For 10 days    [provider]    Physical Exam: Vitals:   08/13/22 1817 08/13/22 1830 08/13/22 2000 08/13/22 2016  BP:  (!) 169/86 (!) 178/88 (!) 178/87  Pulse: 65 65 67 65  Resp: 19 17 16 18   Temp: (!) 97.4 F (36.3 C)   98.2 F (36.8 C)  TempSrc: Oral   Oral  SpO2: 100% 97% 99% 99%  Weight:        Physical Exam Vitals and nursing note reviewed.  Constitutional:      General: She is not in acute distress.    Appearance: She is normal weight. She is not ill-appearing, toxic-appearing or diaphoretic.  HENT:     Head: Normocephalic and atraumatic.     Nose: Nose normal.  Eyes:     General: No scleral icterus. Cardiovascular:     Rate and Rhythm: Normal rate and regular rhythm.     Pulses: Normal pulses.  Pulmonary:     Effort: Pulmonary effort is normal. No respiratory distress.     Breath sounds: Normal breath sounds. No wheezing or rales.  Abdominal:     General: Abdomen is flat. Bowel sounds are normal. There is no distension.     Palpations: Abdomen is soft.     Tenderness: There is no abdominal  tenderness. There is no guarding or rebound.  Musculoskeletal:     Right lower leg: No edema.     Left lower leg: No edema.  Skin:    General: Skin is warm and dry.     Capillary Refill: Capillary refill takes less than 2 seconds.  Neurological:  Mental Status: She is alert. She is disoriented.     Comments: Only oriented to person. QN:[id number]), month(unknown).      Labs on Admission: I have personally reviewed following labs and imaging studies  CBC: Recent Labs  Lab 08/13/22 0803  WBC 8.7  NEUTROABS 6.0  HGB 12.8  HCT 38.5  MCV 91.0  PLT [id number]   Basic Metabolic Panel: Recent Labs  Lab 08/13/22 0803  NA 142  K 4.2  CL 114*  CO2 23  GLUCOSE 101*  BUN 28*  CREATININE 1.47*  CALCIUM 8.7*   GFR: Estimated Creatinine Clearance: 29.3 mL/min (A) (by C-G formula based on SCr of 1.47 mg/dL (H)). Liver Function Tests: Recent Labs  Lab 08/13/22 0803  AST 28  ALT 24  ALKPHOS 78  BILITOT 0.6  PROT 6.4*  ALBUMIN 3.5   No results for input(s): "LIPASE", "AMYLASE" in the last 168 hours. No results for input(s): "AMMONIA" in the last 168 hours. Coagulation Profile: No results for input(s): "INR", "PROTIME" in the last 168 hours. Cardiac Enzymes: Recent Labs  Lab 08/13/22 0803  TROPONINIHS 8   BNP (last 3 results) No results for input(s): "PROBNP" in the last 8760 hours. HbA1C: No results for input(s): "HGBA1C" in the last 72 hours. CBG: No results for input(s): "GLUCAP" in the last 168 hours. Lipid Profile: No results for input(s): "CHOL", "HDL", "LDLCALC", "TRIG", "CHOLHDL", "LDLDIRECT" in the last 72 hours. Thyroid Function Tests: No results for input(s): "TSH", "T4TOTAL", "FREET4", "T3FREE", "THYROIDAB" in the last 72 hours. Anemia Panel: No results for input(s): "VITAMINB12", "FOLATE", "FERRITIN", "TIBC", "IRON", "RETICCTPCT" in the last 72 hours. Urine analysis:    Component Value Date/Time   COLORURINE YELLOW 08/13/2022 1024   APPEARANCEUR CLEAR  08/13/2022 1024   LABSPEC 1.025 08/13/2022 1024   PHURINE 5.5 08/13/2022 1024   GLUCOSEU NEGATIVE 08/13/2022 1024   HGBUR TRACE (A) 08/13/2022 1024   BILIRUBINUR NEGATIVE 08/13/2022 1024   BILIRUBINUR negative 11/06/2021 1150   KETONESUR NEGATIVE 08/13/2022 1024   PROTEINUR NEGATIVE 08/13/2022 1024   UROBILINOGEN negative (A) 11/06/2021 1150   NITRITE POSITIVE (A) 08/13/2022 1024   LEUKOCYTESUR SMALL (A) 08/13/2022 1024    Radiological Exams on Admission: I have personally reviewed images CT Head Wo Contrast  Result Date: 08/13/2022 CLINICAL DATA:  Delirium.  Weakness. EXAM: CT HEAD WITHOUT CONTRAST TECHNIQUE: Contiguous axial images were obtained from the base of the skull through the vertex without intravenous contrast. RADIATION DOSE REDUCTION: This exam was performed according to the departmental dose-optimization program which includes automated exposure control, adjustment of the mA and/or kV according to patient size and/or use of iterative reconstruction technique. COMPARISON:  None Available. FINDINGS: Brain: No acute intracranial hemorrhage. No focal mass lesion. No CT evidence of acute infarction. No midline shift or mass effect. No hydrocephalus. Basilar cisterns are patent. There are periventricular and subcortical white matter hypodensities. Generalized cortical atrophy. Vascular: No hyperdense vessel or unexpected calcification. Skull: Normal. Negative for fracture or focal lesion. Sinuses/Orbits: Paranasal sinuses and mastoid air cells are clear. Orbits are clear. Other: None. IMPRESSION: 1. No acute intracranial findings. 2. Atrophy and white matter microvascular disease. Electronically Signed   By: Suzy Bouchard M.D.   On: 08/13/2022 09:05    EKG: My personal interpretation of EKG shows: NSR    Assessment/Plan Principal Problem:   Acute cystitis without hematuria Active Problems:   Generalized weakness   Essential hypertension   Late onset Alzheimer's dementia  without behavioral disturbance (HCC)   Stage 3b  chronic kidney disease (CKD) (HCC)    Assessment and Plan: * Acute cystitis without hematuria Observation medical bed. IV rocephin 1 gram qday.. Urine cx obtained in ER. Repeat CBC in AM.  Stage 3b chronic kidney disease (CKD) (HCC) Stable. Will hold lasix.  Late onset Alzheimer's dementia without behavioral disturbance (HCC) Chronic. Follows with outpatient neurology in [facility].  Essential hypertension Currently not on maintenance HTN meds as outpatient. Start low dose labetalol.  Generalized weakness PT consult.   DVT prophylaxis: SQ Heparin Code Status: Full Code by default Family Communication: no family at bedside. Attempted to call pt's son Christina Perkins [phone]. No answer. Disposition Plan: return home  Consults called: none  Admission status: Observation, Med-Surg   Kristopher Oppenheim, DO [facility] 08/13/2022, 9:36 PM

## 2022-08-13 NOTE — Subjective & Objective (Signed)
CC: weakness HPI: 81 year old African-American female history of hypertension, CKD stage IIIb, Alzheimer type dementia presents to the ER today with weakness.  Patient brought in by her son.  Per the note, patient had weakness for the past week.  Was being treated for UTI.  Son is not available for interview.  Attempted to call him at [phone].  There was no answer.  No reported fever, chills, nausea, vomiting.  Laboratory evaluation in the ER showed urinalysis with positive nitrates, leukocyte esterase, bacteria, 21-50 WBCs.  White count 8.7, hemoglobin 12.8, platelets 184  BUN of 28, creatinine 1.47  Baseline creatinine approximately 1.3-1.4  [facility] contacted for admission.  Per her last outpatient visit with [facility] on May 14, 2022, she remained a full code.

## 2022-08-13 NOTE — Assessment & Plan Note (Signed)
Stable. Will hold lasix.

## 2022-08-13 NOTE — Assessment & Plan Note (Signed)
Chronic. Follows with outpatient neurology in [facility]

## 2022-08-13 NOTE — ED Notes (Signed)
ED Provider at bedside. 

## 2022-08-13 NOTE — Plan of Care (Signed)
Plan of Care Note for accepted transfer   Patient: Christina Perkins MRN: [id number]   [place]ometown: 08/13/2022  Facility requesting transfer: [facility] Requesting Provider: Dr. Matilde Sprang Reason for transfer: UTI Facility course:  [name] is an 81 yo female with PMH dementia, anemia, HLD, recurrent UTI, carotid stenosis, CKD 3 who was brought in by her son from home for ongoing/worsening weakness.  She was recently treated for UTI earlier in September with [facility]. UA revealed positive nitrite, small LE, 21-50 WBC, many bacteria.  She was given a dose of Rocephin. Due to weakness, she is recommended for observation and further UTI treatment.  PT evaluation may be needed as well.  Plan of care: The patient is accepted for admission to [place]  unit, at [facility].  Author: Dwyane Dee, MD 08/13/2022  Check [email or web address] for on-call coverage.  Nursing staff, Please call [place] number on Amion as soon as patient's arrival, so appropriate admitting provider can evaluate the pt.

## 2022-08-13 NOTE — Assessment & Plan Note (Signed)
-  PT consulted, recommends home PT 

## 2022-08-13 NOTE — ED Triage Notes (Signed)
Son reports pt has had weakness for the past week. Recently treated for a UTI. Son says pt was acting the same 2 weeks ago and after being treated for the UTI, symptoms went away. Hx of dementia

## 2022-08-13 NOTE — ED Provider Notes (Signed)
[facility] EMERGENCY DEPARTMENT Provider Note  CSN: [id number] Arrival date & time: 08/13/22 [id number]  Chief Complaint(s) Weakness  HPI Christina Perkins is a 81 y.o. female with PMH CKD 3, carotid stenosis, dementia, HTN, HLD, recurrent UTIs who presents emergency department for evaluation of fatigue, generalized weakness and dysuria.  Patient arrives with her son who states that she has had a significant decline in in her functional status in the last 1 week and this presentation is consistent with her previous presentations when she has symptomatic UTIs.  Son is concerned that the patient may be improperly wiping back to front.  Denies fever, chest pain, shortness of breath, abdominal pain, headache or other systemic symptoms.   Past Medical History Past Medical History:  Diagnosis Date   Abnormal chest x-ray    Abnormal results of liver function studies    Abnormal thyroid screen (blood)    Acute sinusitis    Alcohol screening    Anemia, deficiency    Arthropathy    BMI 22.0-22.9, adult    Carotid stenosis, bilateral    Carotid stenosis, bilateral    Cellulitis    Chronic kidney disease, stage III (moderate) (HCC)    Closed fracture of sacrum and coccyx, with routine healing, subsequent encounter    Colon cancer screening    Colon polyp    Dementia (HCC)    Dizzy spells    Encounter for screening mammogram for breast cancer    Facet arthropathy, lumbar    Fatty pancreas    Generalized abdominal or pelvic swelling or mass or lump    Glaucoma screening    H/O mammogram 2019   Per [facility] new patient packet   Hepatomegalia    High risk medication use    Hospital discharge follow-up    Hx of colonoscopy 2016   Per [facility] new patient packet   Hypertension    Hypertriglyceridemia    Influenza vaccination declined by patient    Low back pain    Lumbar disc disease with radiculopathy    Lung nodule    Magnesium deficiency    Medical non-compliance    Menopause     Microscopic hematuria    Mixed dyslipidemia    Neurodegenerative cognitive impairment (HCC)    Osteoarthritis, hip, bilateral    Overweight    Personal history of smoking    Plantar fasciitis    Poor appetite    Refusal of influenza vaccine by provider    Renal cyst, left    Right atrial enlargement    Right hip pain    Sciatica of right side    Screening for depression    Screening for HIV (human immunodeficiency virus)    Screening for STDs (sexually transmitted diseases)    Senile dementia without behavioral disturbance (HCC)    Shortness of breath    Spondylolisthesis, grade 1    Unintentional weight loss    URI, acute    Urinary frequency    UTI (urinary tract infection), bacterial    Vitamin D deficiency    Patient Active Problem List   Diagnosis Date Noted   Acute bilateral low back pain without sciatica 11/08/2020   Malnutrition of moderate degree 07/19/2020   Weakness 07/18/2020   Generalized weakness 07/18/2020   Acute lower UTI 07/18/2020   Fall at home, initial encounter 07/18/2020   Hypokalemia 07/18/2020   Avascular necrosis of femoral head (HCC) 07/18/2020   Essential hypertension 07/18/2020   Injury of toe on right foot 11/07/2013  Edema of toe 11/07/2013   Pain in toe of right foot 11/07/2013   Home Medication(s) Prior to Admission medications   Medication Sig Start Date End Date Taking? Authorizing Provider  acetaminophen (TYLENOL) 500 MG tablet Take 2 tablets (1,000 mg total) by mouth in the morning and at bedtime. 07/27/20   Ngetich, Dinah C, NP  atorvastatin (LIPITOR) 10 MG tablet TAKE 1 TABLET BY MOUTH AT  BEDTIME 07/28/22   Ngetich, Dinah C, NP  cholecalciferol (VITAMIN D3) 25 MCG (1000 UNIT) tablet Take 1,000 Units by mouth daily.    [provider]  donepezil (ARICEPT) 10 MG tablet Take 10 mg by mouth at bedtime. Before Bedtime.    [provider]  Ensure (ENSURE) Take 237 mLs by mouth daily.    [provider]   furosemide (LASIX) 20 MG tablet Take 1 tablet (20 mg total) by mouth daily. 03/29/22 03/29/23  Fransico Meadow, PA-C  meloxicam (MOBIC) 7.5 MG tablet Take 1 tablet (7.5 mg total) by mouth daily. 11/06/21   Ngetich, Dinah C, NP  memantine (NAMENDA) 10 MG tablet Take 10 mg by mouth 2 (two) times daily.    [provider]  mirtazapine (REMERON) 7.5 MG tablet TAKE 1 TABLET BY MOUTH AT  BEDTIME 08/08/22   Ngetich, Dinah C, NP  saccharomyces boulardii (FLORASTOR) 250 MG capsule Take 250 mg by mouth 2 (two) times daily. For 10 days    [provider]                                                                                                                                    Past Surgical History History reviewed. No pertinent surgical history. Family History Family History  Problem Relation Age of Onset   Dementia Mother    Alzheimer's disease Mother    Hypertension Mother    Dementia Father    Blindness Father    Hypertension Father    Stroke Father     Social History Social History   Tobacco Use   Smoking status: Former   Smokeless tobacco: Never   Tobacco comments:    Quit at about age 30   Vaping Use   Vaping Use: Never used  Substance Use Topics   Alcohol use: Not Currently    Comment: 6 glasses of wine a week.   Drug use: No   Allergies Patient has no known allergies.  Review of Systems Review of Systems  Constitutional:  Positive for fatigue.  Psychiatric/Behavioral:  Positive for confusion.     Physical Exam Vital Signs  I have reviewed the triage vital signs BP (!) 186/83   Pulse 62   Temp 97.8 F (36.6 C) (Oral)   Resp 17   Wt 71.7 kg   SpO2 97%   BMI 24.40 kg/m   Physical Exam Vitals and nursing note reviewed.  Constitutional:      General: She is not in  acute distress.    Appearance: She is well-developed.  HENT:     Head: Normocephalic and atraumatic.  Eyes:     Conjunctiva/sclera: Conjunctivae normal.  Cardiovascular:      Rate and Rhythm: Normal rate and regular rhythm.     Heart sounds: No murmur heard. Pulmonary:     Effort: Pulmonary effort is normal. No respiratory distress.     Breath sounds: Normal breath sounds.  Abdominal:     Palpations: Abdomen is soft.     Tenderness: There is no abdominal tenderness.  Musculoskeletal:        General: No swelling.     Cervical back: Neck supple.  Skin:    General: Skin is warm and dry.     Capillary Refill: Capillary refill takes less than 2 seconds.  Neurological:     Mental Status: She is alert. She is disoriented.  Psychiatric:        Mood and Affect: Mood normal.     ED Results and Treatments Labs (all labs ordered are listed, but only abnormal results are displayed) Labs Reviewed  COMPREHENSIVE METABOLIC PANEL - Abnormal; Notable for the following components:      Result Value   Chloride 114 (*)    Glucose, Bld 101 (*)    BUN 28 (*)    Creatinine, Ser 1.47 (*)    Calcium 8.7 (*)    Total Protein 6.4 (*)    GFR, Estimated 35 (*)    All other components within normal limits  URINALYSIS, ROUTINE W REFLEX MICROSCOPIC - Abnormal; Notable for the following components:   Hgb urine dipstick TRACE (*)    Nitrite POSITIVE (*)    Leukocytes,Ua SMALL (*)    All other components within normal limits  URINALYSIS, MICROSCOPIC (REFLEX) - Abnormal; Notable for the following components:   Bacteria, UA MANY (*)    All other components within normal limits  URINE CULTURE  CBC WITH DIFFERENTIAL/PLATELET  TROPONIN I (HIGH SENSITIVITY)                                                                                                                          Radiology CT Head Wo Contrast  Result Date: 08/13/2022 CLINICAL DATA:  Delirium.  Weakness. EXAM: CT HEAD WITHOUT CONTRAST TECHNIQUE: Contiguous axial images were obtained from the base of the skull through the vertex without intravenous contrast. RADIATION DOSE REDUCTION: This exam was performed according  to the departmental dose-optimization program which includes automated exposure control, adjustment of the mA and/or kV according to patient size and/or use of iterative reconstruction technique. COMPARISON:  None Available. FINDINGS: Brain: No acute intracranial hemorrhage. No focal mass lesion. No CT evidence of acute infarction. No midline shift or mass effect. No hydrocephalus. Basilar cisterns are patent. There are periventricular and subcortical white matter hypodensities. Generalized cortical atrophy. Vascular: No hyperdense vessel or unexpected calcification. Skull: Normal. Negative for fracture or focal lesion. Sinuses/Orbits: Paranasal sinuses and mastoid air cells are clear.  Orbits are clear. Other: None. IMPRESSION: 1. No acute intracranial findings. 2. Atrophy and white matter microvascular disease. Electronically Signed   By: Suzy Bouchard M.D.   On: 08/13/2022 09:05    Pertinent labs & imaging results that were available during my care of the patient were reviewed by me and considered in my medical decision making (see MDM for details).  Medications Ordered in ED Medications  cefTRIAXone (ROCEPHIN) 1 g in sodium chloride 0.9 % 100 mL IVPB (has no administration in time range)  lactated ringers bolus 1,000 mL (0 mLs Intravenous Stopped 08/13/22 1054)                                                                                                                                     Procedures Procedures  (including critical care time)  Medical Decision Making / ED Course   This patient presents to the ED for concern of confusion, fatigue, this involves an extensive number of treatment options, and is a complaint that carries with it a high risk of complications and morbidity.  The differential diagnosis includes TIA, electrolyte abnormality, delirium, metabolic encephalopathy, toxic encephalopathy  MDM: Patient seen emergency department for evaluation of fatigue and confusion.   Physical exam reveals a fatigued and confused patient but is otherwise unremarkable outside of known chronic low back tenderness.  Laboratory evaluation with a slight elevation of the patient's BUN and creatinine and a drop in the GFR to 35, urinalysis with positive nitrites, small leuk esterase, 21-50 white blood cells and many bacteria.  Urine culture sent and ceftriaxone initiated.  CT head unremarkable.  Patient currently unable to complete her ADLs secondary to an ongoing UTI and will require hospital admission for symptomatic UTI.  Patient then admitted.   Additional history obtained: -Additional history obtained from son -External records from outside source obtained and reviewed including: Chart review including previous notes, labs, imaging, consultation notes   Lab Tests: -I ordered, reviewed, and interpreted labs.   The pertinent results include:   Labs Reviewed  COMPREHENSIVE METABOLIC PANEL - Abnormal; Notable for the following components:      Result Value   Chloride 114 (*)    Glucose, Bld 101 (*)    BUN 28 (*)    Creatinine, Ser 1.47 (*)    Calcium 8.7 (*)    Total Protein 6.4 (*)    GFR, Estimated 35 (*)    All other components within normal limits  URINALYSIS, ROUTINE W REFLEX MICROSCOPIC - Abnormal; Notable for the following components:   Hgb urine dipstick TRACE (*)    Nitrite POSITIVE (*)    Leukocytes,Ua SMALL (*)    All other components within normal limits  URINALYSIS, MICROSCOPIC (REFLEX) - Abnormal; Notable for the following components:   Bacteria, UA MANY (*)    All other components within normal limits  URINE CULTURE  CBC WITH DIFFERENTIAL/PLATELET  TROPONIN I (HIGH SENSITIVITY)  EKG   EKG Interpretation  Date/Time:  Wednesday August 13 2022 08:12:13 EDT Ventricular Rate:  61 PR Interval:  152 QRS Duration: 90 QT Interval:  443 QTC Calculation: 447 R Axis:   53 Text Interpretation: Sinus rhythm Left ventricular hypertrophy T wave  inversions unchanged from previous ecgs Confirmed by Jessen Siegman ([id number]) on 08/13/2022 11:02:12 AM         Imaging Studies ordered: I ordered imaging studies including CTH I independently visualized and interpreted imaging. I agree with the radiologist interpretation   Medicines ordered and prescription drug management: Meds ordered this encounter  Medications   lactated ringers bolus 1,000 mL   cefTRIAXone (ROCEPHIN) 1 g in sodium chloride 0.9 % 100 mL IVPB    Order Specific Question:   Antibiotic Indication:    Answer:   UTI    -I have reviewed the patients home medicines and have made adjustments as needed  Critical interventions none  Cardiac Monitoring: The patient was maintained on a cardiac monitor.  I personally viewed and interpreted the cardiac monitored which showed an underlying rhythm of: NSR  Social Determinants of Health:  Factors impacting patients care include: none   Reevaluation: After the interventions noted above, I reevaluated the patient and found that they have :stayed the same  Co morbidities that complicate the patient evaluation  Past Medical History:  Diagnosis Date   Abnormal chest x-ray    Abnormal results of liver function studies    Abnormal thyroid screen (blood)    Acute sinusitis    Alcohol screening    Anemia, deficiency    Arthropathy    BMI 22.0-22.9, adult    Carotid stenosis, bilateral    Carotid stenosis, bilateral    Cellulitis    Chronic kidney disease, stage III (moderate) (HCC)    Closed fracture of sacrum and coccyx, with routine healing, subsequent encounter    Colon cancer screening    Colon polyp    Dementia (HCC)    Dizzy spells    Encounter for screening mammogram for breast cancer    Facet arthropathy, lumbar    Fatty pancreas    Generalized abdominal or pelvic swelling or mass or lump    Glaucoma screening    H/O mammogram 2019   Per [facility] new patient packet   Hepatomegalia    High risk medication use     Hospital discharge follow-up    Hx of colonoscopy 2016   Per [facility] new patient packet   Hypertension    Hypertriglyceridemia    Influenza vaccination declined by patient    Low back pain    Lumbar disc disease with radiculopathy    Lung nodule    Magnesium deficiency    Medical non-compliance    Menopause    Microscopic hematuria    Mixed dyslipidemia    Neurodegenerative cognitive impairment (HCC)    Osteoarthritis, hip, bilateral    Overweight    Personal history of smoking    Plantar fasciitis    Poor appetite    Refusal of influenza vaccine by provider    Renal cyst, left    Right atrial enlargement    Right hip pain    Sciatica of right side    Screening for depression    Screening for HIV (human immunodeficiency virus)    Screening for STDs (sexually transmitted diseases)    Senile dementia without behavioral disturbance (HCC)    Shortness of breath    Spondylolisthesis, grade 1    Unintentional  weight loss    URI, acute    Urinary frequency    UTI (urinary tract infection), bacterial    Vitamin D deficiency       Dispostion: I considered admission for this patient, and due to persistent confusion, weakness and current UTI, patient require hospital admission for IV antibiotics     Final Clinical Impression(s) / ED Diagnoses Final diagnoses:  None     @PCDICTATION @    Teressa Lower, MD 08/13/22 1102

## 2022-08-14 ENCOUNTER — Inpatient Hospital Stay (HOSPITAL_COMMUNITY): Payer: Medicare Other

## 2022-08-14 ENCOUNTER — Encounter (HOSPITAL_COMMUNITY): Payer: Self-pay | Admitting: Internal Medicine

## 2022-08-14 DIAGNOSIS — N390 Urinary tract infection, site not specified: Secondary | ICD-10-CM | POA: Diagnosis present

## 2022-08-14 DIAGNOSIS — Z87440 Personal history of urinary (tract) infections: Secondary | ICD-10-CM | POA: Diagnosis not present

## 2022-08-14 DIAGNOSIS — Z8719 Personal history of other diseases of the digestive system: Secondary | ICD-10-CM | POA: Diagnosis not present

## 2022-08-14 DIAGNOSIS — G301 Alzheimer's disease with late onset: Secondary | ICD-10-CM | POA: Diagnosis present

## 2022-08-14 DIAGNOSIS — Z8673 Personal history of transient ischemic attack (TIA), and cerebral infarction without residual deficits: Secondary | ICD-10-CM | POA: Diagnosis not present

## 2022-08-14 DIAGNOSIS — E559 Vitamin D deficiency, unspecified: Secondary | ICD-10-CM | POA: Diagnosis present

## 2022-08-14 DIAGNOSIS — Z79899 Other long term (current) drug therapy: Secondary | ICD-10-CM | POA: Diagnosis not present

## 2022-08-14 DIAGNOSIS — N3000 Acute cystitis without hematuria: Secondary | ICD-10-CM | POA: Diagnosis present

## 2022-08-14 DIAGNOSIS — Z87891 Personal history of nicotine dependence: Secondary | ICD-10-CM | POA: Diagnosis not present

## 2022-08-14 DIAGNOSIS — Z791 Long term (current) use of non-steroidal anti-inflammatories (NSAID): Secondary | ICD-10-CM | POA: Diagnosis not present

## 2022-08-14 DIAGNOSIS — I6523 Occlusion and stenosis of bilateral carotid arteries: Secondary | ICD-10-CM | POA: Diagnosis present

## 2022-08-14 DIAGNOSIS — R531 Weakness: Secondary | ICD-10-CM | POA: Diagnosis present

## 2022-08-14 DIAGNOSIS — F0280 Dementia in other diseases classified elsewhere without behavioral disturbance: Secondary | ICD-10-CM | POA: Diagnosis present

## 2022-08-14 DIAGNOSIS — Z821 Family history of blindness and visual loss: Secondary | ICD-10-CM | POA: Diagnosis not present

## 2022-08-14 DIAGNOSIS — Z820 Family history of epilepsy and other diseases of the nervous system: Secondary | ICD-10-CM | POA: Diagnosis not present

## 2022-08-14 DIAGNOSIS — B9620 Unspecified Escherichia coli [E. coli] as the cause of diseases classified elsewhere: Secondary | ICD-10-CM

## 2022-08-14 DIAGNOSIS — I10 Essential (primary) hypertension: Secondary | ICD-10-CM | POA: Diagnosis not present

## 2022-08-14 DIAGNOSIS — N1832 Chronic kidney disease, stage 3b: Secondary | ICD-10-CM | POA: Diagnosis present

## 2022-08-14 DIAGNOSIS — E782 Mixed hyperlipidemia: Secondary | ICD-10-CM | POA: Diagnosis present

## 2022-08-14 DIAGNOSIS — Z0389 Encounter for observation for other suspected diseases and conditions ruled out: Secondary | ICD-10-CM | POA: Diagnosis not present

## 2022-08-14 DIAGNOSIS — R627 Adult failure to thrive: Secondary | ICD-10-CM | POA: Diagnosis present

## 2022-08-14 DIAGNOSIS — Z8249 Family history of ischemic heart disease and other diseases of the circulatory system: Secondary | ICD-10-CM | POA: Diagnosis not present

## 2022-08-14 DIAGNOSIS — Z6824 Body mass index (BMI) 24.0-24.9, adult: Secondary | ICD-10-CM | POA: Diagnosis not present

## 2022-08-14 DIAGNOSIS — R630 Anorexia: Secondary | ICD-10-CM | POA: Diagnosis not present

## 2022-08-14 DIAGNOSIS — I129 Hypertensive chronic kidney disease with stage 1 through stage 4 chronic kidney disease, or unspecified chronic kidney disease: Secondary | ICD-10-CM | POA: Diagnosis present

## 2022-08-14 DIAGNOSIS — Z823 Family history of stroke: Secondary | ICD-10-CM | POA: Diagnosis not present

## 2022-08-14 HISTORY — DX: Urinary tract infection, site not specified: N39.0

## 2022-08-14 LAB — COMPREHENSIVE METABOLIC PANEL
ALT: 22 U/L (ref 0–44)
AST: 27 U/L (ref 15–41)
Albumin: 3.6 g/dL (ref 3.5–5.0)
Alkaline Phosphatase: 76 U/L (ref 38–126)
Anion gap: 9 (ref 5–15)
BUN: 21 mg/dL (ref 8–23)
CO2: 24 mmol/L (ref 22–32)
Calcium: 9.2 mg/dL (ref 8.9–10.3)
Chloride: 108 mmol/L (ref 98–111)
Creatinine, Ser: 1.14 mg/dL — ABNORMAL HIGH (ref 0.44–1.00)
GFR, Estimated: 48 mL/min — ABNORMAL LOW (ref >60)
Glucose, Bld: 111 mg/dL — ABNORMAL HIGH (ref 70–99)
Potassium: 4.1 mmol/L (ref 3.5–5.1)
Sodium: 141 mmol/L (ref 135–145)
Total Bilirubin: 0.7 mg/dL (ref 0.3–1.2)
Total Protein: 6.2 g/dL — ABNORMAL LOW (ref 6.5–8.1)

## 2022-08-14 LAB — CBC WITH DIFFERENTIAL/PLATELET
Abs Immature Granulocytes: 0.03 10*3/uL (ref 0.00–0.07)
Basophils Absolute: 0 10*3/uL (ref 0.0–0.1)
Basophils Relative: 0 %
Eosinophils Absolute: 0.1 10*3/uL (ref 0.0–0.5)
Eosinophils Relative: 1 %
HCT: 41.2 % (ref 36.0–46.0)
Hemoglobin: 13.7 g/dL (ref 12.0–15.0)
Immature Granulocytes: 0 %
Lymphocytes Relative: 16 %
Lymphs Abs: 1.7 10*3/uL (ref 0.7–4.0)
MCH: 30.9 pg (ref 26.0–34.0)
MCHC: 33.3 g/dL (ref 30.0–36.0)
MCV: 93 fL (ref 80.0–100.0)
Monocytes Absolute: 0.6 10*3/uL (ref 0.1–1.0)
Monocytes Relative: 6 %
Neutro Abs: 8 10*3/uL — ABNORMAL HIGH (ref 1.7–7.7)
Neutrophils Relative %: 77 %
Platelets: 186 10*3/uL (ref 150–400)
RBC: 4.43 MIL/uL (ref 3.87–5.11)
RDW: 12 % (ref 11.5–15.5)
WBC: 10.4 10*3/uL (ref 4.0–10.5)
nRBC: 0 % (ref 0.0–0.2)

## 2022-08-14 LAB — GLUCOSE, CAPILLARY
Glucose-Capillary: 103 mg/dL — ABNORMAL HIGH (ref 70–99)
Glucose-Capillary: 106 mg/dL — ABNORMAL HIGH (ref 70–99)
Glucose-Capillary: 99 mg/dL (ref 70–99)

## 2022-08-14 MED ORDER — INFLUENZA VAC A&B SA ADJ QUAD 0.5 ML IM PRSY: 0.5000 mL | PREFILLED_SYRINGE | INTRAMUSCULAR | Status: DC | PRN

## 2022-08-14 NOTE — Significant Event (Addendum)
Rapid Response Event Note   Reason for Call :  Worsening AMS/Responding to pain   Initial Focused Assessment:  Patient in bed with eyes closed. Not responding to voice on initial assessment, responding to sternal rub with a moan. CBG 106. Over one hour time period patient became more alert and responding to voice again as she was on previous assessment. MD notified and MRI changed to STAT. MRI contacted and aware that MRI was made STAT. Patient transported by 3rd floor staff to MRI.   Halfway through rapid patient began to wake up and became oriented x2-able to recognize specific family members by name. Grip equal in both hands. Speech clear when answering questions  Vitals as follow: 107/68, NSR HR 68, SpO2 98% RA. Lungs clear bilaterally, no signs of respiratory distress. Pulses 2+ equal   Family at bedside during rapid response and questions answered  Event Summary:   MD Notified: [place] Call [place] End Time: [place]  Josph Macho, RN

## 2022-08-14 NOTE — Plan of Care (Signed)

## 2022-08-14 NOTE — Significant Event (Signed)
Rapid Response Event Note   Reason for Call :  AMS and Left facial droop   Initial Focused Assessment:  Patient in bed resting with eyes closed. Responds to voice. Per son at bedside patient developed facial droop yesterday, nearly 24 hours ago. Head CT done in ED with no concern for stroke- report showed atrophy and white matter microvascular disease. Son informed this RN that she "gets like this when she has a UTI". Does have history of Alzheimer. Per son patient is normally alert but does have periods of confusion.    Patient alert to self and place. Could not tell me what was going on or year. Follows commands. Grip equal and moderate. Able to keep both arms off the bed for a complete 10 seconds. Wiggles toes but unable to lift both legs off bed for prolong time, but per son this is not new as she is weak. Effort was made to keep legs off the bed. Left facial droop noted but per bedside RN and son this is not new from today, started yesterday. Speech was soft but clear. Pupils 2 mm, equal and reactive. BP 107/58, HR 64 and SpO2 98% room air   No signs of respiratory distress or any other issues at this time. Bedside RN to call rapid back if new symptoms begin.   Interventions:   MRI ordered by Sherral Hammers MD  Event Summary:   MD Notified: Sherral Hammers MD Call Time: [place] Time: 1101 End Time: [place], RN

## 2022-08-14 NOTE — Progress Notes (Signed)
Christina Perkins FKC:[id number] DOB: 1941-05-22 DOA: 08/13/2022 PCP: Sandrea Hughs, NP   Subj: 81 year old African-American female history of hypertension, CKD stage IIIb, Alzheimer type dementia presents to the ER today with weakness.  Patient brought in by her son.   Per the note, patient had weakness for the past week.  Was being treated for UTI.   Son is not available for interview.  Attempted to call him at [phone].  There was no answer.   No reported fever, chills, nausea, vomiting.   Laboratory evaluation in the ER showed urinalysis with positive nitrates, leukocyte esterase, bacteria, 21-50 WBCs.   White count 8.7, hemoglobin 12.8, platelets 184   BUN of 28, creatinine 1.47   Baseline creatinine approximately 1.3-1.4   Obj: Neurologic changes patient now only responding sternal rub per rapid response RN have upgraded to stat MRI brain   Objective: VITAL SIGNS: Temp: 97.7 F (36.5 C) (09/28 0458) Temp Source: Oral (09/28 0458) BP: 122/76 (09/28 0458) Pulse Rate: 66 (09/28 0458) SPO2; FIO2:   Intake/Output Summary (Last 24 hours) at 08/14/2022 0717 Last data filed at 08/14/2022 0535 Gross per 24 hour  Intake 1101.75 ml  Output 1100 ml  Net 1.75 ml     Exam: General: No acute respiratory distress Lungs: Clear to auscultation bilaterally without wheezes or crackles Cardiovascular: Regular rate and rhythm without murmur gallop or rub normal S1 and S2 Abdomen: Nontender, nondistended, soft, bowel sounds positive, no rebound, no ascites, no appreciable mass Extremities: No significant cyanosis, clubbing, or edema bilateral lower extremities Skin: Negative rashes, lesions, ulcers Psychiatric:  Negative depression, negative anxiety, negative fatigue, negative mania  Central nervous system:  Cranial nerves II through XII intact, negative dysarthria, negative expressive aphasia, negative receptive aphasia.  .  Mobility Assessment (last 72 hours)     Mobility  Assessment     Row Name 08/13/22 2151           Does patient have an order for bedrest or is patient medically unstable No - Continue assessment       What is the highest level of mobility based on the progressive mobility assessment? Level 3 (Stands with assist) - Balance while standing  and cannot march in place       Is the above level different from baseline mobility prior to current illness? Yes - Recommend PT order                  DVT prophylaxis:  Code Status:  Family Communication:  Status is: Inpatient    Dispo: The patient is from: [facility] care               Anticipated d/c is to: [facility] d/c date is: 3 days              Patient currently is not medically stable to d/c.    Procedures/Significant Events: 9/28 MRI Brain negative for acute CVA.  Old brainstem infarct.   Consultants:     Cultures 9/27 urine positive E. Coli   Antimicrobials: Anti-infectives (From admission, onward)    Start     Dose/Rate Route Frequency Ordered Stop   08/14/22 1100  cefTRIAXone (ROCEPHIN) 1 g in sodium chloride 0.9 % 100 mL IVPB        1 g 200 mL/hr over 30 Minutes Intravenous Every 24 hours 08/13/22 2150     08/13/22 1100  cefTRIAXone (  ROCEPHIN) 1 g in sodium chloride 0.9 % 100 mL IVPB        1 g 200 mL/hr over 30 Minutes Intravenous  Once 08/13/22 1052 08/13/22 1137        A/P Acute cystitis without hematuria/positive E. Coli UTI Observation medical bed. IV rocephin 1 gram qday.. Urine cx obtained in ER. Repeat CBC in AM.   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 Follows with outpatient neurology in  [facility].   Essential hypertension Currently not on maintenance HTN meds as outpatient. Start low dose labetalol.   Generalized weakness PT consult.         Care during the described time interval was provided by me .  I have reviewed this patient's available data, including medical history, events of note, physical examination, and all test results as part of my evaluation.

## 2022-08-14 NOTE — Progress Notes (Signed)
PT Cancellation Note  Patient Details Name: Christina Perkins MRN: [id number] DOB: [birth year]   Cancelled Treatment:     PT order received but eval deferred this date - pt with Rapid Response called this am and with follow up MRI ordered.  Will follow.   Emya Picado 08/14/2022, 1:01 PM

## 2022-08-15 DIAGNOSIS — R531 Weakness: Secondary | ICD-10-CM | POA: Diagnosis not present

## 2022-08-15 DIAGNOSIS — I10 Essential (primary) hypertension: Secondary | ICD-10-CM | POA: Diagnosis not present

## 2022-08-15 DIAGNOSIS — N3000 Acute cystitis without hematuria: Secondary | ICD-10-CM | POA: Diagnosis not present

## 2022-08-15 DIAGNOSIS — N390 Urinary tract infection, site not specified: Secondary | ICD-10-CM | POA: Diagnosis not present

## 2022-08-15 LAB — CBC WITH DIFFERENTIAL/PLATELET
Abs Immature Granulocytes: 0.02 10*3/uL (ref 0.00–0.07)
Basophils Absolute: 0 10*3/uL (ref 0.0–0.1)
Basophils Relative: 0 %
Eosinophils Absolute: 0.2 10*3/uL (ref 0.0–0.5)
Eosinophils Relative: 2 %
HCT: 42 % (ref 36.0–46.0)
Hemoglobin: 13.4 g/dL (ref 12.0–15.0)
Immature Granulocytes: 0 %
Lymphocytes Relative: 25 %
Lymphs Abs: 2.4 10*3/uL (ref 0.7–4.0)
MCH: 30.6 pg (ref 26.0–34.0)
MCHC: 31.9 g/dL (ref 30.0–36.0)
MCV: 95.9 fL (ref 80.0–100.0)
Monocytes Absolute: 0.8 10*3/uL (ref 0.1–1.0)
Monocytes Relative: 8 %
Neutro Abs: 6.1 10*3/uL (ref 1.7–7.7)
Neutrophils Relative %: 65 %
Platelets: 185 10*3/uL (ref 150–400)
RBC: 4.38 MIL/uL (ref 3.87–5.11)
RDW: 12.4 % (ref 11.5–15.5)
WBC: 9.5 10*3/uL (ref 4.0–10.5)
nRBC: 0 % (ref 0.0–0.2)

## 2022-08-15 LAB — COMPREHENSIVE METABOLIC PANEL
ALT: 22 U/L (ref 0–44)
AST: 29 U/L (ref 15–41)
Albumin: 3.5 g/dL (ref 3.5–5.0)
Alkaline Phosphatase: 72 U/L (ref 38–126)
Anion gap: 6 (ref 5–15)
BUN: 23 mg/dL (ref 8–23)
CO2: 28 mmol/L (ref 22–32)
Calcium: 9.3 mg/dL (ref 8.9–10.3)
Chloride: 109 mmol/L (ref 98–111)
Creatinine, Ser: 1.33 mg/dL — ABNORMAL HIGH (ref 0.44–1.00)
GFR, Estimated: 40 mL/min — ABNORMAL LOW (ref >60)
Glucose, Bld: 102 mg/dL — ABNORMAL HIGH (ref 70–99)
Potassium: 4.4 mmol/L (ref 3.5–5.1)
Sodium: 143 mmol/L (ref 135–145)
Total Bilirubin: 0.5 mg/dL (ref 0.3–1.2)
Total Protein: 6.3 g/dL — ABNORMAL LOW (ref 6.5–8.1)

## 2022-08-15 LAB — MAGNESIUM: Magnesium: 2.2 mg/dL (ref 1.7–2.4)

## 2022-08-15 LAB — URINE CULTURE: Culture: >=100000 — AB

## 2022-08-15 LAB — PHOSPHORUS: Phosphorus: 4.5 mg/dL (ref 2.5–4.6)

## 2022-08-15 NOTE — Progress Notes (Signed)
Christina Perkins HKV:[id number] DOB: 1941-04-24 DOA: 08/13/2022 PCP: Sandrea Hughs, NP   Subj: 81 year old African-American female history of hypertension, CKD stage IIIb, Alzheimer type dementia presents to the ER today with weakness.  Patient brought in by her son.   Per the note, patient had weakness for the past week.  Was being treated for UTI.   Son is not available for interview.  Attempted to call him at [phone].  There was no answer.   No reported fever, chills, nausea, vomiting.   Laboratory evaluation in the ER showed urinalysis with positive nitrates, leukocyte esterase, bacteria, 21-50 WBCs.   White count 8.7, hemoglobin 12.8, platelets 184   BUN of 28, creatinine 1.47   Baseline creatinine approximately 1.3-1.4   Obj: A/O x1, patient very pleasant follows all commands joking with staff.    Objective: VITAL SIGNS: Temp: 97.9 F (36.6 C) (09/29 1310) BP: 104/59 (09/29 1310) Pulse Rate: 78 (09/29 1310) SPO2; FIO2:   Intake/Output Summary (Last 24 hours) at 08/15/2022 2035 Last data filed at 08/15/2022 8416 Gross per 24 hour  Intake 240 ml  Output 300 ml  Net -60 ml      Exam: General: A/O x1 (does not know where, when, why) No acute respiratory distress Lungs: Clear to auscultation bilaterally without wheezes or crackles Cardiovascular: Regular rate and rhythm without murmur gallop or rub normal S1 and S2 Abdomen: Nontender, nondistended, soft, bowel sounds positive, no rebound, no ascites, no appreciable mass Extremities: No significant cyanosis, clubbing, or edema bilateral lower extremities Skin: Negative rashes, lesions, ulcers Psychiatric:  Negative depression, negative anxiety, negative fatigue, negative mania  Central nervous system:  Cranial nerves II through XII intact, negative dysarthria, negative expressive aphasia, negative receptive aphasia.  .  Mobility Assessment (last 72 hours)     Mobility Assessment     Row Name 08/15/22 1349  08/15/22 0729 08/14/22 2115 08/13/22 2151     Does patient have an order for bedrest or is patient medically unstable -- No - Continue assessment No - Continue assessment No - Continue assessment    What is the highest level of mobility based on the progressive mobility assessment? Level 3 (Stands with assist) - Balance while standing  and cannot march in place Level 3 (Stands with assist) - Balance while standing  and cannot march in place Level 3 (Stands with assist) - Balance while standing  and cannot march in place Level 3 (Stands with assist) - Balance while standing  and cannot march in place    Is the above level different from baseline mobility prior to current illness? -- Yes - Recommend PT order Yes - Recommend PT order Yes - Recommend PT order               DVT prophylaxis:  Code Status:  Family Communication:  Status is: Inpatient    Dispo: The patient is from: [employer] care               Anticipated d/c is to: [facility] d/c date is: 3 days              Patient currently is not medically stable to d/c.    Procedures/Significant Events: 9/28 MRI Brain negative for acute CVA.  Old brainstem infarct.   Consultants:     Cultures 9/27 urine positive E. Coli   Antimicrobials: Anti-infectives (From admission, onward)    Start  Ordered Stop   08/14/22 1100  cefTRIAXone (ROCEPHIN) 1 g in sodium chloride 0.9 % 100 mL IVPB        08/13/22 2150     08/13/22 1100  cefTRIAXone (ROCEPHIN) 1 g in sodium chloride 0.9 % 100 mL IVPB        08/13/22 1052 08/13/22 1137        A/P Acute cystitis without hematuria/positive E. 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 Follows with outpatient neurology in [facility].   Essential HTN -Currently not on maintenance HTN meds as outpatient.   Generalized weakness -9/29 PT recommends SNF.         Care during the described time interval was provided by me .  I have reviewed this patient's available data, including medical history, events of note, physical examination, and all test results as part of my evaluation.

## 2022-08-15 NOTE — Evaluation (Signed)
Physical Therapy Evaluation Patient Details Name: Christina Perkins MRN: [id number] DOB: March 01, 1941 Today's Date: 08/15/2022  History of Present Illness  Pt is an 81yo female presenting to [facility] ED on 9/27 with son reporting weakness. CT head no acute findings in ED. Rapid called on 9/28 at 1236 secondary to facial droop. 2nd Rapid called 9/28 1530 nonresponsive to voice only sternal rub. 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 Urinalysis revelead EColi UTI. PMH: Alzheimer's, HTN, CKD3.   Clinical Impression  Pt presents with the problems listed above and functional impairments below. Pt alert and oriented to self, able to identify son present during session by name. Son very helpful in providing home environment and PLOF: ambulates with RW and lives alone, family stops by daily. Pt required max assist +2 for bed mobility and transfers, pt groaning in pain, reporting R hip/SIJ pain. Pt attempting to keep eyes closed during session, required cuing to keep open. Discussed SNF-level therapies upon discharge, pt and family member are amenable. We will continue to follow acutely.      Recommendations for follow up therapy are one component of a multi-disciplinary discharge planning process, led by the attending physician.  Recommendations may be updated based on patient status, additional functional criteria and insurance authorization.  Follow Up Recommendations Skilled nursing-short term rehab (<3 hours/day) Can patient physically be transported by private vehicle: No    Assistance Recommended at Discharge Frequent or constant Supervision/Assistance  Patient can return home with the following  Two people to help with walking and/or transfers;Two people to help with bathing/dressing/bathroom;Assistance with cooking/housework;Assistance with feeding;Direct supervision/assist for financial management;Direct supervision/assist for medications management;Help with stairs or ramp for entrance;Assist  for transportation    Equipment Recommendations Other (comment) (TBD)  Recommendations for Other Services       Functional Status Assessment Patient has had a recent decline in their functional status and demonstrates the ability to make significant improvements in function in a reasonable and predictable amount of time.     Precautions / Restrictions Precautions Precautions: Fall Restrictions Weight Bearing Restrictions: No      Mobility  Bed Mobility Overal bed mobility: Needs Assistance Bed Mobility: Supine to Sit     Supine to sit: Max assist, +2 for physical assistance, +2 for safety/equipment, HOB elevated     General bed mobility comments: Max assist +2 using chuck pad and "helicopter method." Upon sitting EOB, pt complaining of SIJ/lower back pain R>L.    Transfers Overall transfer level: Needs assistance Equipment used: 2 person hand held assist Transfers: Sit to/from Stand, Bed to chair/wheelchair/BSC Sit to Stand: Max assist, +2 physical assistance, +2 safety/equipment     Squat pivot transfers: Max assist, +2 safety/equipment     General transfer comment: Pt required max assist +2 for lift assist, pt groaning in pain upon standing, returned to EOB. Pt agreeable to try transferring to recliner, pt required Max assist for lift assist to come to squat, then place on recliner surface, +2 for safety, max A +2 to use chuck pad to scoot back into recliner. Further mobility deferred.    Ambulation/Gait               General Gait Details: Too painful for pt at present.  Stairs            Wheelchair Mobility    Modified Rankin (Stroke Patients Only)       Balance Overall balance assessment: Needs assistance Sitting-balance support: Feet supported, No upper extremity supported Sitting balance-Leahy  Scale: Good     Standing balance support: Bilateral upper extremity supported, During functional activity, Reliant on assistive device for  balance Standing balance-Leahy Scale: Zero Standing balance comment: Pt unable to stand without max/total assistance                             Pertinent Vitals/Pain Pain Assessment Pain Assessment: Faces Faces Pain Scale: Hurts whole lot Pain Location: lower back and SIJ R>L Pain Descriptors / Indicators: Discomfort Pain Intervention(s): Limited activity within patient's tolerance, Monitored during session, Repositioned    Home Living Family/patient expects to be discharged to:: Private residence Living Arrangements: Alone Available Help at Discharge: Family;Available PRN/intermittently (Son Christina Perkins spends mornings with her, Sister in the afternoon) Type of Home: House Home Access: Stairs to enter Entrance Stairs-Rails: Right;Left;Can reach both Secretary/administrator of Steps: 3   Home Layout: One level Home Equipment: Tub bench;Rolling Walker (2 wheels);BSC/3in1;Grab bars - toilet      Prior Function Prior Level of Function : Independent/Modified Independent             Mobility Comments: Uses RW intermittently ADLs Comments: Able to change her Depends     Hand Dominance   Dominant Hand: Right    Extremity/Trunk Assessment   Upper Extremity Assessment Upper Extremity Assessment: Generalized weakness;Difficult to assess due to impaired cognition    Lower Extremity Assessment Lower Extremity Assessment: Generalized weakness;Difficult to assess due to impaired cognition    Cervical / Trunk Assessment Cervical / Trunk Assessment: Kyphotic  Communication   Communication: No difficulties  Cognition Arousal/Alertness: Lethargic Behavior During Therapy: WFL for tasks assessed/performed Overall Cognitive Status: History of cognitive impairments - at baseline                                          General Comments General comments (skin integrity, edema, etc.): Son Christina Perkins present    Exercises     Assessment/Plan    PT  Assessment Patient needs continued PT services  PT Problem List Decreased strength;Decreased range of motion;Decreased activity tolerance;Decreased balance;Decreased mobility;Decreased coordination;Decreased cognition;Decreased knowledge of use of DME;Decreased safety awareness;Pain       PT Treatment Interventions DME instruction;Gait training;Stair training;Functional mobility training;Therapeutic activities;Therapeutic exercise;Balance training;Neuromuscular re-education;Patient/family education;Cognitive remediation;Wheelchair mobility training    PT Goals (Current goals can be found in the Care Plan section)  Acute Rehab PT Goals Patient Stated Goal: Less pain PT Goal Formulation: With patient/family Time For Goal Achievement: 08/29/22 Potential to Achieve Goals: Fair    Frequency Min 2X/week     Co-evaluation               AM-PAC PT "6 Clicks" Mobility  Outcome Measure Help needed turning from your back to your side while in a flat bed without using bedrails?: A Lot Help needed moving from lying on your back to sitting on the side of a flat bed without using bedrails?: A Lot Help needed moving to and from a bed to a chair (including a wheelchair)?: Total Help needed standing up from a chair using your arms (e.g., wheelchair or bedside chair)?: Total Help needed to walk in hospital room?: Total Help needed climbing 3-5 steps with a railing? : Total 6 Click Score: 8    End of Session Equipment Utilized During Treatment: Gait belt Activity Tolerance: Patient limited by pain Patient left: in  chair;with call bell/phone within reach;with chair alarm set;with family/visitor present Nurse Communication: Mobility status PT Visit Diagnosis: Pain;Difficulty in walking, not elsewhere classified (R26.2);Muscle weakness (generalized) (M62.81) Pain - Right/Left: Right Pain - part of body: Hip (lower back)    Time: 3361-2244 PT Time Calculation (min) (ACUTE ONLY): 15  min   Charges:   PT Evaluation $PT Eval Low Complexity: 1 Low          Coolidge Breeze, PT, DPT [facility] Rehabilitation Department Office: [phone] Weekend pager: [phone]  Coolidge Breeze 08/15/2022, 3:43 PM

## 2022-08-15 NOTE — Plan of Care (Signed)
  Problem: Activity: Goal: Risk for activity intolerance will decrease Outcome: Progressing   Problem: Pain Managment: Goal: General experience of comfort will improve Outcome: Progressing   Problem: Safety: Goal: Ability to remain free from injury will improve Outcome: Progressing   

## 2022-08-15 NOTE — Plan of Care (Signed)
Plan of care reviewed. 

## 2022-08-16 DIAGNOSIS — N390 Urinary tract infection, site not specified: Secondary | ICD-10-CM | POA: Diagnosis not present

## 2022-08-16 DIAGNOSIS — N3000 Acute cystitis without hematuria: Secondary | ICD-10-CM | POA: Diagnosis not present

## 2022-08-16 DIAGNOSIS — I10 Essential (primary) hypertension: Secondary | ICD-10-CM | POA: Diagnosis not present

## 2022-08-16 DIAGNOSIS — R531 Weakness: Secondary | ICD-10-CM | POA: Diagnosis not present

## 2022-08-16 LAB — COMPREHENSIVE METABOLIC PANEL
ALT: 26 U/L (ref 0–44)
AST: 45 U/L — ABNORMAL HIGH (ref 15–41)
Albumin: 3.4 g/dL — ABNORMAL LOW (ref 3.5–5.0)
Alkaline Phosphatase: 75 U/L (ref 38–126)
Anion gap: 8 (ref 5–15)
BUN: 25 mg/dL — ABNORMAL HIGH (ref 8–23)
CO2: 26 mmol/L (ref 22–32)
Calcium: 9 mg/dL (ref 8.9–10.3)
Chloride: 107 mmol/L (ref 98–111)
Creatinine, Ser: 1.18 mg/dL — ABNORMAL HIGH (ref 0.44–1.00)
GFR, Estimated: 46 mL/min — ABNORMAL LOW (ref >60)
Glucose, Bld: 106 mg/dL — ABNORMAL HIGH (ref 70–99)
Potassium: 4.2 mmol/L (ref 3.5–5.1)
Sodium: 141 mmol/L (ref 135–145)
Total Bilirubin: 0.8 mg/dL (ref 0.3–1.2)
Total Protein: 6.2 g/dL — ABNORMAL LOW (ref 6.5–8.1)

## 2022-08-16 LAB — PHOSPHORUS: Phosphorus: 4 mg/dL (ref 2.5–4.6)

## 2022-08-16 LAB — CBC WITH DIFFERENTIAL/PLATELET
Abs Immature Granulocytes: 0.04 10*3/uL (ref 0.00–0.07)
Basophils Absolute: 0 10*3/uL (ref 0.0–0.1)
Basophils Relative: 0 %
Eosinophils Absolute: 0.1 10*3/uL (ref 0.0–0.5)
Eosinophils Relative: 1 %
HCT: 39.4 % (ref 36.0–46.0)
Hemoglobin: 12.6 g/dL (ref 12.0–15.0)
Immature Granulocytes: 0 %
Lymphocytes Relative: 23 %
Lymphs Abs: 2.2 10*3/uL (ref 0.7–4.0)
MCH: 30.4 pg (ref 26.0–34.0)
MCHC: 32 g/dL (ref 30.0–36.0)
MCV: 95.2 fL (ref 80.0–100.0)
Monocytes Absolute: 0.8 10*3/uL (ref 0.1–1.0)
Monocytes Relative: 8 %
Neutro Abs: 6.4 10*3/uL (ref 1.7–7.7)
Neutrophils Relative %: 68 %
Platelets: 195 10*3/uL (ref 150–400)
RBC: 4.14 MIL/uL (ref 3.87–5.11)
RDW: 12.5 % (ref 11.5–15.5)
WBC: 9.6 10*3/uL (ref 4.0–10.5)
nRBC: 0 % (ref 0.0–0.2)

## 2022-08-16 LAB — MAGNESIUM: Magnesium: 2.2 mg/dL (ref 1.7–2.4)

## 2022-08-16 MED ORDER — SODIUM CHLORIDE 0.9 % IV SOLN: INTRAVENOUS | Status: DC | PRN

## 2022-08-16 NOTE — Plan of Care (Signed)
Plan of care reviewed. 

## 2022-08-16 NOTE — Plan of Care (Signed)
  Problem: Pain Managment: Goal: General experience of comfort will improve Outcome: Progressing   

## 2022-08-16 NOTE — Progress Notes (Signed)
Christina Perkins WGN:[id number] DOB: [birth year] DOA: 08/13/2022 PCP: Sandrea Hughs, NP   Subj: 81 year old African-American female history of hypertension, CKD stage IIIb, Alzheimer type dementia presents to the ER today with weakness.  Patient brought in by her son.   Per the note, patient had weakness for the past week.  Was being treated for UTI.   Son is not available for interview.  Attempted to call him at [phone].  There was no answer.   No reported fever, chills, nausea, vomiting.   Laboratory evaluation in the ER showed urinalysis with positive nitrates, leukocyte esterase, bacteria, 21-50 WBCs.   White count 8.7, hemoglobin 12.8, platelets 184   BUN of 28, creatinine 1.47   Baseline creatinine approximately 1.3-1.4   Obj: 9/30 A/O x1 (does not know where, when, why).  Very pleasant sitting in chair negative abdominal pain, negative nausea.  Follows all commands   Objective: VITAL SIGNS: Temp: 97.5 F (36.4 C) (09/30 1638) Temp Source: Oral (09/30 1638) BP: 122/70 (09/30 1638) Pulse Rate: 74 (09/30 1638) SPO2; FIO2:   Intake/Output Summary (Last 24 hours) at 08/16/2022 1659 Last data filed at 08/16/2022 1400 Gross per 24 hour  Intake 558.17 ml  Output 350 ml  Net 208.17 ml      Exam: General: A/O x1 (does not know where, when, why) No acute respiratory distress Lungs: Clear to auscultation bilaterally without wheezes or crackles Cardiovascular: Regular rate and rhythm without murmur gallop or rub normal S1 and S2 Abdomen: Nontender, nondistended, soft, bowel sounds positive, no rebound, no ascites, no appreciable mass, negative CVA tenderness, negative bladder tenderness. Extremities: No significant cyanosis, clubbing, or edema bilateral lower extremities Skin: Negative rashes, lesions, ulcers Psychiatric:  Negative depression, negative anxiety, negative fatigue, negative mania  Central nervous system:  Cranial nerves II through XII intact, negative  dysarthria, negative expressive aphasia, negative receptive aphasia.  .  Mobility Assessment (last 72 hours)     Mobility Assessment     Row Name 08/16/22 1041 08/15/22 2025 08/15/22 1349 08/15/22 0729 08/14/22 2115   Does patient have an order for bedrest or is patient medically unstable No - Continue assessment No - Continue assessment -- No - Continue assessment No - Continue assessment   What is the highest level of mobility based on the progressive mobility assessment? Level 2 (Chairfast) - Balance while sitting on edge of bed and cannot stand Level 2 (Chairfast) - Balance while sitting on edge of bed and cannot stand Level 3 (Stands with assist) - Balance while standing  and cannot march in place Level 3 (Stands with assist) - Balance while standing  and cannot march in place Level 3 (Stands with assist) - Balance while standing  and cannot march in place   Is the above level different from baseline mobility prior to current illness? -- Yes - Recommend PT order -- Yes - Recommend PT order Yes - Recommend PT order    Row Name 08/13/22 2151           Does patient have an order for bedrest or is patient medically unstable No - Continue assessment       What is the highest level of mobility based on the progressive mobility assessment? Level 3 (Stands with assist) - Balance while standing  and cannot march in place       Is the above level different from baseline mobility prior to current illness? Yes - Recommend PT order  DVT prophylaxis:  Code Status: Full Family Communication: 9/30 son at bedside for discussion of plan of care all questions answered Status is: Inpatient    Dispo: The patient is from: [employer] care               Anticipated d/c is to: [facility] d/c date is: 3 days              Patient currently is not medically stable to d/c.    Procedures/Significant Events: 9/28 MRI Brain negative for acute  CVA.  Old brainstem infarct.   Consultants:     Cultures 9/27 urine positive E. Coli   Antimicrobials: Anti-infectives (From admission, onward)    Start     Ordered Stop   08/14/22 1100  cefTRIAXone (ROCEPHIN) 1 g in sodium chloride 0.9 % 100 mL IVPB        08/13/22 2150     08/13/22 1100  cefTRIAXone (ROCEPHIN) 1 g in sodium chloride 0.9 % 100 mL IVPB        08/13/22 1052 08/13/22 1137        A/P   Acute cystitis without hematuria/positive E. 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 Follows with outpatient neurology in [facility].   Essential HTN -Currently not on maintenance HTN meds as outpatient.   Generalized weakness -9/29 PT recommends SNF. -9/30 consult TOC SNF placement        Care during the described time interval was provided by me .  I have reviewed this patient's available data, including medical history, events of note, physical examination, and all test results as part of my evaluation.

## 2022-08-17 DIAGNOSIS — N3000 Acute cystitis without hematuria: Secondary | ICD-10-CM | POA: Diagnosis not present

## 2022-08-17 DIAGNOSIS — N390 Urinary tract infection, site not specified: Secondary | ICD-10-CM | POA: Diagnosis not present

## 2022-08-17 DIAGNOSIS — R627 Adult failure to thrive: Secondary | ICD-10-CM | POA: Diagnosis present

## 2022-08-17 DIAGNOSIS — R630 Anorexia: Secondary | ICD-10-CM | POA: Diagnosis present

## 2022-08-17 DIAGNOSIS — I10 Essential (primary) hypertension: Secondary | ICD-10-CM | POA: Diagnosis not present

## 2022-08-17 DIAGNOSIS — R531 Weakness: Secondary | ICD-10-CM | POA: Diagnosis not present

## 2022-08-17 LAB — COMPREHENSIVE METABOLIC PANEL
ALT: 40 U/L (ref 0–44)
AST: 58 U/L — ABNORMAL HIGH (ref 15–41)
Albumin: 3.4 g/dL — ABNORMAL LOW (ref 3.5–5.0)
Alkaline Phosphatase: 73 U/L (ref 38–126)
Anion gap: 6 (ref 5–15)
BUN: 25 mg/dL — ABNORMAL HIGH (ref 8–23)
CO2: 28 mmol/L (ref 22–32)
Calcium: 9.1 mg/dL (ref 8.9–10.3)
Chloride: 107 mmol/L (ref 98–111)
Creatinine, Ser: 1.22 mg/dL — ABNORMAL HIGH (ref 0.44–1.00)
GFR, Estimated: 44 mL/min — ABNORMAL LOW (ref >60)
Glucose, Bld: 114 mg/dL — ABNORMAL HIGH (ref 70–99)
Potassium: 4.5 mmol/L (ref 3.5–5.1)
Sodium: 141 mmol/L (ref 135–145)
Total Bilirubin: 0.7 mg/dL (ref 0.3–1.2)
Total Protein: 6.4 g/dL — ABNORMAL LOW (ref 6.5–8.1)

## 2022-08-17 LAB — CBC WITH DIFFERENTIAL/PLATELET
Abs Immature Granulocytes: 0.05 10*3/uL (ref 0.00–0.07)
Basophils Absolute: 0 10*3/uL (ref 0.0–0.1)
Basophils Relative: 0 %
Eosinophils Absolute: 0.1 10*3/uL (ref 0.0–0.5)
Eosinophils Relative: 1 %
HCT: 40.7 % (ref 36.0–46.0)
Hemoglobin: 12.9 g/dL (ref 12.0–15.0)
Immature Granulocytes: 1 %
Lymphocytes Relative: 21 %
Lymphs Abs: 2.1 10*3/uL (ref 0.7–4.0)
MCH: 30.3 pg (ref 26.0–34.0)
MCHC: 31.7 g/dL (ref 30.0–36.0)
MCV: 95.5 fL (ref 80.0–100.0)
Monocytes Absolute: 0.8 10*3/uL (ref 0.1–1.0)
Monocytes Relative: 8 %
Neutro Abs: 7.1 10*3/uL (ref 1.7–7.7)
Neutrophils Relative %: 69 %
Platelets: 183 10*3/uL (ref 150–400)
RBC: 4.26 MIL/uL (ref 3.87–5.11)
RDW: 12.6 % (ref 11.5–15.5)
WBC: 10.2 10*3/uL (ref 4.0–10.5)
nRBC: 0 % (ref 0.0–0.2)

## 2022-08-17 LAB — MAGNESIUM: Magnesium: 2.4 mg/dL (ref 1.7–2.4)

## 2022-08-17 LAB — PHOSPHORUS: Phosphorus: 4 mg/dL (ref 2.5–4.6)

## 2022-08-17 MED ORDER — DEXTROSE-NACL 5-0.9 % IV SOLN: INTRAVENOUS | Status: DC

## 2022-08-17 NOTE — Progress Notes (Signed)
Christina Perkins NAT:[id number] DOB: [birth year] DOA: 08/13/2022 PCP: Sandrea Hughs, NP   Subj: 81 year old African-American female history of hypertension, CKD stage IIIb, Alzheimer type dementia presents to the ER today with weakness.  Patient brought in by her son.   Per the note, patient had weakness for the past week.  Was being treated for UTI.   Son is not available for interview.  Attempted to call him at [phone].  There was no answer.   No reported fever, chills, nausea, vomiting.   Laboratory evaluation in the ER showed urinalysis with positive nitrates, leukocyte esterase, bacteria, 21-50 WBCs.   White count 8.7, hemoglobin 12.8, platelets 184   BUN of 28, creatinine 1.47   Baseline creatinine approximately 1.3-1.4   Obj: 10/1 A/O x1 (does not know where, when, why).  Very pleasant sitting in bed visiting with her son   Objective: VITAL SIGNS: Temp: 97.5 F (36.4 C) (10/01 1354) Temp Source: Oral (10/01 1354) BP: 133/69 (10/01 1354) Pulse Rate: 65 (10/01 1354) SPO2; FIO2:   Intake/Output Summary (Last 24 hours) at 08/17/2022 1505 Last data filed at 08/17/2022 0900 Gross per 24 hour  Intake 600 ml  Output 300 ml  Net 300 ml      Exam: General: A/O x1 (does not know where, when, why) No acute respiratory distress Lungs: Clear to auscultation bilaterally without wheezes or crackles Cardiovascular: Regular rate and rhythm without murmur gallop or rub normal S1 and S2 Abdomen: Nontender, nondistended, soft, bowel sounds positive, no rebound, no ascites, no appreciable mass, negative CVA tenderness, negative bladder tenderness. Extremities: No significant cyanosis, clubbing, or edema bilateral lower extremities Skin: Negative rashes, lesions, ulcers Psychiatric:  Negative depression, negative anxiety, negative fatigue, negative mania  Central nervous system:  Cranial nerves II through XII intact, negative dysarthria, negative expressive aphasia, negative  receptive aphasia.  .  Mobility Assessment (last 72 hours)     Mobility Assessment     Row Name 08/17/22 1110 08/16/22 1041 08/15/22 2025 08/15/22 1349 08/15/22 0729   Does patient have an order for bedrest or is patient medically unstable No - Continue assessment No - Continue assessment No - Continue assessment -- No - Continue assessment   What is the highest level of mobility based on the progressive mobility assessment? Level 2 (Chairfast) - Balance while sitting on edge of bed and cannot stand Level 2 (Chairfast) - Balance while sitting on edge of bed and cannot stand Level 2 (Chairfast) - Balance while sitting on edge of bed and cannot stand Level 3 (Stands with assist) - Balance while standing  and cannot march in place Level 3 (Stands with assist) - Balance while standing  and cannot march in place   Is the above level different from baseline mobility prior to current illness? -- -- Yes - Recommend PT order -- Yes - Recommend PT order    Row Name 08/14/22 2115           Does patient have an order for bedrest or is patient medically unstable No - Continue assessment       What is the highest level of mobility based on the progressive mobility assessment? Level 3 (Stands with assist) - Balance while standing  and cannot march in place       Is the above level different from baseline mobility prior to current illness? Yes - Recommend PT order                  DVT prophylaxis:  Code Status: Full Family Communication: 10/1 son at bedside for discussion of plan of care all questions answered Status is: Inpatient    Dispo: The patient is from: [employer] care               Anticipated d/c is to: [facility] d/c date is: 3 days              Patient currently is not medically stable to d/c.    Procedures/Significant Events: 9/28 MRI Brain negative for acute CVA.  Old brainstem infarct.   Consultants:     Cultures 9/27 urine  positive E. Coli   Antimicrobials: Anti-infectives (From admission, onward)    Start     Ordered Stop   08/14/22 1100  cefTRIAXone (ROCEPHIN) 1 g in sodium chloride 0.9 % 100 mL IVPB        08/13/22 2150     08/13/22 1100  cefTRIAXone (ROCEPHIN) 1 g in sodium chloride 0.9 % 100 mL IVPB        08/13/22 1052 08/13/22 1137        A/P   Acute cystitis without hematuria/positive E. 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 Follows with outpatient neurology in [facility].   Essential HTN -Currently not on maintenance HTN meds as outpatient.   Generalized weakness -9/29 PT recommends SNF. -9/30 consult TOC SNF placement   Anorexia/failure to thrive -10/1 poor intake D5-0.9% saline 27ml/hr       Care during the described time interval was provided by me .  I have reviewed this patient's available data, including medical history, events of note, physical examination, and all test results as part of my evaluation.

## 2022-08-17 NOTE — Plan of Care (Signed)
  Problem: Pain Managment: Goal: General experience of comfort will improve Outcome: Progressing   

## 2022-08-17 NOTE — TOC Initial Note (Signed)
Transition of Care [facility]) - Initial/Assessment Note    Patient Details  Name: Christina Perkins MRN: [id number] Date of Birth: Jan 04, 1941  Transition of Care [facility]) CM/SW Contact:    Henrietta Dine, RN Phone Number: 08/17/2022, 4:48 PM  Clinical Narrative:                 Pt previously from home alone; PT eval/recommendation for SNF; pt oriented x 1 to self; contacted pt's POC (son, Refujia Cellucci [phone]; he is agreeable to SNF; PASRR [id number]; FL2 sent; awaiting bed offers, SNF auth; TOC will con't to follow.   Expected Discharge Plan: Skilled Nursing Facility Barriers to Discharge: Continued Medical Work up   Patient Goals and CMS Choice Patient states their goals for this hospitalization and ongoing recovery are:: per son - snf      Expected Discharge Plan and Services Expected Discharge Plan: [place]   Discharge Planning Services: CM Consult   Living arrangements for the past 2 months: Single Family Home                                      Prior Living Arrangements/Services Living arrangements for the past 2 months: Single Family Home Lives with:: Self Patient language and need for interpreter reviewed:: Yes Do you feel safe going back to the place where you live?: Yes      Need for Family Participation in Patient Care: Yes (Comment) Care giver support system in place?: Yes (comment)   Criminal Activity/Legal Involvement Pertinent to Current Situation/Hospitalization: No - Comment as needed  Activities of Daily Living Home Assistive Devices/Equipment: Walker (specify type) ADL Screening (condition at time of admission) Patient's cognitive ability adequate to safely complete daily activities?: No Is the patient deaf or have difficulty hearing?: No Does the patient have difficulty seeing, even when wearing glasses/contacts?: No Does the patient have difficulty concentrating, remembering, or making decisions?: Yes Patient  able to express need for assistance with ADLs?: No Does the patient have difficulty dressing or bathing?: Yes Independently performs ADLs?: No Communication: Independent Dressing (OT): Needs assistance Is this a change from baseline?: Pre-admission baseline Grooming: Needs assistance Is this a change from baseline?: Pre-admission baseline Feeding: Needs assistance Is this a change from baseline?: Change from baseline, expected to last <3 days Bathing: Needs assistance Is this a change from baseline?: Pre-admission baseline Toileting: Needs assistance Is this a change from baseline?: Pre-admission baseline In/Out Bed: Needs assistance Is this a change from baseline?: Change from baseline, expected to last <3 days Walks in Home: Needs assistance Is this a change from baseline?: Pre-admission baseline Does the patient have difficulty walking or climbing stairs?: Yes Weakness of Legs: Both Weakness of Arms/Hands: Both  Permission Sought/Granted Permission sought to share information with : Case Manager Permission granted to share information with : Yes, Verbal Permission Granted  Share Information with NAME: Lenor Coffin, RN, CM           Emotional Assessment Appearance:: Appears younger than stated age Attitude/Demeanor/Rapport: Gracious Affect (typically observed): Accepting Orientation: : Oriented to Self Alcohol / Substance Use: Not Applicable Psych Involvement: No (comment)  Admission diagnosis:  UTI (urinary tract infection) [N39.0] Urinary tract infection without hematuria, site unspecified [N39.0] Patient Active Problem List   Diagnosis Date Noted   Failure to thrive in adult 08/17/2022   Anorexia 08/17/2022   UTI (urinary tract infection) 08/14/2022   E. coli UTI  08/14/2022   Acute cystitis without hematuria 08/13/2022   Stage 3b chronic kidney disease (CKD) ([place]) 08/13/2022   Acute bilateral low back pain without sciatica 11/08/2020   Late onset Alzheimer's  dementia without behavioral disturbance ([place]) 08/28/2020   Malnutrition of moderate degree 07/19/2020   Weakness 07/18/2020   Generalized weakness 07/18/2020   Fall at home, initial encounter 07/18/2020   Avascular necrosis of femoral head ([place]) 07/18/2020   Essential hypertension 07/18/2020   Injury of toe on right foot 11/07/2013   Pain in toe of right foot 11/07/2013   PCP:  Sandrea Hughs, NP Pharmacy:   [place], [place] [address] Phone: [phone] Fax: [place], [place] [address] Phone: [phone] Fax: [place] [address], [place] [address] Phone: [phone] Fax: [phone]     Social Determinants of Health (SDOH) Interventions    Readmission Risk Interventions     No data to display

## 2022-08-18 DIAGNOSIS — R627 Adult failure to thrive: Secondary | ICD-10-CM

## 2022-08-18 LAB — COMPREHENSIVE METABOLIC PANEL
ALT: 44 U/L (ref 0–44)
AST: 59 U/L — ABNORMAL HIGH (ref 15–41)
Albumin: 3.3 g/dL — ABNORMAL LOW (ref 3.5–5.0)
Alkaline Phosphatase: 64 U/L (ref 38–126)
Anion gap: 6 (ref 5–15)
BUN: 20 mg/dL (ref 8–23)
CO2: 25 mmol/L (ref 22–32)
Calcium: 8.8 mg/dL — ABNORMAL LOW (ref 8.9–10.3)
Chloride: 110 mmol/L (ref 98–111)
Creatinine, Ser: 1.1 mg/dL — ABNORMAL HIGH (ref 0.44–1.00)
GFR, Estimated: 50 mL/min — ABNORMAL LOW (ref >60)
Glucose, Bld: 112 mg/dL — ABNORMAL HIGH (ref 70–99)
Potassium: 4.5 mmol/L (ref 3.5–5.1)
Sodium: 141 mmol/L (ref 135–145)
Total Bilirubin: 0.6 mg/dL (ref 0.3–1.2)
Total Protein: 5.9 g/dL — ABNORMAL LOW (ref 6.5–8.1)

## 2022-08-18 LAB — CBC WITH DIFFERENTIAL/PLATELET
Abs Immature Granulocytes: 0.03 10*3/uL (ref 0.00–0.07)
Basophils Absolute: 0 10*3/uL (ref 0.0–0.1)
Basophils Relative: 0 %
Eosinophils Absolute: 0.2 10*3/uL (ref 0.0–0.5)
Eosinophils Relative: 2 %
HCT: 39.7 % (ref 36.0–46.0)
Hemoglobin: 12.4 g/dL (ref 12.0–15.0)
Immature Granulocytes: 0 %
Lymphocytes Relative: 30 %
Lymphs Abs: 2.3 10*3/uL (ref 0.7–4.0)
MCH: 30.5 pg (ref 26.0–34.0)
MCHC: 31.2 g/dL (ref 30.0–36.0)
MCV: 97.5 fL (ref 80.0–100.0)
Monocytes Absolute: 0.7 10*3/uL (ref 0.1–1.0)
Monocytes Relative: 9 %
Neutro Abs: 4.6 10*3/uL (ref 1.7–7.7)
Neutrophils Relative %: 59 %
Platelets: 176 10*3/uL (ref 150–400)
RBC: 4.07 MIL/uL (ref 3.87–5.11)
RDW: 12.4 % (ref 11.5–15.5)
WBC: 7.8 10*3/uL (ref 4.0–10.5)
nRBC: 0 % (ref 0.0–0.2)

## 2022-08-18 LAB — MAGNESIUM: Magnesium: 2.2 mg/dL (ref 1.7–2.4)

## 2022-08-18 LAB — PHOSPHORUS: Phosphorus: 4.5 mg/dL (ref 2.5–4.6)

## 2022-08-18 MED ORDER — ORAL CARE MOUTH RINSE: 15.0000 mL | OROMUCOSAL | Status: DC | PRN

## 2022-08-18 NOTE — Progress Notes (Signed)
Physical Therapy Treatment Patient Details Name: Christina Perkins MRN: [id number] DOB: [birth year] Today's Date: 08/18/2022   History of Present Illness Pt is an 81yo female presenting to [facility] ED on 9/27 with son reporting weakness. CT head no acute findings in ED. Rapid called on 9/28 at 1236 secondary to facial droop. 2nd Rapid called 9/28 1530 nonresponsive to voice only sternal rub. 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 Urinalysis revelead EColi UTI. PMH: Alzheimer's, HTN, CKD3.    PT Comments    Pt is slowly progressing toward acute PT goals, able to perform sit to stand and step pivot transfer with MOD A+2, requiring intermittent facilitation for lateral weight shifting to offload LEs prior to stepping. Discussed with pt, family, and RN on positional changes in bed for pressure relief. Pt will benefit from continued skilled PT to increase their independence and maximize safety with mobility.       Recommendations for follow up therapy are one component of a multi-disciplinary discharge planning process, led by the attending physician.  Recommendations may be updated based on patient status, additional functional criteria and insurance authorization.  Follow Up Recommendations  Skilled nursing-short term rehab (<3 hours/day) Can patient physically be transported by private vehicle: No   Assistance Recommended at Discharge Frequent or constant Supervision/Assistance  Patient can return home with the following Two people to help with walking and/or transfers;Two people to help with bathing/dressing/bathroom;Assistance with cooking/housework;Assistance with feeding;Direct supervision/assist for financial management;Direct supervision/assist for medications management;Help with stairs or ramp for entrance;Assist for transportation   Equipment Recommendations  Other (comment) (TBD)    Recommendations for Other Services       Precautions / Restrictions Precautions Precautions:  Fall Restrictions Weight Bearing Restrictions: No     Mobility  Bed Mobility Overal bed mobility: Needs Assistance Bed Mobility: Supine to Sit     Supine to sit: Max assist, +2 for physical assistance, +2 for safety/equipment, HOB elevated     General bed mobility comments: Max assist +2 using chuck pad and "helicopter method." Pt able to use L UE on bedrail to attempt to assist.    Transfers Overall transfer level: Needs assistance Equipment used: 2 person hand held assist Transfers: Sit to/from Stand, Bed to chair/wheelchair/BSC Sit to Stand: Mod assist, +2 physical assistance, +2 safety/equipment           General transfer comment: Pt required mod assist +2 for power up to stand. Pt soiled through purewick and sheets soiled upon entry. Pt able to stand for prolonged period for assist with pericare. Required seated rest break due to fatigue prior to performing transfer to chair. Required intermittent facilitation for lateral weight shifitng with taking steps to recliner.    Ambulation/Gait                   Stairs             Wheelchair Mobility    Modified Rankin (Stroke Patients Only)       Balance Overall balance assessment: Needs assistance Sitting-balance support: Feet supported, No upper extremity supported Sitting balance-Leahy Scale: Good     Standing balance support: Bilateral upper extremity supported, During functional activity, Reliant on assistive device for balance Standing balance-Leahy Scale: Zero                              Cognition Arousal/Alertness: Lethargic Behavior During Therapy: WFL for tasks assessed/performed Overall Cognitive Status: History of cognitive  impairments - at baseline                                 General Comments: pt's son reports that she has had some noted improvements in cognitiion since admission        Exercises      General Comments        Pertinent  Vitals/Pain Pain Assessment Pain Assessment: Faces Faces Pain Scale: Hurts even more Pain Location: lower back and SIJ R>L Pain Descriptors / Indicators: Sore, Discomfort, Grimacing Pain Intervention(s): Limited activity within patient's tolerance, Monitored during session, Repositioned    Home Living                          Prior Function            PT Goals (current goals can now be found in the care plan section) Acute Rehab PT Goals Patient Stated Goal: Less pain PT Goal Formulation: With patient/family Time For Goal Achievement: 08/29/22 Potential to Achieve Goals: Fair Progress towards PT goals: Progressing toward goals    Frequency    Min 2X/week      PT Plan Current plan remains appropriate    Co-evaluation              AM-PAC PT "6 Clicks" Mobility   Outcome Measure  Help needed turning from your back to your side while in a flat bed without using bedrails?: A Lot Help needed moving from lying on your back to sitting on the side of a flat bed without using bedrails?: Total Help needed moving to and from a bed to a chair (including a wheelchair)?: Total Help needed standing up from a chair using your arms (e.g., wheelchair or bedside chair)?: Total Help needed to walk in hospital room?: Total Help needed climbing 3-5 steps with a railing? : Total 6 Click Score: 7    End of Session Equipment Utilized During Treatment: Gait belt Activity Tolerance: Patient tolerated treatment well Patient left: in chair;with call bell/phone within reach;with chair alarm set;with family/visitor present Nurse Communication: Mobility status PT Visit Diagnosis: Pain;Difficulty in walking, not elsewhere classified (R26.2);Muscle weakness (generalized) (M62.81) Pain - Right/Left:  (mid/lower) Pain - part of body: Hip (lower back)     Time: 6301-6010 PT Time Calculation (min) (ACUTE ONLY): 21 min  Charges:  $Therapeutic Activity: 8-22 mins                      Festus Barren PT, DPT  Acute Rehabilitation Services  Office [phone]   08/18/2022, 1:30 PM

## 2022-08-18 NOTE — TOC Progression Note (Signed)
Transition of Care [facility]) - Progression Note    Patient Details  Name: Christina Perkins MRN: [id number] Date of Birth: 09/19/1941  Transition of Care [facility]) CM/SW Contact  Ross Ludwig, [place] Phone Number: 08/18/2022, 6:16 PM  Clinical Narrative:    Patient has been approved for SNF placement at [facility], authorization id [id number], start date of 08/19/2022, next review 08/21/2022.   Expected Discharge Plan: [place] Barriers to Discharge: Continued Medical Work up  Expected Discharge Plan and Services Expected Discharge Plan: [place]   Discharge Planning Services: CM Consult   Living arrangements for the past 2 months: Single Family Home                                       Social Determinants of Health (SDOH) Interventions    Readmission Risk Interventions     No data to display

## 2022-08-18 NOTE — TOC Progression Note (Signed)
Transition of Care [facility]) - Progression Note    Patient Details  Name: Christina Perkins MRN: [id number] Date of Birth: Jul 30, 1941  Transition of Care [facility]) CM/SW Contact  Lennart Pall, LCSW Phone Number: 08/18/2022, 3:31 PM  Clinical Narrative:    Have reviewed SNF bed offers today with son and they have accepted bed at [facility] who can admit pt tomorrow.  Insurance auth begun.   Expected Discharge Plan: [place] Barriers to Discharge: Continued Medical Work up  Expected Discharge Plan and Services Expected Discharge Plan: [place]   Discharge Planning Services: CM Consult   Living arrangements for the past 2 months: Single Family Home                                       Social Determinants of Health (SDOH) Interventions    Readmission Risk Interventions     No data to display

## 2022-08-18 NOTE — Progress Notes (Signed)
Physical Therapy Treatment Patient Details Name: Christina Perkins MRN: [id number] DOB: [birth year] Today's Date: 08/18/2022   History of Present Illness Pt is an 81yo female presenting to [facility] ED on 9/27 with son reporting weakness. CT head no acute findings in ED. Rapid called on 9/28 at 1236 secondary to facial droop. 2nd Rapid called 9/28 1530 nonresponsive to voice only sternal rub. 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 Urinalysis revelead EColi UTI. PMH: Alzheimer's, HTN, CKD3.    PT Comments    Pt OOB in recliner calling out for help, PT and rehab tech arrived and queried pt who requested to go to the bathroom. Pt required mod assist +2 for transfers using RW to/from recliner to [facility] and back, pt unable to move BLE much more than shuffling feet across floor, required PT facilitation and multimodal cuing. Pt able to stand for pericare and seems more cognitively aware than previous session. Repositioned pt for comfort with multiple pillows in recliner, RN notified of pt's output. We will continue to follow acutely.          08/18/22 1428  PT Visit Information  Last PT Received On 08/18/22  Assistance Needed +2  History of Present Illness Pt is an 81yo female presenting to [facility] ED on 9/27 with son reporting weakness. CT head no acute findings in ED. Rapid called on 9/28 at 1236 secondary to facial droop. 2nd Rapid called 9/28 1530 nonresponsive to voice only sternal rub. 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 Urinalysis revelead EColi UTI. PMH: Alzheimer's, HTN, CKD3.  Subjective Data  Subjective I need to go to the bathroom  Patient Stated Goal Less pain  Precautions  Precautions Fall  Restrictions  Weight Bearing Restrictions No  Pain Assessment  Pain Assessment Faces  Faces Pain Scale 6  Breathing 0  Negative Vocalization 0  Facial Expression 0  Body Language 0  Consolability 0  PAINAD Score 0  Pain Location lower back and SIJ R>L  Pain Descriptors /  Indicators Sore;Discomfort;Grimacing  Pain Intervention(s) Monitored during session;Repositioned  Cognition  Arousal/Alertness Lethargic  Behavior During Therapy WFL for tasks assessed/performed  Overall Cognitive Status History of cognitive impairments - at baseline  Transfers  Overall transfer level Needs assistance  Equipment used Rolling walker (2 wheels)  Transfers Sit to/from Stand;Bed to chair/wheelchair/BSC  Sit to Stand Mod assist;+2 physical assistance;+2 safety/equipment  Bed to/from chair/wheelchair/BSC transfer type: Step pivot  Squat pivot transfers +2 safety/equipment;Mod assist;+2 physical assistance  General transfer comment Pt required mod assist +2 for power up to stand, mod assist +2 for step pivot transfer for facilitation of BLE and AD management, pt with very very small steps barely lifting feet off floor, more shuffling requiring PT assistance. Pt and able to stand for pericare. Returned to recliner with additional pillows for comfort.  Ambulation/Gait  General Gait Details Too painful for pt at present.  Balance  Overall balance assessment Needs assistance  Sitting-balance support Feet supported;No upper extremity supported  Sitting balance-Leahy Scale Good  Standing balance support Bilateral upper extremity supported;During functional activity;Reliant on assistive device for balance  Standing balance-Leahy Scale Zero  Standing balance comment Pt unable to stand without max/total assistance  PT - End of Session  Equipment Utilized During Treatment Gait belt  Activity Tolerance Patient tolerated treatment well  Patient left in chair;with call bell/phone within reach;with chair alarm set;with family/visitor present  Nurse Communication Mobility status   PT - Assessment/Plan  PT Plan Current plan remains appropriate  PT  Visit Diagnosis Pain;Difficulty in walking, not elsewhere classified (R26.2);Muscle weakness (generalized) (M62.81)  Pain - Right/Left   (mid/lower)  Pain - part of body Hip (lower back)  PT Frequency (ACUTE ONLY) Min 2X/week  Follow Up Recommendations Skilled nursing-short term rehab (<3 hours/day)  Can patient physically be transported by private vehicle No  Assistance recommended at discharge Frequent or constant Supervision/Assistance  Patient can return home with the following Two people to help with walking and/or transfers;Two people to help with bathing/dressing/bathroom;Assistance with cooking/housework;Assistance with feeding;Direct supervision/assist for financial management;Direct supervision/assist for medications management;Help with stairs or ramp for entrance;Assist for transportation  PT equipment Other (comment) (TBD)  AM-PAC PT "6 Clicks" Mobility Outcome Measure (Version 2)  Help needed turning from your back to your side while in a flat bed without using bedrails? 2  Help needed moving from lying on your back to sitting on the side of a flat bed without using bedrails? 1  Help needed moving to and from a bed to a chair (including a wheelchair)? 1  Help needed standing up from a chair using your arms (e.g., wheelchair or bedside chair)? 1  Help needed to walk in hospital room? 1  Help needed climbing 3-5 steps with a railing?  1  6 Click Score 7  Consider Recommendation of Discharge To: CIR/SNF/LTACH  Progressive Mobility  What is the highest level of mobility based on the progressive mobility assessment? Level 3 (Stands with assist) - Balance while standing  and cannot march in place  Mobility Referral No  Activity Transferred to/from [facility]  PT Goal Progression  Progress towards PT goals Progressing toward goals  Acute Rehab PT Goals  PT Goal Formulation With patient/family  Time For Goal Achievement 08/29/22  Potential to Achieve Goals Fair  PT Time Calculation  PT Start Time (ACUTE ONLY) 1424  PT Stop Time (ACUTE ONLY) 1438  PT Time Calculation (min) (ACUTE ONLY) 14 min  PT General Charges  $$  ACUTE PT VISIT 1 Visit  PT Treatments  $Therapeutic Activity 8-22 mins   Coolidge Breeze, PT, DPT [facility] Rehabilitation Department Office: [phone] Weekend pager: [phone]

## 2022-08-18 NOTE — Care Management Important Message (Signed)
Important Message  Patient Details IM Letter placed in Patient's room for Son. Name: Christina Perkins MRN: [id number] Date of Birth: Sep 15, 1941   Medicare Important Message Given:  Yes     Kerin Salen 08/18/2022, 1:14 PM

## 2022-08-18 NOTE — Progress Notes (Signed)
Christina Perkins [id number] DOB: 21-Jun-1941 DOA: 08/13/2022 PCP: Sandrea Hughs, NP   Subj: 81 year old African-American female history of hypertension, CKD stage IIIb, Alzheimer type dementia presents to the ER today with weakness.  Patient brought in by her son.   Per the note, patient had weakness for the past week.  Was being treated for UTI.   Son is not available for interview.  Attempted to call him at [phone].  There was no answer.   No reported fever, chills, nausea, vomiting.   Laboratory evaluation in the ER showed urinalysis with positive nitrates, leukocyte esterase, bacteria, 21-50 WBCs.   White count 8.7, hemoglobin 12.8, platelets 184   BUN of 28, creatinine 1.47   Baseline creatinine approximately 1.3-1.4   Obj: 10/2 A/O x1 (does not know where, when, why).       Objective: VITAL SIGNS: Temp: 98 F (36.7 C) (10/02 0525) BP: 156/83 (10/02 0525) Pulse Rate: 71 (10/02 0525) SPO2; FIO2:   Intake/Output Summary (Last 24 hours) at 08/18/2022 1310 Last data filed at 08/18/2022 0600 Gross per 24 hour  Intake 1789.92 ml  Output 600 ml  Net 1189.92 ml      Exam: General: A/O x1 (does not know where, when, why) No acute respiratory distress Lungs: Clear to auscultation bilaterally without wheezes or crackles Cardiovascular: Regular rate and rhythm without murmur gallop or rub normal S1 and S2 Abdomen: Nontender, nondistended, soft, bowel sounds positive, no rebound, no ascites, no appreciable mass, negative CVA tenderness, negative bladder tenderness. Extremities: No significant cyanosis, clubbing, or edema bilateral lower extremities Skin: Negative rashes, lesions, ulcers Psychiatric:  Negative depression, negative anxiety, negative fatigue, negative mania  Central nervous system:  Cranial nerves II through XII intact, negative dysarthria, negative expressive aphasia, negative receptive aphasia.  .  Mobility Assessment (last 72 hours)      Mobility Assessment     Row Name 08/18/22 1000 08/17/22 1110 08/16/22 1041 08/15/22 2025 08/15/22 1349   Does patient have an order for bedrest or is patient medically unstable No - Continue assessment No - Continue assessment No - Continue assessment No - Continue assessment --   What is the highest level of mobility based on the progressive mobility assessment? Level 3 (Stands with assist) - Balance while standing  and cannot march in place Level 2 (Chairfast) - Balance while sitting on edge of bed and cannot stand Level 2 (Chairfast) - Balance while sitting on edge of bed and cannot stand Level 2 (Chairfast) - Balance while sitting on edge of bed and cannot stand Level 3 (Stands with assist) - Balance while standing  and cannot march in place   Is the above level different from baseline mobility prior to current illness? Yes - Recommend PT order -- -- Yes - Recommend PT order --              DVT prophylaxis:  Code Status: Full Family Communication: 10/1 son at bedside for discussion of plan of care all questions answered Status is: Inpatient    Dispo: The patient is from: [facility] care               Anticipated d/c is to: [facility] d/c date is: 3 days              Patient currently is not medically stable to d/c.    Procedures/Significant Events: 9/28 MRI Brain negative for acute  CVA.  Old brainstem infarct.   Consultants:     Cultures 9/27 urine positive E. Coli   Antimicrobials: Anti-infectives (From admission, onward)    Start     Ordered Stop   08/14/22 1100  cefTRIAXone (ROCEPHIN) 1 g in sodium chloride 0.9 % 100 mL IVPB        08/13/22 2150     08/13/22 1100  cefTRIAXone (ROCEPHIN) 1 g in sodium chloride 0.9 % 100 mL IVPB        08/13/22 1052 08/13/22 1137        A/P   Acute cystitis without hematuria/positive E. 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 Follows with outpatient neurology in [facility].   Essential HTN -Currently not on maintenance HTN meds as outpatient.   Generalized weakness -9/29 PT recommends SNF. -9/30 consult TOC SNF placement   Anorexia/failure to thrive -10/1 poor intake D5-0.9% saline 48ml/hr       Care during the described time interval was provided by me .  I have reviewed this patient's available data, including medical history, events of note, physical examination, and all test results as part of my evaluation.

## 2022-08-19 DIAGNOSIS — R627 Adult failure to thrive: Secondary | ICD-10-CM | POA: Diagnosis not present

## 2022-08-19 DIAGNOSIS — W19XXXA Unspecified fall, initial encounter: Secondary | ICD-10-CM | POA: Diagnosis not present

## 2022-08-19 DIAGNOSIS — Z9181 History of falling: Secondary | ICD-10-CM | POA: Diagnosis not present

## 2022-08-19 DIAGNOSIS — Z029 Encounter for administrative examinations, unspecified: Secondary | ICD-10-CM | POA: Diagnosis not present

## 2022-08-19 DIAGNOSIS — M25561 Pain in right knee: Secondary | ICD-10-CM | POA: Diagnosis not present

## 2022-08-19 DIAGNOSIS — F02818 Dementia in other diseases classified elsewhere, unspecified severity, with other behavioral disturbance: Secondary | ICD-10-CM | POA: Diagnosis not present

## 2022-08-19 DIAGNOSIS — G4700 Insomnia, unspecified: Secondary | ICD-10-CM | POA: Diagnosis not present

## 2022-08-19 DIAGNOSIS — N390 Urinary tract infection, site not specified: Secondary | ICD-10-CM | POA: Diagnosis not present

## 2022-08-19 DIAGNOSIS — R7989 Other specified abnormal findings of blood chemistry: Secondary | ICD-10-CM | POA: Diagnosis not present

## 2022-08-19 DIAGNOSIS — D72829 Elevated white blood cell count, unspecified: Secondary | ICD-10-CM | POA: Diagnosis not present

## 2022-08-19 DIAGNOSIS — G309 Alzheimer's disease, unspecified: Secondary | ICD-10-CM | POA: Diagnosis not present

## 2022-08-19 DIAGNOSIS — S92152A Displaced avulsion fracture (chip fracture) of left talus, initial encounter for closed fracture: Secondary | ICD-10-CM | POA: Diagnosis not present

## 2022-08-19 DIAGNOSIS — S80919A Unspecified superficial injury of unspecified knee, initial encounter: Secondary | ICD-10-CM | POA: Diagnosis not present

## 2022-08-19 DIAGNOSIS — Z743 Need for continuous supervision: Secondary | ICD-10-CM | POA: Diagnosis not present

## 2022-08-19 DIAGNOSIS — R630 Anorexia: Secondary | ICD-10-CM

## 2022-08-19 DIAGNOSIS — R1310 Dysphagia, unspecified: Secondary | ICD-10-CM | POA: Diagnosis not present

## 2022-08-19 DIAGNOSIS — N1832 Chronic kidney disease, stage 3b: Secondary | ICD-10-CM | POA: Diagnosis not present

## 2022-08-19 DIAGNOSIS — R404 Transient alteration of awareness: Secondary | ICD-10-CM | POA: Diagnosis not present

## 2022-08-19 DIAGNOSIS — I679 Cerebrovascular disease, unspecified: Secondary | ICD-10-CM | POA: Diagnosis not present

## 2022-08-19 DIAGNOSIS — R296 Repeated falls: Secondary | ICD-10-CM | POA: Diagnosis not present

## 2022-08-19 DIAGNOSIS — M6281 Muscle weakness (generalized): Secondary | ICD-10-CM | POA: Diagnosis not present

## 2022-08-19 DIAGNOSIS — M7989 Other specified soft tissue disorders: Secondary | ICD-10-CM | POA: Diagnosis not present

## 2022-08-19 DIAGNOSIS — R262 Difficulty in walking, not elsewhere classified: Secondary | ICD-10-CM | POA: Diagnosis not present

## 2022-08-19 DIAGNOSIS — N3000 Acute cystitis without hematuria: Secondary | ICD-10-CM | POA: Diagnosis not present

## 2022-08-19 DIAGNOSIS — S93412A Sprain of calcaneofibular ligament of left ankle, initial encounter: Secondary | ICD-10-CM | POA: Diagnosis not present

## 2022-08-19 DIAGNOSIS — M25452 Effusion, left hip: Secondary | ICD-10-CM | POA: Diagnosis not present

## 2022-08-19 DIAGNOSIS — R531 Weakness: Secondary | ICD-10-CM | POA: Diagnosis not present

## 2022-08-19 DIAGNOSIS — R2689 Other abnormalities of gait and mobility: Secondary | ICD-10-CM | POA: Diagnosis not present

## 2022-08-19 DIAGNOSIS — F0390 Unspecified dementia without behavioral disturbance: Secondary | ICD-10-CM | POA: Diagnosis not present

## 2022-08-19 DIAGNOSIS — R600 Localized edema: Secondary | ICD-10-CM | POA: Diagnosis not present

## 2022-08-19 DIAGNOSIS — M25552 Pain in left hip: Secondary | ICD-10-CM | POA: Diagnosis not present

## 2022-08-19 DIAGNOSIS — M533 Sacrococcygeal disorders, not elsewhere classified: Secondary | ICD-10-CM | POA: Diagnosis not present

## 2022-08-19 DIAGNOSIS — I129 Hypertensive chronic kidney disease with stage 1 through stage 4 chronic kidney disease, or unspecified chronic kidney disease: Secondary | ICD-10-CM | POA: Diagnosis not present

## 2022-08-19 DIAGNOSIS — M1612 Unilateral primary osteoarthritis, left hip: Secondary | ICD-10-CM | POA: Diagnosis not present

## 2022-08-19 DIAGNOSIS — I700 Atherosclerosis of aorta: Secondary | ICD-10-CM | POA: Diagnosis not present

## 2022-08-19 DIAGNOSIS — G301 Alzheimer's disease with late onset: Secondary | ICD-10-CM | POA: Diagnosis not present

## 2022-08-19 DIAGNOSIS — J984 Other disorders of lung: Secondary | ICD-10-CM | POA: Diagnosis not present

## 2022-08-19 DIAGNOSIS — R5381 Other malaise: Secondary | ICD-10-CM | POA: Diagnosis not present

## 2022-08-19 DIAGNOSIS — Z043 Encounter for examination and observation following other accident: Secondary | ICD-10-CM | POA: Diagnosis not present

## 2022-08-19 DIAGNOSIS — Z87440 Personal history of urinary (tract) infections: Secondary | ICD-10-CM | POA: Diagnosis not present

## 2022-08-19 DIAGNOSIS — Z8673 Personal history of transient ischemic attack (TIA), and cerebral infarction without residual deficits: Secondary | ICD-10-CM | POA: Diagnosis not present

## 2022-08-19 DIAGNOSIS — S99912A Unspecified injury of left ankle, initial encounter: Secondary | ICD-10-CM | POA: Diagnosis present

## 2022-08-19 DIAGNOSIS — I10 Essential (primary) hypertension: Secondary | ICD-10-CM | POA: Diagnosis not present

## 2022-08-19 DIAGNOSIS — R8271 Bacteriuria: Secondary | ICD-10-CM | POA: Diagnosis not present

## 2022-08-19 DIAGNOSIS — Z8679 Personal history of other diseases of the circulatory system: Secondary | ICD-10-CM | POA: Diagnosis not present

## 2022-08-19 DIAGNOSIS — E785 Hyperlipidemia, unspecified: Secondary | ICD-10-CM | POA: Diagnosis not present

## 2022-08-19 DIAGNOSIS — S82892A Other fracture of left lower leg, initial encounter for closed fracture: Secondary | ICD-10-CM | POA: Diagnosis not present

## 2022-08-19 DIAGNOSIS — R601 Generalized edema: Secondary | ICD-10-CM | POA: Diagnosis not present

## 2022-08-19 DIAGNOSIS — I7771 Dissection of carotid artery: Secondary | ICD-10-CM | POA: Diagnosis not present

## 2022-08-19 DIAGNOSIS — Z7401 Bed confinement status: Secondary | ICD-10-CM | POA: Diagnosis not present

## 2022-08-19 DIAGNOSIS — M7750 Other enthesopathy of unspecified foot: Secondary | ICD-10-CM | POA: Diagnosis not present

## 2022-08-19 DIAGNOSIS — I150 Renovascular hypertension: Secondary | ICD-10-CM | POA: Diagnosis not present

## 2022-08-19 DIAGNOSIS — R52 Pain, unspecified: Secondary | ICD-10-CM | POA: Diagnosis not present

## 2022-08-19 LAB — CBC WITH DIFFERENTIAL/PLATELET
Abs Immature Granulocytes: 0.02 10*3/uL (ref 0.00–0.07)
Basophils Absolute: 0 10*3/uL (ref 0.0–0.1)
Basophils Relative: 1 %
Eosinophils Absolute: 0.1 10*3/uL (ref 0.0–0.5)
Eosinophils Relative: 2 %
HCT: 37.3 % (ref 36.0–46.0)
Hemoglobin: 11.7 g/dL — ABNORMAL LOW (ref 12.0–15.0)
Immature Granulocytes: 0 %
Lymphocytes Relative: 29 %
Lymphs Abs: 2.1 10*3/uL (ref 0.7–4.0)
MCH: 30.5 pg (ref 26.0–34.0)
MCHC: 31.4 g/dL (ref 30.0–36.0)
MCV: 97.1 fL (ref 80.0–100.0)
Monocytes Absolute: 0.8 10*3/uL (ref 0.1–1.0)
Monocytes Relative: 11 %
Neutro Abs: 4.2 10*3/uL (ref 1.7–7.7)
Neutrophils Relative %: 57 %
Platelets: 177 10*3/uL (ref 150–400)
RBC: 3.84 MIL/uL — ABNORMAL LOW (ref 3.87–5.11)
RDW: 12.6 % (ref 11.5–15.5)
WBC: 7.2 10*3/uL (ref 4.0–10.5)
nRBC: 0 % (ref 0.0–0.2)

## 2022-08-19 LAB — COMPREHENSIVE METABOLIC PANEL
ALT: 39 U/L (ref 0–44)
AST: 41 U/L (ref 15–41)
Albumin: 2.9 g/dL — ABNORMAL LOW (ref 3.5–5.0)
Alkaline Phosphatase: 61 U/L (ref 38–126)
Anion gap: 5 (ref 5–15)
BUN: 15 mg/dL (ref 8–23)
CO2: 26 mmol/L (ref 22–32)
Calcium: 8.8 mg/dL — ABNORMAL LOW (ref 8.9–10.3)
Chloride: 112 mmol/L — ABNORMAL HIGH (ref 98–111)
Creatinine, Ser: 0.98 mg/dL (ref 0.44–1.00)
GFR, Estimated: 58 mL/min — ABNORMAL LOW (ref >60)
Glucose, Bld: 113 mg/dL — ABNORMAL HIGH (ref 70–99)
Potassium: 4.3 mmol/L (ref 3.5–5.1)
Sodium: 143 mmol/L (ref 135–145)
Total Bilirubin: 0.5 mg/dL (ref 0.3–1.2)
Total Protein: 5.7 g/dL — ABNORMAL LOW (ref 6.5–8.1)

## 2022-08-19 LAB — MAGNESIUM: Magnesium: 2.1 mg/dL (ref 1.7–2.4)

## 2022-08-19 LAB — PHOSPHORUS: Phosphorus: 3.8 mg/dL (ref 2.5–4.6)

## 2022-08-19 MED ORDER — ONDANSETRON HCL 4 MG PO TABS: 4.0000 mg | ORAL_TABLET | Freq: Four times a day (QID) | ORAL | 0 refills | Status: DC | PRN

## 2022-08-19 MED ORDER — CLONIDINE HCL 0.1 MG PO TABS: 0.1000 mg | ORAL_TABLET | ORAL | 0 refills | Status: DC | PRN

## 2022-08-19 MED ORDER — MELATONIN 10 MG PO TABS: 10.0000 mg | ORAL_TABLET | Freq: Every evening | ORAL | 0 refills | Status: AC | PRN

## 2022-08-19 MED ORDER — MIRTAZAPINE 15 MG PO TABS: 15.0000 mg | ORAL_TABLET | Freq: Every day | ORAL | 0 refills | Status: AC

## 2022-08-19 MED ORDER — MIRTAZAPINE 15 MG PO TABS: 15.0000 mg | ORAL_TABLET | Freq: Every day | ORAL | Status: DC

## 2022-08-19 MED ORDER — LABETALOL HCL 100 MG PO TABS: 100.0000 mg | ORAL_TABLET | Freq: Two times a day (BID) | ORAL | 0 refills | Status: AC

## 2022-08-19 NOTE — NC FL2 (Signed)
[place] LEVEL OF CARE SCREENING TOOL     IDENTIFICATION  Patient Name: Christina Perkins Birthdate: February 04, 1941 Sex: female Admission Date (Current Location): 08/13/2022  [facility] [place] and [place] Number:  Herbalist and Address:  [facility],  [address], [place]      Provider Number: [id number]  Attending Physician Name and Address:  Allie Bossier, MD  Relative Name and Phone Number:       Current Level of Care: Hospital Recommended Level of Care: [place] Prior Approval Number:    Date Approved/Denied:   PASRR Number: [id number]  Discharge Plan: SNF    Current Diagnoses: Patient Active Problem List   Diagnosis Date Noted   Failure to thrive in adult 08/17/2022   Anorexia 08/17/2022   UTI (urinary tract infection) 08/14/2022   E. coli UTI 08/14/2022   Acute cystitis without hematuria 08/13/2022   Stage 3b chronic kidney disease (CKD) ([place]) 08/13/2022   Acute bilateral low back pain without sciatica 11/08/2020   Late onset Alzheimer's dementia without behavioral disturbance ([place]) 08/28/2020   Malnutrition of moderate degree 07/19/2020   Weakness 07/18/2020   Generalized weakness 07/18/2020   Fall at home, initial encounter 07/18/2020   Avascular necrosis of femoral head ([place]) 07/18/2020   Essential hypertension 07/18/2020   Injury of toe on right foot 11/07/2013   Pain in toe of right foot 11/07/2013    Orientation RESPIRATION BLADDER Height & Weight     Self  Normal Incontinent Weight: 146 lb 6.2 oz (66.4 kg) Height:  5\' 7"  (170.2 cm)  BEHAVIORAL SYMPTOMS/MOOD NEUROLOGICAL BOWEL NUTRITION STATUS      Incontinent    AMBULATORY STATUS COMMUNICATION OF NEEDS Skin   Extensive Assist Verbally Normal                       Personal Care Assistance Level of Assistance  Bathing, Dressing Bathing Assistance: Limited assistance   Dressing Assistance: Limited assistance     Functional  Limitations Info  Hearing   Hearing Info: Impaired      SPECIAL CARE FACTORS FREQUENCY  PT (By licensed PT), OT (By licensed OT)     PT Frequency: 5x/wk OT Frequency: 5x/wk            Contractures Contractures Info: Not present    Additional Factors Info  Code Status, Allergies, Psychotropic Code Status Info: Full Allergies Info: NKDA Psychotropic Info: see MAR         Current Medications (08/19/2022):  This is the current hospital active medication list Current Facility-Administered Medications  Medication Dose Route Frequency Provider Last Rate Last Admin   0.9 %  sodium chloride infusion   Intravenous PRN Allie Bossier, MD   Stopped at 08/16/22 1227   acetaminophen (TYLENOL) tablet 650 mg  650 mg Oral Q6H PRN Kristopher Oppenheim, DO   650 mg at 08/19/22 0210   Or   acetaminophen (TYLENOL) suppository 650 mg  650 mg Rectal Q6H PRN Kristopher Oppenheim, DO       atorvastatin (LIPITOR) tablet 10 mg  10 mg Oral QHS Kristopher Oppenheim, DO   10 mg at 08/19/22 0211   cholecalciferol (VITAMIN D3) 25 MCG (1000 UNIT) tablet 1,000 Units  1,000 Units Oral Daily Kristopher Oppenheim, DO   1,000 Units at 08/19/22 [id number]   cloNIDine (CATAPRES) tablet 0.1 mg  0.1 mg Oral Q4H PRN Kristopher Oppenheim, DO   0.1 mg at 08/14/22 0254   dextrose  5 %-0.9 % sodium chloride infusion   Intravenous Continuous Allie Bossier, MD 75 mL/hr at 08/18/22 1739 New Bag at 08/18/22 1739   donepezil (ARICEPT) tablet 10 mg  10 mg Oral QHS Kristopher Oppenheim, DO   10 mg at 08/19/22 1610   feeding supplement (ENSURE ENLIVE / ENSURE PLUS) liquid 237 mL  237 mL Oral Daily Kristopher Oppenheim, DO   237 mL at 08/19/22 0843   heparin injection 5,000 Units  5,000 Units Subcutaneous Q8H Kristopher Oppenheim, DO   5,000 Units at 08/19/22 9604   influenza vaccine adjuvanted (FLUAD) injection 0.5 mL  0.5 mL Intramuscular Prior to discharge Kristopher Oppenheim, DO       labetalol (NORMODYNE) tablet 100 mg  100 mg Oral BID Kristopher Oppenheim, DO   100 mg at 08/19/22 5409   melatonin tablet 10 mg  10 mg Oral  QHS PRN Kristopher Oppenheim, DO       memantine [facility]) tablet 10 mg  10 mg Oral BID Kristopher Oppenheim, DO   10 mg at 08/19/22 8119   mirtazapine (REMERON) tablet 15 mg  15 mg Oral QHS Allie Bossier, MD       ondansetron [facility]) tablet 4 mg  4 mg Oral Q6H PRN Kristopher Oppenheim, DO       Or   ondansetron [facility]) injection 4 mg  4 mg Intravenous Q6H PRN Kristopher Oppenheim, DO       Oral care mouth rinse  15 mL Mouth Rinse PRN Allie Bossier, MD         Discharge Medications: Please see discharge summary for a list of discharge medications.  Relevant Imaging Results:  Relevant Lab Results:   Additional Information Pfizer covid vaccines 12/29/2019 and 01/23/2020. SS# [id number]  Lennart Pall, LCSW

## 2022-08-19 NOTE — TOC Transition Note (Signed)
Transition of Care [facility]) - CM/SW Discharge Note   Patient Details  Name: Christina Perkins MRN: [id number] Date of Birth: 1941-10-22  [facility] CM/SW Contact:  Lennart Pall, LCSW Phone Number: 08/19/2022, 1:01 PM   Clinical Narrative:     Have received insurance authorization and medical clearance for pt to dc today to [facility].  PTAR called at 12:50pm.  RN to call report to [phone].  Pt and son aware/ agreeable.  Final next level of care: Skilled Nursing Facility Barriers to Discharge: Barriers Resolved   Patient Goals and CMS Choice Patient states their goals for this hospitalization and ongoing recovery are:: per son - snf      Discharge Placement   Existing PASRR number confirmed : 08/19/22          Patient [facility] bed at: [facility] Patient to be transferred to facility by: [place] Name of family member notified: son, Gwyndolyn Saxon Patient and family notified of of transfer: 08/19/22  Discharge Plan and Services   Discharge Planning Services: CM Consult            DME Arranged: N/A DME Agency: NA                  Social Determinants of Health ([place]) Interventions     Readmission Risk Interventions    08/19/2022   12:49 PM  Readmission Risk Prevention Plan  Transportation Screening Complete  PCP or Specialist Appt within 5-7 Days Complete  Home Care Screening Complete  Medication Review (RN CM) Complete

## 2022-08-19 NOTE — Discharge Summary (Signed)
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 Follows with outpatient neurology in [facility].   Essential HTN -Currently not on maintenance HTN meds as outpatient.   Generalized weakness -9/29 PT recommends SNF. -9/30 consult TOC SNF placement     Anorexia/failure to thrive -Remeron 7.5 mg qhs    Discharge Diagnoses:  Principal Problem:   Acute cystitis without hematuria Active Problems:   Generalized weakness   Essential hypertension   Late onset Alzheimer's dementia without behavioral disturbance ([facility])   Stage 3b chronic kidney disease (CKD) (H[facility])   UTI (urinary tract infection)   E. coli UTI   Failure to thrive in adult   Anorexia   Discharge Condition: Stable  Diet recommendation: Heart healthy  Filed Weights   08/16/22 0452 08/17/22 0423 08/18/22 0500  Weight: 70.8 kg 68.3 kg 66.4 kg    History of present illness:  81 year old African-American female history of hypertension, CKD stage IIIb, Alzheimer type dementia presents to the ER today with weakness.  Patient brought in by her son.   Per the note, patient had weakness for the past week.  Was being treated for UTI.   Son is not available for interview.  Attempted to call him at [phone].  There was no answer.   No reported fever, chills, nausea, vomiting.   Laboratory evaluation in the ER showed urinalysis with positive nitrates, leukocyte esterase, bacteria, 21-50 WBCs.   White count 8.7, hemoglobin 12.8, platelets 184   BUN of 28, creatinine 1.47   Baseline creatinine approximately 1.3-1.4  Hospital Course:  See above  Procedures: 9/28 MRI Brain negative for acute CVA.  Old brainstem  infarct.  Cultures  9/27 urine positive E. Coli  Antibiotics Anti-infectives (From admission, onward)    Start     Ordered Stop   08/14/22 1100  cefTRIAXone (ROCEPHIN) 1 g in sodium chloride 0.9 % 100 mL IVPB        08/13/22 2150 08/18/22 1130   08/13/22 1100  cefTRIAXone (ROCEPHIN) 1 g in sodium chloride 0.9 % 100 mL IVPB        08/13/22 1052 08/13/22 1137         Discharge Exam: Vitals:   08/18/22 0525 08/18/22 1311 08/18/22 2058 08/19/22 0546  BP: (!) 156/83 128/73 116/67 137/72  Pulse: 71 77 73 71  Resp: 18 14 17 16   Temp: 98 F (36.7 C) 98.6 F (37 C) 98.7 F (37.1 C) 97.8 F (36.6 C)  TempSrc:  Oral    SpO2: 100% 98% 97% 99%  Weight:      Height:        General: A/O x1 (does not know where, when, why) No acute respiratory distress Lungs: Clear to auscultation bilaterally without wheezes or crackles Cardiovascular: Regular rate and rhythm without murmur gallop or rub normal S1 and S2  Discharge Instructions   Allergies as of 08/19/2022   No Known Allergies      Medication List     STOP taking these medications    furosemide 20 MG tablet Commonly known as: Lasix   meloxicam 7.5 MG tablet Commonly known as: MOBIC       TAKE these medications    acetaminophen 500 MG tablet Commonly known as: TYLENOL Take 2 tablets (  1,000 mg total) by mouth in the morning and at bedtime.   atorvastatin 10 MG tablet Commonly known as: LIPITOR TAKE 1 TABLET BY MOUTH AT  BEDTIME   cholecalciferol 25 MCG (1000 UNIT) tablet Commonly known as: VITAMIN D3 Take 1,000 Units by mouth daily.   cloNIDine 0.1 MG tablet Commonly known as: CATAPRES Take 1 tablet (0.1 mg total) by mouth every 4 (four) hours as needed (SBP >170 or DBP > 100).   donepezil 10 MG tablet Commonly known as: ARICEPT Take 10 mg by mouth at bedtime. Before Bedtime.   Ensure Take 237 mLs by mouth daily.   labetalol 100 MG tablet Commonly known as: NORMODYNE Take 1 tablet (100 mg total) by  mouth 2 (two) times daily.   Melatonin 10 MG Tabs Take 10 mg by mouth at bedtime as needed.   memantine 10 MG tablet Commonly known as: NAMENDA Take 10 mg by mouth 2 (two) times daily.   mirtazapine 15 MG tablet Commonly known as: REMERON Take 1 tablet (15 mg total) by mouth at bedtime. What changed:  medication strength how much to take   ondansetron 4 MG tablet Commonly known as: ZOFRAN Take 1 tablet (4 mg total) by mouth every 6 (six) hours as needed for nausea.       No Known Allergies  Contact information for after-discharge care     Destination     [facility] Preferred SNF .   Service: Skilled Nursing Contact information: [place] [phone]                      The results of significant diagnostics from this hospitalization (including imaging, microbiology, ancillary and laboratory) are listed below for reference.    Significant Diagnostic Studies: MR BRAIN WO CONTRAST  Result Date: 08/14/2022 CLINICAL DATA:  Suspect stroke EXAM: MRI HEAD WITHOUT CONTRAST TECHNIQUE: Multiplanar, multiecho pulse sequences of the brain and surrounding structures were obtained without intravenous contrast. COMPARISON:  CT head 08/13/2022 FINDINGS: Brain: No acute infarction, hemorrhage, hydrocephalus, extra-axial collection or mass lesion. Mild white matter changes with small deep white matter hyperintensities bilaterally. Brainstem intact. Vascular: Normal arterial flow voids Skull and upper cervical spine: No focal lesion. Sinuses/Orbits: Paranasal sinuses clear.  Negative orbit Other: None IMPRESSION: No acute abnormality. Mild white matter changes consistent with chronic microvascular ischemia. Electronically Signed   By: Franchot Gallo M.D.   On: 08/14/2022 17:08   CT Head Wo Contrast  Result Date: 08/13/2022 CLINICAL DATA:  Delirium.  Weakness. EXAM: CT HEAD WITHOUT CONTRAST TECHNIQUE: Contiguous axial images were  obtained from the base of the skull through the vertex without intravenous contrast. RADIATION DOSE REDUCTION: This exam was performed according to the departmental dose-optimization program which includes automated exposure control, adjustment of the mA and/or kV according to patient size and/or use of iterative reconstruction technique. COMPARISON:  None Available. FINDINGS: Brain: No acute intracranial hemorrhage. No focal mass lesion. No CT evidence of acute infarction. No midline shift or mass effect. No hydrocephalus. Basilar cisterns are patent. There are periventricular and subcortical white matter hypodensities. Generalized cortical atrophy. Vascular: No hyperdense vessel or unexpected calcification. Skull: Normal. Negative for fracture or focal lesion. Sinuses/Orbits: Paranasal sinuses and mastoid air cells are clear. Orbits are clear. Other: None. IMPRESSION: 1. No acute intracranial findings. 2. Atrophy and white matter microvascular disease. Electronically Signed   By: Suzy Bouchard M.D.   On: 08/13/2022 09:05   DG Lumbar Spine Complete  Result Date: 07/22/2022 CLINICAL DATA:  Back pain EXAM: LUMBAR SPINE - COMPLETE 4+ VIEW COMPARISON:  No comparison studies available. FINDINGS: No evidence for an acute fracture. Loss of disc height noted L4-5 with 5 mm anterolisthesis of L4 on 5. Facets are well aligned bilaterally. SI joints are unremarkable. Bones are diffusely demineralized. IMPRESSION: Degenerative disc disease at L4-5 with 5 mm anterolisthesis of L4 on 5. Electronically Signed   By: Misty Stanley M.D.   On: 07/22/2022 10:16   CT Head Wo Contrast  Result Date: 07/22/2022 CLINICAL DATA:  Altered mental status, nontraumatic (Ped 0-17y) EXAM: CT HEAD WITHOUT CONTRAST TECHNIQUE: Contiguous axial images were obtained from the base of the skull through the vertex without intravenous contrast. RADIATION DOSE REDUCTION: This exam was performed according to the departmental dose-optimization  program which includes automated exposure control, adjustment of the mA and/or kV according to patient size and/or use of iterative reconstruction technique. COMPARISON:  Head CT 04/11/2021 FINDINGS: Brain: No evidence of acute intracranial hemorrhage or extra-axial collection. No loss of gray-white matter differentiation.No evidence of mass lesion/concerning mass effect.The ventricles are normal in size. Vascular: No hyperdense vessel or unexpected calcification. Skull: Negative for fracture. Sinuses/Orbits: No acute finding. Other: None. IMPRESSION: No acute intracranial abnormality. Mild sequela of chronic small vessel ischemic disease. Electronically Signed   By: Maurine Simmering M.D.   On: 07/22/2022 10:10   DG Chest 2 View  Result Date: 07/22/2022 CLINICAL DATA:  Provided history: Fatigue. EXAM: CHEST - 2 VIEW COMPARISON:  Prior chest radiographs 08/01/2021 and earlier. FINDINGS: Heart size within normal limits. Aortic atherosclerosis. Redemonstrated postsurgical changes within the left lung apex. Unchanged chronic pleuroparenchymal thickening at this site, consistent with scarring. Adjacent calcified granuloma within left lung apex. Mild scarring also present within the lateral left lung base. No acute airspace consolidation. No evidence of pleural effusion or pneumothorax. No acute bony abnormality identified. IMPRESSION: No evidence of acute cardiopulmonary abnormality. Aortic Atherosclerosis (ICD10-I70.0). Electronically Signed   By: Kellie Simmering D.O.   On: 07/22/2022 10:05    Microbiology: Recent Results (from the past 240 hour(s))  Urine Culture     Status: Abnormal   Collection Time: 08/13/22 10:24 AM   Specimen: Urine, Clean Catch  Result Value Ref Range Status   Specimen Description   Final    URINE, CLEAN CATCH Performed at [facility], [address] [place], [place] [address]    Special Requests   Final    NONE Performed at [facility], [address]  [place], [place] [address]    Culture >=100,000 COLONIES/mL ESCHERICHIA COLI (A)  Final   Report Status 08/15/2022 FINAL  Final   Organism ID, Bacteria ESCHERICHIA COLI (A)  Final      Susceptibility   Escherichia coli - MIC*    AMPICILLIN <=2 SENSITIVE Sensitive     CEFAZOLIN <=4 SENSITIVE Sensitive     CEFEPIME <=0.12 SENSITIVE Sensitive     CEFTRIAXONE <=0.25 SENSITIVE Sensitive     CIPROFLOXACIN <=0.25 SENSITIVE Sensitive     GENTAMICIN <=1 SENSITIVE Sensitive     IMIPENEM <=0.25 SENSITIVE Sensitive     NITROFURANTOIN <=16 SENSITIVE Sensitive     TRIMETH/SULFA <=20 SENSITIVE Sensitive     AMPICILLIN/SULBACTAM <=2 SENSITIVE Sensitive     PIP/TAZO <=4 SENSITIVE Sensitive     * >=100,000 COLONIES/mL ESCHERICHIA COLI     Labs: Basic Metabolic Panel: Recent Labs  Lab 08/15/22 0349 08/16/22 0409 08/17/22 0420 08/18/22 0351 08/19/22  0338  NA 143 141 141 141 143  K 4.4 4.2 4.5 4.5 4.3  CL 109 107 107 110 112*  CO2 28 26 28 25 26   GLUCOSE 102* 106* 114* 112* 113*  BUN 23 25* 25* 20 15  CREATININE 1.33* 1.18* 1.22* 1.10* 0.98  CALCIUM 9.3 9.0 9.1 8.8* 8.8*  MG 2.2 2.2 2.4 2.2 2.1  PHOS 4.5 4.0 4.0 4.5 3.8   Liver Function Tests: Recent Labs  Lab 08/15/22 0349 08/16/22 0409 08/17/22 0420 08/18/22 0351 08/19/22 0338  AST 29 45* 58* 59* 41  ALT 22 26 40 44 39  ALKPHOS 72 75 73 64 61  BILITOT 0.5 0.8 0.7 0.6 0.5  PROT 6.3* 6.2* 6.4* 5.9* 5.7*  ALBUMIN 3.5 3.4* 3.4* 3.3* 2.9*   No results for input(s): "LIPASE", "AMYLASE" in the last 168 hours. No results for input(s): "AMMONIA" in the last 168 hours. CBC: Recent Labs  Lab 08/15/22 0349 08/16/22 0409 08/17/22 0420 08/18/22 0351 08/19/22 0338  WBC 9.5 9.6 10.2 7.8 7.2  NEUTROABS 6.1 6.4 7.1 4.6 4.2  HGB 13.4 12.6 12.9 12.4 11.7*  HCT 42.0 39.4 40.7 39.7 37.3  MCV 95.9 95.2 95.5 97.5 97.1  PLT 185 195 183 176 177   Cardiac Enzymes: No results for input(s): "CKTOTAL", "CKMB", "CKMBINDEX", "TROPONINI" in the last  168 hours. BNP: BNP (last 3 results) Recent Labs    03/29/22 1259  BNP 30.4    ProBNP (last 3 results) No results for input(s): "PROBNP" in the last 8760 hours.  CBG: Recent Labs  Lab 08/14/22 1103 08/14/22 1553 08/14/22 2041  GLUCAP 103* 106* 99       Signed:  Dia Crawford, MD [facility]

## 2022-08-20 DIAGNOSIS — I150 Renovascular hypertension: Secondary | ICD-10-CM | POA: Diagnosis not present

## 2022-08-20 DIAGNOSIS — N390 Urinary tract infection, site not specified: Secondary | ICD-10-CM | POA: Diagnosis not present

## 2022-08-20 DIAGNOSIS — I7771 Dissection of carotid artery: Secondary | ICD-10-CM | POA: Diagnosis not present

## 2022-08-20 DIAGNOSIS — I679 Cerebrovascular disease, unspecified: Secondary | ICD-10-CM | POA: Diagnosis not present

## 2022-08-20 DIAGNOSIS — I700 Atherosclerosis of aorta: Secondary | ICD-10-CM | POA: Diagnosis not present

## 2022-08-20 DIAGNOSIS — N1832 Chronic kidney disease, stage 3b: Secondary | ICD-10-CM | POA: Diagnosis not present

## 2022-08-20 DIAGNOSIS — R5381 Other malaise: Secondary | ICD-10-CM | POA: Diagnosis not present

## 2022-08-20 DIAGNOSIS — I129 Hypertensive chronic kidney disease with stage 1 through stage 4 chronic kidney disease, or unspecified chronic kidney disease: Secondary | ICD-10-CM | POA: Diagnosis not present

## 2022-08-20 DIAGNOSIS — G309 Alzheimer's disease, unspecified: Secondary | ICD-10-CM | POA: Diagnosis not present

## 2022-08-20 DIAGNOSIS — F02818 Dementia in other diseases classified elsewhere, unspecified severity, with other behavioral disturbance: Secondary | ICD-10-CM | POA: Diagnosis not present

## 2022-08-21 ENCOUNTER — Other Ambulatory Visit: Payer: Self-pay | Admitting: *Deleted

## 2022-08-21 DIAGNOSIS — M6281 Muscle weakness (generalized): Secondary | ICD-10-CM | POA: Diagnosis not present

## 2022-08-21 DIAGNOSIS — Z9181 History of falling: Secondary | ICD-10-CM | POA: Diagnosis not present

## 2022-08-21 DIAGNOSIS — G301 Alzheimer's disease with late onset: Secondary | ICD-10-CM | POA: Diagnosis not present

## 2022-08-21 DIAGNOSIS — R52 Pain, unspecified: Secondary | ICD-10-CM | POA: Diagnosis not present

## 2022-08-21 DIAGNOSIS — N3000 Acute cystitis without hematuria: Secondary | ICD-10-CM | POA: Diagnosis not present

## 2022-08-21 DIAGNOSIS — R627 Adult failure to thrive: Secondary | ICD-10-CM | POA: Diagnosis not present

## 2022-08-21 DIAGNOSIS — Z8673 Personal history of transient ischemic attack (TIA), and cerebral infarction without residual deficits: Secondary | ICD-10-CM | POA: Diagnosis not present

## 2022-08-21 DIAGNOSIS — R5381 Other malaise: Secondary | ICD-10-CM | POA: Diagnosis not present

## 2022-08-21 DIAGNOSIS — R262 Difficulty in walking, not elsewhere classified: Secondary | ICD-10-CM | POA: Diagnosis not present

## 2022-08-21 NOTE — Patient Outreach (Signed)
Christina Perkins resides in [facility]. Screening for potential [facility] care coordination services as benefit of insurance plan and PCP.   Will follow for transition plans and potential [facility] services while Ms. Scruggs resides in SNF.    Marthenia Rolling, MSN, RN,BSN [place] Acute Care Coordinator [phone] (Direct dial)

## 2022-08-22 DIAGNOSIS — Z8673 Personal history of transient ischemic attack (TIA), and cerebral infarction without residual deficits: Secondary | ICD-10-CM | POA: Diagnosis not present

## 2022-08-22 DIAGNOSIS — G301 Alzheimer's disease with late onset: Secondary | ICD-10-CM | POA: Diagnosis not present

## 2022-08-22 DIAGNOSIS — R262 Difficulty in walking, not elsewhere classified: Secondary | ICD-10-CM | POA: Diagnosis not present

## 2022-08-22 DIAGNOSIS — N3000 Acute cystitis without hematuria: Secondary | ICD-10-CM | POA: Diagnosis not present

## 2022-08-22 DIAGNOSIS — R627 Adult failure to thrive: Secondary | ICD-10-CM | POA: Diagnosis not present

## 2022-08-22 DIAGNOSIS — R52 Pain, unspecified: Secondary | ICD-10-CM | POA: Diagnosis not present

## 2022-08-22 DIAGNOSIS — Z9181 History of falling: Secondary | ICD-10-CM | POA: Diagnosis not present

## 2022-08-22 DIAGNOSIS — R5381 Other malaise: Secondary | ICD-10-CM | POA: Diagnosis not present

## 2022-08-22 DIAGNOSIS — M6281 Muscle weakness (generalized): Secondary | ICD-10-CM | POA: Diagnosis not present

## 2022-08-25 DIAGNOSIS — R52 Pain, unspecified: Secondary | ICD-10-CM | POA: Diagnosis not present

## 2022-08-25 DIAGNOSIS — N3000 Acute cystitis without hematuria: Secondary | ICD-10-CM | POA: Diagnosis not present

## 2022-08-25 DIAGNOSIS — Z9181 History of falling: Secondary | ICD-10-CM | POA: Diagnosis not present

## 2022-08-25 DIAGNOSIS — M6281 Muscle weakness (generalized): Secondary | ICD-10-CM | POA: Diagnosis not present

## 2022-08-25 DIAGNOSIS — R5381 Other malaise: Secondary | ICD-10-CM | POA: Diagnosis not present

## 2022-08-25 DIAGNOSIS — G301 Alzheimer's disease with late onset: Secondary | ICD-10-CM | POA: Diagnosis not present

## 2022-08-25 DIAGNOSIS — R262 Difficulty in walking, not elsewhere classified: Secondary | ICD-10-CM | POA: Diagnosis not present

## 2022-08-25 DIAGNOSIS — R627 Adult failure to thrive: Secondary | ICD-10-CM | POA: Diagnosis not present

## 2022-08-25 DIAGNOSIS — Z8673 Personal history of transient ischemic attack (TIA), and cerebral infarction without residual deficits: Secondary | ICD-10-CM | POA: Diagnosis not present

## 2022-08-26 ENCOUNTER — Encounter (HOSPITAL_COMMUNITY): Payer: Self-pay | Admitting: *Deleted

## 2022-08-26 ENCOUNTER — Emergency Department (HOSPITAL_COMMUNITY): Payer: Medicare Other

## 2022-08-26 ENCOUNTER — Other Ambulatory Visit: Payer: Self-pay

## 2022-08-26 ENCOUNTER — Emergency Department (HOSPITAL_COMMUNITY)
Admission: EM | Admit: 2022-08-26 | Discharge: 2022-08-26 | Disposition: A | Discharge status: Skilled Nursing Facility | Payer: Medicare Other | Attending: Emergency Medicine | Admitting: Emergency Medicine

## 2022-08-26 DIAGNOSIS — M25552 Pain in left hip: Secondary | ICD-10-CM | POA: Insufficient documentation

## 2022-08-26 DIAGNOSIS — S82892A Other fracture of left lower leg, initial encounter for closed fracture: Secondary | ICD-10-CM | POA: Diagnosis not present

## 2022-08-26 DIAGNOSIS — R601 Generalized edema: Secondary | ICD-10-CM | POA: Diagnosis not present

## 2022-08-26 DIAGNOSIS — D72829 Elevated white blood cell count, unspecified: Secondary | ICD-10-CM | POA: Insufficient documentation

## 2022-08-26 DIAGNOSIS — M25561 Pain in right knee: Secondary | ICD-10-CM | POA: Diagnosis not present

## 2022-08-26 DIAGNOSIS — M1612 Unilateral primary osteoarthritis, left hip: Secondary | ICD-10-CM | POA: Diagnosis not present

## 2022-08-26 DIAGNOSIS — M7989 Other specified soft tissue disorders: Secondary | ICD-10-CM | POA: Diagnosis not present

## 2022-08-26 DIAGNOSIS — F0390 Unspecified dementia without behavioral disturbance: Secondary | ICD-10-CM | POA: Diagnosis not present

## 2022-08-26 DIAGNOSIS — M25452 Effusion, left hip: Secondary | ICD-10-CM | POA: Diagnosis not present

## 2022-08-26 DIAGNOSIS — Z743 Need for continuous supervision: Secondary | ICD-10-CM | POA: Diagnosis not present

## 2022-08-26 DIAGNOSIS — R7989 Other specified abnormal findings of blood chemistry: Secondary | ICD-10-CM | POA: Insufficient documentation

## 2022-08-26 DIAGNOSIS — Z043 Encounter for examination and observation following other accident: Secondary | ICD-10-CM | POA: Diagnosis not present

## 2022-08-26 DIAGNOSIS — M533 Sacrococcygeal disorders, not elsewhere classified: Secondary | ICD-10-CM | POA: Diagnosis not present

## 2022-08-26 DIAGNOSIS — W19XXXA Unspecified fall, initial encounter: Secondary | ICD-10-CM | POA: Insufficient documentation

## 2022-08-26 DIAGNOSIS — I10 Essential (primary) hypertension: Secondary | ICD-10-CM | POA: Diagnosis not present

## 2022-08-26 DIAGNOSIS — S80919A Unspecified superficial injury of unspecified knee, initial encounter: Secondary | ICD-10-CM | POA: Diagnosis not present

## 2022-08-26 DIAGNOSIS — S99912A Unspecified injury of left ankle, initial encounter: Secondary | ICD-10-CM | POA: Diagnosis present

## 2022-08-26 DIAGNOSIS — J984 Other disorders of lung: Secondary | ICD-10-CM | POA: Diagnosis not present

## 2022-08-26 LAB — URINALYSIS, ROUTINE W REFLEX MICROSCOPIC
Bilirubin Urine: NEGATIVE
Glucose, UA: NEGATIVE mg/dL
Hgb urine dipstick: NEGATIVE
Ketones, ur: NEGATIVE mg/dL
Leukocytes,Ua: NEGATIVE
Nitrite: NEGATIVE
Protein, ur: NEGATIVE mg/dL
Specific Gravity, Urine: 1.025 (ref 1.005–1.030)
pH: 5 (ref 5.0–8.0)

## 2022-08-26 LAB — CBC WITH DIFFERENTIAL/PLATELET
Abs Immature Granulocytes: 0.08 10*3/uL — ABNORMAL HIGH (ref 0.00–0.07)
Basophils Absolute: 0 10*3/uL (ref 0.0–0.1)
Basophils Relative: 0 %
Eosinophils Absolute: 0 10*3/uL (ref 0.0–0.5)
Eosinophils Relative: 0 %
HCT: 40 % (ref 36.0–46.0)
Hemoglobin: 13 g/dL (ref 12.0–15.0)
Immature Granulocytes: 1 %
Lymphocytes Relative: 5 %
Lymphs Abs: 0.8 10*3/uL (ref 0.7–4.0)
MCH: 31 pg (ref 26.0–34.0)
MCHC: 32.5 g/dL (ref 30.0–36.0)
MCV: 95.2 fL (ref 80.0–100.0)
Monocytes Absolute: 0.7 10*3/uL (ref 0.1–1.0)
Monocytes Relative: 4 %
Neutro Abs: 14.1 10*3/uL — ABNORMAL HIGH (ref 1.7–7.7)
Neutrophils Relative %: 90 %
Platelets: 254 10*3/uL (ref 150–400)
RBC: 4.2 MIL/uL (ref 3.87–5.11)
RDW: 12.4 % (ref 11.5–15.5)
WBC: 15.7 10*3/uL — ABNORMAL HIGH (ref 4.0–10.5)
nRBC: 0 % (ref 0.0–0.2)

## 2022-08-26 LAB — BASIC METABOLIC PANEL
Anion gap: 8 (ref 5–15)
BUN: 26 mg/dL — ABNORMAL HIGH (ref 8–23)
CO2: 24 mmol/L (ref 22–32)
Calcium: 9.5 mg/dL (ref 8.9–10.3)
Chloride: 111 mmol/L (ref 98–111)
Creatinine, Ser: 1.18 mg/dL — ABNORMAL HIGH (ref 0.44–1.00)
GFR, Estimated: 46 mL/min — ABNORMAL LOW (ref >60)
Glucose, Bld: 115 mg/dL — ABNORMAL HIGH (ref 70–99)
Potassium: 4 mmol/L (ref 3.5–5.1)
Sodium: 143 mmol/L (ref 135–145)

## 2022-08-26 LAB — TROPONIN I (HIGH SENSITIVITY): Troponin I (High Sensitivity): 5 ng/L (ref <18)

## 2022-08-26 LAB — CK: Total CK: 330 U/L — ABNORMAL HIGH (ref 38–234)

## 2022-08-26 MED ORDER — MORPHINE SULFATE (PF) 2 MG/ML IV SOLN
2.0000 mg | Freq: Once | INTRAVENOUS | Status: AC
  Administered 2022-08-26: 2 mg via INTRAVENOUS
  Filled 2022-08-26: qty 1

## 2022-08-26 MED ORDER — KETOROLAC TROMETHAMINE 15 MG/ML IJ SOLN
15.0000 mg | Freq: Once | INTRAMUSCULAR | Status: AC
  Administered 2022-08-26: 15 mg via INTRAVENOUS
  Filled 2022-08-26: qty 1

## 2022-08-26 MED ORDER — SODIUM CHLORIDE 0.9 % IV BOLUS
1000.0000 mL | Freq: Once | INTRAVENOUS | Status: AC
  Administered 2022-08-26: 1000 mL via INTRAVENOUS

## 2022-08-26 NOTE — ED Notes (Signed)
Report called to Casey Burkitt at [facility], phone number [phone], pt awaits transport

## 2022-08-26 NOTE — ED Provider Notes (Signed)
[facility] DEPT Provider Note   CSN: [id number] Arrival date & time: 08/26/22  [id number]     History  Chief Complaint  Patient presents with   Fall   Knee Pain    Christina Perkins is a 81 y.o. female presenting to the ED with fall for unknown time, presenting by EMS from [facility].  The patient was found down on the ground around 7:30 AM today.  It is not clear how long she had been on the ground.  She was complaining of left hip pain primarily and refusing to straighten out her leg.  The patient herself has significant dementia, cannot recall when she fell or why she fell.  She has no other complaints other than her left hip hurting her.  She denies headache or neck pain.  Her medication chart at the bedside does not show any blood thinner use.  HPI     Home Medications Prior to Admission medications   Medication Sig Start Date End Date Taking? Authorizing Provider  acetaminophen (TYLENOL) 500 MG tablet Take 2 tablets (1,000 mg total) by mouth in the morning and at bedtime. 07/27/20  Yes Ngetich, Dinah C, NP  atorvastatin (LIPITOR) 10 MG tablet TAKE 1 TABLET BY MOUTH AT  BEDTIME Patient taking differently: Take 10 mg by mouth at bedtime. 07/28/22  Yes Ngetich, Dinah C, NP  cholecalciferol (VITAMIN D3) 25 MCG (1000 UNIT) tablet Take 1,000 Units by mouth daily.   Yes [provider]  cloNIDine (CATAPRES) 0.1 MG tablet Take 1 tablet (0.1 mg total) by mouth every 4 (four) hours as needed (SBP >170 or DBP > 100). Patient taking differently: Take 0.1 mg by mouth every 4 (four) hours as needed (SBP > 170 - OR - DBP > 100). 08/19/22  Yes Allie Bossier, MD  donepezil (ARICEPT) 10 MG tablet Take 10 mg by mouth at bedtime. Before Bedtime.   Yes [provider]  labetalol (NORMODYNE) 100 MG tablet Take 1 tablet (100 mg total) by mouth 2 (two) times daily. 08/19/22  Yes Allie Bossier, MD  memantine (NAMENDA) 10 MG tablet Take 10 mg by mouth 2 (two)  times daily.   Yes [provider]  mirtazapine (REMERON) 15 MG tablet Take 1 tablet (15 mg total) by mouth at bedtime. 08/19/22  Yes Allie Bossier, MD  ondansetron (ZOFRAN) 4 MG tablet Take 1 tablet (4 mg total) by mouth every 6 (six) hours as needed for nausea. 08/19/22  Yes Allie Bossier, MD  melatonin 10 MG TABS Take 10 mg by mouth at bedtime as needed. Patient not taking: Reported on 08/26/2022 08/19/22   Allie Bossier, MD      Allergies    Patient has no known allergies.    Review of Systems   Review of Systems  Physical Exam Updated Vital Signs BP (!) 154/83   Pulse 85   Temp 97.8 F (36.6 C)   Resp 16   SpO2 98%  Physical Exam Constitutional:      General: She is not in acute distress. HENT:     Head: Normocephalic and atraumatic.  Eyes:     Conjunctiva/sclera: Conjunctivae normal.     Pupils: Pupils are equal, round, and reactive to light.  Cardiovascular:     Rate and Rhythm: Normal rate and regular rhythm.  Pulmonary:     Effort: Pulmonary effort is normal. No respiratory distress.  Abdominal:     General: There is no distension.  Musculoskeletal:  Comments: Tenderness of the left lateral malleoli and the left hip with flexion of the left hip.  Skin:    General: Skin is warm and dry.  Neurological:     General: No focal deficit present.     Mental Status: She is alert. Mental status is at baseline.     ED Results / Procedures / Treatments   Labs (all labs ordered are listed, but only abnormal results are displayed) Labs Reviewed  BASIC METABOLIC PANEL - Abnormal; Notable for the following components:      Result Value   Glucose, Bld 115 (*)    BUN 26 (*)    Creatinine, Ser 1.18 (*)    GFR, Estimated 46 (*)    All other components within normal limits  CBC WITH DIFFERENTIAL/PLATELET - Abnormal; Notable for the following components:   WBC 15.7 (*)    Neutro Abs 14.1 (*)    Abs Immature Granulocytes 0.08 (*)    All other components  within normal limits  CK - Abnormal; Notable for the following components:   Total CK 330 (*)    All other components within normal limits  URINALYSIS, ROUTINE W REFLEX MICROSCOPIC  TROPONIN I (HIGH SENSITIVITY)    EKG EKG Interpretation  Date/Time:  Tuesday August 26 2022 10:06:48 EDT Ventricular Rate:  87 PR Interval:  162 QRS Duration: 76 QT Interval:  375 QTC Calculation: 452 R Axis:   61 Text Interpretation: Sinus rhythm RAE, consider biatrial enlargement Borderline T wave abnormalities, similar to Sept 2023 prior ecg, no significant changes Confirmed by Octaviano Glow [phone]) on 08/26/2022 10:11:58 AM  Radiology MR HIP LEFT WO CONTRAST  Result Date: 08/26/2022 CLINICAL DATA:  Left hip pain after falling. Unable to straighten leg and ambulate. Evaluate for occult fracture. EXAM: MR OF THE LEFT HIP WITHOUT CONTRAST TECHNIQUE: Multiplanar, multisequence MR imaging was performed. No intravenous contrast was administered. COMPARISON:  Radiographs same date. FINDINGS: Bones: There is no evidence of acute fracture, dislocation or femoral head osteonecrosis. Moderate degenerative changes of both hips, worse on the right. The bony pelvis and visualized lower lumbar spine appear unremarkable. There are mild sacroiliac degenerative changes bilaterally. Articular cartilage and labrum Articular cartilage: Moderate degenerative changes of both hips, worse on the right. There are intra-articular loose bodies anteriorly within the right hip joint. Labrum: There is no gross labral tear or paralabral abnormality. Joint or bursal effusion Joint effusion: No significant hip joint effusion. Bursae: No significant left hip joint effusion. Small right hip joint effusion with intra-articular loose bodies anteriorly. Muscles and tendons Muscles and tendons: The visualized gluteus, hamstring and iliopsoas tendons appear normal. The piriformis muscles appear symmetric. No focal muscular atrophy identified.  There is mild T2 hyperintensity within the right gluteus maximus and right quadratus femoris muscles. Other findings Miscellaneous: Mild generalized subcutaneous edema without evidence of focal fluid collection. Trace free pelvic fluid. Diverticular changes in the distal colon with predominantly liquid stool in the rectum. IMPRESSION: 1. No evidence of acute fracture or dislocation. 2. Moderate degenerative changes of both hips, worse on the right. Small right hip joint effusion with intra-articular loose bodies anteriorly. 3. Mild T2 hyperintensity within the right gluteus maximus and right quadratus femoris muscles, potentially due to muscular strain. No significant left hip soft tissue abnormalities are identified. Electronically Signed   By: Richardean Sale M.D.   On: 08/26/2022 13:36   CT Head Wo Contrast  Result Date: 08/26/2022 CLINICAL DATA:  Mental status changes.  Found down. EXAM: CT  HEAD WITHOUT CONTRAST TECHNIQUE: Contiguous axial images were obtained from the base of the skull through the vertex without intravenous contrast. RADIATION DOSE REDUCTION: This exam was performed according to the departmental dose-optimization program which includes automated exposure control, adjustment of the mA and/or kV according to patient size and/or use of iterative reconstruction technique. COMPARISON:  08/13/2022 FINDINGS: Brain: There is no evidence for acute hemorrhage, hydrocephalus, mass lesion, or abnormal extra-axial fluid collection. No definite CT evidence for acute infarction. Diffuse loss of parenchymal volume is consistent with atrophy. Patchy low attenuation in the deep hemispheric and periventricular white matter is nonspecific, but likely reflects chronic microvascular ischemic demyelination. Vascular: No hyperdense vessel or unexpected calcification. Skull: No evidence for fracture. No worrisome lytic or sclerotic lesion. Sinuses/Orbits: The visualized paranasal sinuses and mastoid air cells are  clear. Visualized portions of the globes and intraorbital fat are unremarkable. Other: None. IMPRESSION: 1. No acute intracranial abnormality. 2. Atrophy with chronic small vessel ischemic disease. Electronically Signed   By: Misty Stanley M.D.   On: 08/26/2022 11:33   DG Hip Unilat W or Wo Pelvis 2-3 Views Left  Result Date: 08/26/2022 CLINICAL DATA:  Left hip pain after fall. EXAM: DG HIP (WITH OR WITHOUT PELVIS) 2-3V LEFT COMPARISON:  07/17/2020. FINDINGS: The cross-table lateral projection radiograph of the left hip is suboptimal due to overlying soft tissues. Within this limitation, there is no evidence of hip fracture or dislocation. Mild bilateral hip osteoarthritis noted with subchondral sclerosis, marginal spur formation and joint space narrowing. Similar appearance of increased sclerosis within bilateral femoral heads which may reflect underlying avascular necrosis. IMPRESSION: 1. No acute findings. If there is high clinical suspicion for occult fracture or the patient refuses to weightbear, consider further evaluation with MRI. Although CT is expeditious, evidence is lacking regarding accuracy of CT over plain film radiography. 2. Mild bilateral hip osteoarthritis. 3. Similar appearance of increased sclerosis within bilateral femoral heads which may reflect underlying avascular necrosis. Electronically Signed   By: Kerby Moors M.D.   On: 08/26/2022 11:30   DG Ankle Complete Left  Result Date: 08/26/2022 CLINICAL DATA:  fall, left lateral posterior malleolar ttp EXAM: LEFT ANKLE COMPLETE - 3+ VIEW COMPARISON:  None Available. FINDINGS: There is mild lateral ankle soft tissue swelling. There is a tiny bony fragment adjacent to the tip of the lateral malleolus/lateral talar process which appears well corticated on limited views. Os peroneum. IMPRESSION: Lateral ankle soft tissue swelling. Tiny bony fragment adjacent to the tip of the lateral malleolus/lateral talar process which is favored to be  chronic, but given soft tissue swelling would correlate with point tenderness for possible acute avulsion injury. Electronically Signed   By: Maurine Simmering M.D.   On: 08/26/2022 11:26   DG CHEST PORT 1 VIEW  Result Date: 08/26/2022 CLINICAL DATA:  Fall EXAM: PORTABLE CHEST 1 VIEW COMPARISON:  07/22/2022 FINDINGS: Cardiac and mediastinal contours are within normal limits. Aortic atherosclerosis. Postsurgical changes in the left apex with adjacent calcified granuloma. Scarring in the left lateral lung base. No new focal pulmonary opacity. No pleural effusion or pneumothorax. No acute osseous abnormality. IMPRESSION: No acute cardiopulmonary process. Electronically Signed   By: Merilyn Baba M.D.   On: 08/26/2022 11:24    Procedures Procedures    Medications Ordered in ED Medications  ketorolac (TORADOL) 15 MG/ML injection 15 mg (15 mg Intravenous Given 08/26/22 1035)  morphine (PF) 2 MG/ML injection 2 mg (2 mg Intravenous Given 08/26/22 1037)  sodium chloride 0.9 %  bolus 1,000 mL (0 mLs Intravenous Stopped 08/26/22 1541)    ED Course/ Medical Decision Making/ A&P Clinical Course as of 08/26/22 1553  Tue Aug 26, 2022  1148 Patient's son Gwyndolyn Saxon updated by phone.  He is currently at a doctor's appointment but plans to come to the ED afterwards. [MT]  1412 Patient is been present at the bedside and updated regarding diagnosis.  Patient likely has an avulsion fracture left lateral malleoli, we can place in a cam boot, but PT will need to work with her at [facility].  I advised her son to discuss with [facility] their wishes for home health and/or palliative care, per his conversation with me.  However I do not think she is ready to go home at this time.  She does not have a fracture of the left hip on MRI, thankfully, but does have concern for a possible muscle injury.  She will likely have very little mobility on her left leg for the next several days. [MT]    Clinical Course User Index [MT]  Ambrosia Wisnewski, Carola Rhine, MD                           Medical Decision Making Amount and/or Complexity of Data Reviewed Labs: ordered. Radiology: ordered.  Risk Prescription drug management.   Patient has dementia, level 5 caveat.  She is here after being found down at home.  Differential diagnosis include mechanical fall versus syncope versus near syncope versus other.  Co-morbidities that complicate the patient evaluation: Patient history is limited by dementia.  Additional history obtained from EMS, patient's son on phone  External records from outside source obtained and reviewed including hospital discharge earlier this week to rehab facility after treatment for UTI.  MRI of the brain showed no acute stroke or lesions within the past month.  I ordered and personally interpreted labs.  The pertinent results include: CK very mildly elevated in the 300s.  Troponin is negative.  BUN and creatinine near baseline levels.  UA without evidence of infection.  Patient does have a leukocytosis which likely reactive and related to prolonged downtime.  I do not see evidence of infection.  I ordered imaging studies including x-ray of the chest, CT of the head, x-ray of the left ankle and hip, MRI of the hip I independently visualized and interpreted imaging which showed no acute intracranial lesion, potential avulsion fracture left lateral malleolus, no acute fracture noted on MRI of the left hip, although the patient may have muscle injury; she does have arthritis of the left hip I agree with the radiologist interpretation  The patient was maintained on a cardiac monitor.  I personally viewed and interpreted the cardiac monitored which showed an underlying rhythm of: Sinus rhythm  Per my interpretation the patient's ECG shows no acute ischemic findings  I ordered medication including IV fluid bolus, IV morphine, IV Toradol  Test Considered: The patient has no tenderness of the left knee good range  of motion, full range of motion of the right hip and knee, I have no suspicion for fracture of these extremities not believe she needs further imaging.  After the interventions noted above, I reevaluated the patient and found that they have: Pain is improved after medications.  Patient remains very comfortable in bed  Dispostion:  After consideration of the diagnostic results and the patients response to treatment, I feel that the patent would benefit from continued physical therapy at  [facility].  She can use a CAM boot weightbearing as tolerated.    Patient will need [facility] for transport back to facility.        Final Clinical Impression(s) / ED Diagnoses Final diagnoses:  Closed avulsion fracture of left ankle, initial encounter  Fall, initial encounter  Left hip pain    Rx / DC Orders ED Discharge Orders     None         Calder Oblinger, Carola Rhine, MD 08/26/22 1553

## 2022-08-26 NOTE — ED Notes (Signed)
[facility] called for pt transport [phone]

## 2022-08-26 NOTE — ED Notes (Signed)
Pt in bed, pt back from ct, pt states that her pain is better, states that it hurts a little bit.

## 2022-08-26 NOTE — ED Notes (Signed)
Pt back from MRI, pt denies pain, son at bedside.

## 2022-08-26 NOTE — ED Triage Notes (Signed)
BIB EMS from [facility], pt found in floor around 0730, unknown time down, C collar placed by EMS, C/o of bil knee pain, cried out in pain when trying to straighten legs. + Pedal pulse. Small Abrasion to rt knee, intention to left leg from resting on call light cord.   126/72-90-94%

## 2022-08-26 NOTE — ED Triage Notes (Addendum)
Pt to er, pt has c collar in place, pt oriented to person and knows that she is in the hospital, pt re oriented to day of the week  and place.  Pt c/o hip pain. Pt reports L hip pain and L ankle pain with palpation, pt has some liner areas of redness on her L lower extremity.  Areas of redness are non blanchable and aprox 4 inch and 5 inch, two liner areas and one other area of redness on shin.

## 2022-08-26 NOTE — Discharge Instructions (Addendum)
For [facility] - Discharge Instructions:  Christina Perkins had blood work, urine sample, and x-ray imaging done in the ER today.  The x-rays showed a possible avulsion fracture, chip fracture of her left lateral ankle.  This is a stable fracture.  She can wear cam boot whenever she attempts to walk or stand.  She was also having a lot of left hip pain.  She has arthritis in his hip.  We performed an MRI of the hip to look for possible hairline fracture, do not see signs of fracture.   Her MRI of the left hip today, per report shows: IMPRESSION: 1. No evidence of acute fracture or dislocation. 2. Moderate degenerative changes of both hips, worse on the right. Small right hip joint effusion with intra-articular loose bodies anteriorly. 3. Mild T2 hyperintensity within the right gluteus maximus and right quadratus femoris muscles, potentially due to muscular strain. No significant left hip soft tissue abnormalities are identified  She can continue working with physical therapy at the rehab facility.  She can be weightbearing as tolerated, but may require additional assistance with getting up and transfers.  Otherwise she was given a bag of IV fluids.  Her blood work did not show signs of significant dehydration or infection of the urine.  Her CT scan of the head did not show signs of stroke or injury.  Her son Olegario Shearer was updated by phone and at the bedside.  Thank you, Octaviano Glow, MD [facility] ED

## 2022-08-27 DIAGNOSIS — Z8673 Personal history of transient ischemic attack (TIA), and cerebral infarction without residual deficits: Secondary | ICD-10-CM | POA: Diagnosis not present

## 2022-08-27 DIAGNOSIS — I679 Cerebrovascular disease, unspecified: Secondary | ICD-10-CM | POA: Diagnosis not present

## 2022-08-27 DIAGNOSIS — R52 Pain, unspecified: Secondary | ICD-10-CM | POA: Diagnosis not present

## 2022-08-27 DIAGNOSIS — R627 Adult failure to thrive: Secondary | ICD-10-CM | POA: Diagnosis not present

## 2022-08-27 DIAGNOSIS — M6281 Muscle weakness (generalized): Secondary | ICD-10-CM | POA: Diagnosis not present

## 2022-08-27 DIAGNOSIS — G309 Alzheimer's disease, unspecified: Secondary | ICD-10-CM | POA: Diagnosis not present

## 2022-08-27 DIAGNOSIS — R262 Difficulty in walking, not elsewhere classified: Secondary | ICD-10-CM | POA: Diagnosis not present

## 2022-08-27 DIAGNOSIS — Z9181 History of falling: Secondary | ICD-10-CM | POA: Diagnosis not present

## 2022-08-27 DIAGNOSIS — R5381 Other malaise: Secondary | ICD-10-CM | POA: Diagnosis not present

## 2022-08-27 DIAGNOSIS — R600 Localized edema: Secondary | ICD-10-CM | POA: Diagnosis not present

## 2022-08-27 DIAGNOSIS — G301 Alzheimer's disease with late onset: Secondary | ICD-10-CM | POA: Diagnosis not present

## 2022-08-27 DIAGNOSIS — N3000 Acute cystitis without hematuria: Secondary | ICD-10-CM | POA: Diagnosis not present

## 2022-08-28 DIAGNOSIS — R5381 Other malaise: Secondary | ICD-10-CM | POA: Diagnosis not present

## 2022-08-28 DIAGNOSIS — R52 Pain, unspecified: Secondary | ICD-10-CM | POA: Diagnosis not present

## 2022-08-28 DIAGNOSIS — Z8673 Personal history of transient ischemic attack (TIA), and cerebral infarction without residual deficits: Secondary | ICD-10-CM | POA: Diagnosis not present

## 2022-08-28 DIAGNOSIS — R627 Adult failure to thrive: Secondary | ICD-10-CM | POA: Diagnosis not present

## 2022-08-28 DIAGNOSIS — G301 Alzheimer's disease with late onset: Secondary | ICD-10-CM | POA: Diagnosis not present

## 2022-08-28 DIAGNOSIS — Z9181 History of falling: Secondary | ICD-10-CM | POA: Diagnosis not present

## 2022-08-28 DIAGNOSIS — N3000 Acute cystitis without hematuria: Secondary | ICD-10-CM | POA: Diagnosis not present

## 2022-08-28 DIAGNOSIS — M6281 Muscle weakness (generalized): Secondary | ICD-10-CM | POA: Diagnosis not present

## 2022-08-28 DIAGNOSIS — R262 Difficulty in walking, not elsewhere classified: Secondary | ICD-10-CM | POA: Diagnosis not present

## 2022-08-29 ENCOUNTER — Encounter: Payer: Medicare Other | Admitting: Family

## 2022-08-29 ENCOUNTER — Other Ambulatory Visit: Payer: Self-pay | Admitting: *Deleted

## 2022-08-29 NOTE — Patient Outreach (Signed)
[facility] Post- Acute Care Coordinator follow up. Ms. Czarnecki resides in [facility]. Screening for potential [facility] care coordination services as benefit of insurance plan and PCP.   Update received from [place] indicating transition plans are pending progress. Will plan to discuss LTC in next family meeting.   Will continue to follow.    Marthenia Rolling, MSN, RN,BSN [place] Acute Care Coordinator [phone] (Direct dial)

## 2022-09-01 DIAGNOSIS — M6281 Muscle weakness (generalized): Secondary | ICD-10-CM | POA: Diagnosis not present

## 2022-09-01 DIAGNOSIS — R52 Pain, unspecified: Secondary | ICD-10-CM | POA: Diagnosis not present

## 2022-09-01 DIAGNOSIS — G301 Alzheimer's disease with late onset: Secondary | ICD-10-CM | POA: Diagnosis not present

## 2022-09-01 DIAGNOSIS — R5381 Other malaise: Secondary | ICD-10-CM | POA: Diagnosis not present

## 2022-09-01 DIAGNOSIS — N3000 Acute cystitis without hematuria: Secondary | ICD-10-CM | POA: Diagnosis not present

## 2022-09-01 DIAGNOSIS — R627 Adult failure to thrive: Secondary | ICD-10-CM | POA: Diagnosis not present

## 2022-09-01 DIAGNOSIS — Z8673 Personal history of transient ischemic attack (TIA), and cerebral infarction without residual deficits: Secondary | ICD-10-CM | POA: Diagnosis not present

## 2022-09-01 DIAGNOSIS — Z9181 History of falling: Secondary | ICD-10-CM | POA: Diagnosis not present

## 2022-09-01 DIAGNOSIS — R262 Difficulty in walking, not elsewhere classified: Secondary | ICD-10-CM | POA: Diagnosis not present

## 2022-09-03 ENCOUNTER — Other Ambulatory Visit: Payer: Self-pay | Admitting: *Deleted

## 2022-09-03 DIAGNOSIS — N3000 Acute cystitis without hematuria: Secondary | ICD-10-CM | POA: Diagnosis not present

## 2022-09-03 DIAGNOSIS — Z8673 Personal history of transient ischemic attack (TIA), and cerebral infarction without residual deficits: Secondary | ICD-10-CM | POA: Diagnosis not present

## 2022-09-03 DIAGNOSIS — R600 Localized edema: Secondary | ICD-10-CM | POA: Diagnosis not present

## 2022-09-03 DIAGNOSIS — R627 Adult failure to thrive: Secondary | ICD-10-CM | POA: Diagnosis not present

## 2022-09-03 DIAGNOSIS — R5381 Other malaise: Secondary | ICD-10-CM | POA: Diagnosis not present

## 2022-09-03 DIAGNOSIS — I150 Renovascular hypertension: Secondary | ICD-10-CM | POA: Diagnosis not present

## 2022-09-03 DIAGNOSIS — R262 Difficulty in walking, not elsewhere classified: Secondary | ICD-10-CM | POA: Diagnosis not present

## 2022-09-03 DIAGNOSIS — Z8679 Personal history of other diseases of the circulatory system: Secondary | ICD-10-CM | POA: Diagnosis not present

## 2022-09-03 DIAGNOSIS — M6281 Muscle weakness (generalized): Secondary | ICD-10-CM | POA: Diagnosis not present

## 2022-09-03 DIAGNOSIS — R52 Pain, unspecified: Secondary | ICD-10-CM | POA: Diagnosis not present

## 2022-09-03 DIAGNOSIS — G301 Alzheimer's disease with late onset: Secondary | ICD-10-CM | POA: Diagnosis not present

## 2022-09-03 DIAGNOSIS — Z9181 History of falling: Secondary | ICD-10-CM | POA: Diagnosis not present

## 2022-09-03 NOTE — Patient Outreach (Signed)
[facility] Coordinator follow up. Ms. Jerry resides in [facility].   Update received from [place], [place], indicating insurance has extended stay. Likely transition home next week. Family is planning to arrange 24/7 care at home.   Will plan outreach to daughter to discuss [facility] care coordination services.   Marthenia Rolling, MSN, RN,BSN [facility] Acute Care Coordinator [phone] (Direct dial)

## 2022-09-04 DIAGNOSIS — Z87440 Personal history of urinary (tract) infections: Secondary | ICD-10-CM | POA: Diagnosis not present

## 2022-09-04 DIAGNOSIS — Z9181 History of falling: Secondary | ICD-10-CM | POA: Diagnosis not present

## 2022-09-04 DIAGNOSIS — R5381 Other malaise: Secondary | ICD-10-CM | POA: Diagnosis not present

## 2022-09-04 DIAGNOSIS — Z8673 Personal history of transient ischemic attack (TIA), and cerebral infarction without residual deficits: Secondary | ICD-10-CM | POA: Diagnosis not present

## 2022-09-04 DIAGNOSIS — R627 Adult failure to thrive: Secondary | ICD-10-CM | POA: Diagnosis not present

## 2022-09-04 DIAGNOSIS — G301 Alzheimer's disease with late onset: Secondary | ICD-10-CM | POA: Diagnosis not present

## 2022-09-04 DIAGNOSIS — N3000 Acute cystitis without hematuria: Secondary | ICD-10-CM | POA: Diagnosis not present

## 2022-09-04 DIAGNOSIS — R52 Pain, unspecified: Secondary | ICD-10-CM | POA: Diagnosis not present

## 2022-09-04 DIAGNOSIS — R262 Difficulty in walking, not elsewhere classified: Secondary | ICD-10-CM | POA: Diagnosis not present

## 2022-09-04 DIAGNOSIS — M6281 Muscle weakness (generalized): Secondary | ICD-10-CM | POA: Diagnosis not present

## 2022-09-08 DIAGNOSIS — Z8673 Personal history of transient ischemic attack (TIA), and cerebral infarction without residual deficits: Secondary | ICD-10-CM | POA: Diagnosis not present

## 2022-09-08 DIAGNOSIS — R627 Adult failure to thrive: Secondary | ICD-10-CM | POA: Diagnosis not present

## 2022-09-08 DIAGNOSIS — R262 Difficulty in walking, not elsewhere classified: Secondary | ICD-10-CM | POA: Diagnosis not present

## 2022-09-08 DIAGNOSIS — N3000 Acute cystitis without hematuria: Secondary | ICD-10-CM | POA: Diagnosis not present

## 2022-09-08 DIAGNOSIS — G301 Alzheimer's disease with late onset: Secondary | ICD-10-CM | POA: Diagnosis not present

## 2022-09-08 DIAGNOSIS — Z9181 History of falling: Secondary | ICD-10-CM | POA: Diagnosis not present

## 2022-09-08 DIAGNOSIS — R5381 Other malaise: Secondary | ICD-10-CM | POA: Diagnosis not present

## 2022-09-08 DIAGNOSIS — M6281 Muscle weakness (generalized): Secondary | ICD-10-CM | POA: Diagnosis not present

## 2022-09-08 DIAGNOSIS — R52 Pain, unspecified: Secondary | ICD-10-CM | POA: Diagnosis not present

## 2022-09-09 ENCOUNTER — Ambulatory Visit: Payer: Medicare Other | Admitting: Orthopedic Surgery

## 2022-09-10 DIAGNOSIS — N3000 Acute cystitis without hematuria: Secondary | ICD-10-CM | POA: Diagnosis not present

## 2022-09-10 DIAGNOSIS — Z9181 History of falling: Secondary | ICD-10-CM | POA: Diagnosis not present

## 2022-09-10 DIAGNOSIS — R5381 Other malaise: Secondary | ICD-10-CM | POA: Diagnosis not present

## 2022-09-10 DIAGNOSIS — Z8673 Personal history of transient ischemic attack (TIA), and cerebral infarction without residual deficits: Secondary | ICD-10-CM | POA: Diagnosis not present

## 2022-09-10 DIAGNOSIS — G301 Alzheimer's disease with late onset: Secondary | ICD-10-CM | POA: Diagnosis not present

## 2022-09-10 DIAGNOSIS — R627 Adult failure to thrive: Secondary | ICD-10-CM | POA: Diagnosis not present

## 2022-09-10 DIAGNOSIS — R52 Pain, unspecified: Secondary | ICD-10-CM | POA: Diagnosis not present

## 2022-09-10 DIAGNOSIS — R262 Difficulty in walking, not elsewhere classified: Secondary | ICD-10-CM | POA: Diagnosis not present

## 2022-09-10 DIAGNOSIS — M6281 Muscle weakness (generalized): Secondary | ICD-10-CM | POA: Diagnosis not present

## 2022-09-11 ENCOUNTER — Other Ambulatory Visit: Payer: Self-pay | Admitting: *Deleted

## 2022-09-11 DIAGNOSIS — M6281 Muscle weakness (generalized): Secondary | ICD-10-CM | POA: Diagnosis not present

## 2022-09-11 DIAGNOSIS — R52 Pain, unspecified: Secondary | ICD-10-CM | POA: Diagnosis not present

## 2022-09-11 DIAGNOSIS — N3000 Acute cystitis without hematuria: Secondary | ICD-10-CM | POA: Diagnosis not present

## 2022-09-11 DIAGNOSIS — R627 Adult failure to thrive: Secondary | ICD-10-CM | POA: Diagnosis not present

## 2022-09-11 DIAGNOSIS — G301 Alzheimer's disease with late onset: Secondary | ICD-10-CM | POA: Diagnosis not present

## 2022-09-11 DIAGNOSIS — Z9181 History of falling: Secondary | ICD-10-CM | POA: Diagnosis not present

## 2022-09-11 DIAGNOSIS — R5381 Other malaise: Secondary | ICD-10-CM | POA: Diagnosis not present

## 2022-09-11 DIAGNOSIS — R262 Difficulty in walking, not elsewhere classified: Secondary | ICD-10-CM | POA: Diagnosis not present

## 2022-09-11 DIAGNOSIS — Z8673 Personal history of transient ischemic attack (TIA), and cerebral infarction without residual deficits: Secondary | ICD-10-CM | POA: Diagnosis not present

## 2022-09-11 NOTE — Patient Outreach (Addendum)
[place] [place] Coordinator follow up. Christina Perkins resides in [facility]. Screening for [facility] care coordination services as benefit of insurance plan and PCP.   Facility site visit to [facility]. Met with SNF social workers and Marketing executive. Anticipated transition plan is to return home with 24/7 assistance. [facility]'s outpatient palliative care program is following. Will have necessary DME. Daughter Christina Perkins) is primary contact.  Went to bedside. Christina Perkins was off unit. Left [facility] literature and writer's contact information on bedside table.   Will plan outreach to discuss [facility] care coordination services.    Christina Rolling, MSN, RN,BSN [facility] Acute Care Coordinator [phone] (Direct dial)

## 2022-09-12 ENCOUNTER — Telehealth: Payer: Self-pay | Admitting: *Deleted

## 2022-09-12 ENCOUNTER — Ambulatory Visit (INDEPENDENT_AMBULATORY_CARE_PROVIDER_SITE_OTHER): Payer: Medicare Other | Admitting: Family

## 2022-09-12 ENCOUNTER — Encounter: Payer: Self-pay | Admitting: Family

## 2022-09-12 DIAGNOSIS — S93412A Sprain of calcaneofibular ligament of left ankle, initial encounter: Secondary | ICD-10-CM | POA: Diagnosis not present

## 2022-09-12 NOTE — Progress Notes (Signed)
Office Visit Note   Patient: Christina Perkins           Date of Birth: [birth year]           MRN: [id number] Visit Date: 09/12/2022              Requested by: Caesar Bookman, NP [address] [place],  [place] [address] PCP: Caesar Bookman, NP  Chief Complaint  Patient presents with   Left Ankle - Pain    S/p fall ER 08/26/22 advised avulsion fx      HPI: The patient is an 81 year old woman who is seen today status post injury to the left ankle after falling out of bed on October 10 she has been in a cam walker nonweightbearing  Assessment & Plan: Visit Diagnoses: No diagnosis found.  Plan: Begin weightbearing as tolerated in the cam walker.  In 2 weeks discontinue the cam walker may advance to regular shoewear's and bear weight as tolerated plan to follow-up in the office in 4 weeks if she fails to improve.  Follow-Up Instructions: No follow-ups on file.   Left Ankle Exam   Tenderness  The patient is experiencing tenderness in the ATF, CF and deltoid.  Swelling: mild  Range of Motion  The patient has normal left ankle ROM.   Tests  Anterior drawer: physiological laxity  Other  Pulse: present      Patient is alert, oriented, no adenopathy, well-dressed, normal affect, normal respiratory effort.   Imaging: No results found. No images are attached to the encounter.  Labs: Lab Results  Component Value Date   REPTSTATUS 08/15/2022 FINAL 08/13/2022   CULT >=100,000 COLONIES/mL ESCHERICHIA COLI (A) 08/13/2022   LABORGA ESCHERICHIA COLI (A) 08/13/2022     Lab Results  Component Value Date   ALBUMIN 2.9 (L) 08/19/2022   ALBUMIN 3.3 (L) 08/18/2022   ALBUMIN 3.4 (L) 08/17/2022    Lab Results  Component Value Date   MG 2.1 08/19/2022   MG 2.2 08/18/2022   MG 2.4 08/17/2022   No results found for: "VD25OH"  No results found for: "PREALBUMIN"    Latest Ref Rng & Units 08/26/2022   10:10 AM 08/19/2022    3:38 AM 08/18/2022    3:51 AM  CBC EXTENDED   WBC 4.0 - 10.5 K/uL 15.7  7.2  7.8   RBC 3.87 - 5.11 MIL/uL 4.20  3.84  4.07   Hemoglobin 12.0 - 15.0 g/dL 99.3  71.6  96.7   HCT 36.0 - 46.0 % 40.0  37.3  39.7   Platelets 150 - 400 K/uL 254  177  176   NEUT# 1.7 - 7.7 K/uL 14.1  4.2  4.6   Lymph# 0.7 - 4.0 K/uL 0.8  2.1  2.3      There is no height or weight on file to calculate BMI.  Orders:  No orders of the defined types were placed in this encounter.  No orders of the defined types were placed in this encounter.    Procedures: No procedures performed  Clinical Data: No additional findings.  ROS:  All other systems negative, except as noted in the HPI. Review of Systems  Objective: Vital Signs: There were no vitals taken for this visit.  Specialty Comments:  No specialty comments available.  PMFS History: Patient Active Problem List   Diagnosis Date Noted   Failure to thrive in adult 08/17/2022   Anorexia 08/17/2022   UTI (urinary tract infection) 08/14/2022   E.  coli UTI 08/14/2022   Acute cystitis without hematuria 08/13/2022   Stage 3b chronic kidney disease (CKD) ([place]) 08/13/2022   Acute bilateral low back pain without sciatica 11/08/2020   Late onset Alzheimer's dementia without behavioral disturbance ([place]) 08/28/2020   Malnutrition of moderate degree 07/19/2020   Weakness 07/18/2020   Generalized weakness 07/18/2020   Fall at home, initial encounter 07/18/2020   Avascular necrosis of femoral head ([place]) 07/18/2020   Essential hypertension 07/18/2020   Injury of toe on right foot 11/07/2013   Pain in toe of right foot 11/07/2013   Past Medical History:  Diagnosis Date   Abnormal chest x-ray    Abnormal results of liver function studies    Abnormal thyroid screen (blood)    Acute sinusitis    Alcohol screening    Anemia, deficiency    Arthropathy    BMI 22.0-22.9, adult    Carotid stenosis, bilateral    Carotid stenosis, bilateral    Cellulitis    Chronic kidney disease, stage III  (moderate) (HCC)    Closed fracture of sacrum and coccyx, with routine healing, subsequent encounter    Colon cancer screening    Colon polyp    Dementia ([place])    Dizzy spells    E. coli UTI 08/14/2022   Encounter for screening mammogram for breast cancer    Facet arthropathy, lumbar    Fatty pancreas    Generalized abdominal or pelvic swelling or mass or lump    Glaucoma screening    H/O mammogram 2019   Per [place] new patient packet   Hepatomegalia    High risk medication use    Hospital discharge follow-up    Hx of colonoscopy 2016   Per [place] new patient packet   Hypertension    Hypertriglyceridemia    Influenza vaccination declined by patient    Low back pain    Lumbar disc disease with radiculopathy    Lung nodule    Magnesium deficiency    Medical non-compliance    Menopause    Microscopic hematuria    Mixed dyslipidemia    Neurodegenerative cognitive impairment (HCC)    Osteoarthritis, hip, bilateral    Overweight    Personal history of smoking    Plantar fasciitis    Poor appetite    Refusal of influenza vaccine by provider    Renal cyst, left    Right atrial enlargement    Right hip pain    Sciatica of right side    Screening for depression    Screening for HIV (human immunodeficiency virus)    Screening for STDs (sexually transmitted diseases)    Senile dementia without behavioral disturbance (HCC)    Shortness of breath    Spondylolisthesis, grade 1    Unintentional weight loss    URI, acute    Urinary frequency    UTI (urinary tract infection), bacterial    Vitamin D deficiency     Family History  Problem Relation Age of Onset   Dementia Mother    Alzheimer's disease Mother    Hypertension Mother    Dementia Father    Blindness Father    Hypertension Father    Stroke Father     No past surgical history on file. Social History   Occupational History   Not on file  Tobacco Use   Smoking status: Former   Smokeless tobacco: Never   Tobacco  comments:    Quit at about age 41   Vaping Use  Vaping Use: Never used  Substance and Sexual Activity   Alcohol use: Not Currently    Comment: 6 glasses of wine a week.   Drug use: No   Sexual activity: Not on file

## 2022-09-12 NOTE — Telephone Encounter (Signed)
Noted  

## 2022-09-12 NOTE — Telephone Encounter (Signed)
Eather Chaires [phone]  Dropped off FMLA paperwork for herself to have filled out due to caring for patient.   Called and spoke with Kenney Houseman because patient has not been seen in office since June.   Patient is Currently in [facility]   I informed her that we could not fill out paperwork until patient was released. She stated that she will get either [facility] or [facility] to fill out paperwork instead and stated that once patient was released she will call and schedule a follow up appointment.   Agreed.   Forwarded to [employer] for [employer]

## 2022-09-15 DIAGNOSIS — G301 Alzheimer's disease with late onset: Secondary | ICD-10-CM | POA: Diagnosis not present

## 2022-09-15 DIAGNOSIS — Z9181 History of falling: Secondary | ICD-10-CM | POA: Diagnosis not present

## 2022-09-15 DIAGNOSIS — R627 Adult failure to thrive: Secondary | ICD-10-CM | POA: Diagnosis not present

## 2022-09-15 DIAGNOSIS — R5381 Other malaise: Secondary | ICD-10-CM | POA: Diagnosis not present

## 2022-09-15 DIAGNOSIS — R52 Pain, unspecified: Secondary | ICD-10-CM | POA: Diagnosis not present

## 2022-09-15 DIAGNOSIS — R262 Difficulty in walking, not elsewhere classified: Secondary | ICD-10-CM | POA: Diagnosis not present

## 2022-09-15 DIAGNOSIS — N3000 Acute cystitis without hematuria: Secondary | ICD-10-CM | POA: Diagnosis not present

## 2022-09-15 DIAGNOSIS — Z8673 Personal history of transient ischemic attack (TIA), and cerebral infarction without residual deficits: Secondary | ICD-10-CM | POA: Diagnosis not present

## 2022-09-15 DIAGNOSIS — M6281 Muscle weakness (generalized): Secondary | ICD-10-CM | POA: Diagnosis not present

## 2022-09-16 DIAGNOSIS — G309 Alzheimer's disease, unspecified: Secondary | ICD-10-CM | POA: Diagnosis not present

## 2022-09-16 DIAGNOSIS — I150 Renovascular hypertension: Secondary | ICD-10-CM | POA: Diagnosis not present

## 2022-09-16 DIAGNOSIS — M7750 Other enthesopathy of unspecified foot: Secondary | ICD-10-CM | POA: Diagnosis not present

## 2022-09-16 DIAGNOSIS — R8271 Bacteriuria: Secondary | ICD-10-CM | POA: Diagnosis not present

## 2022-09-17 DIAGNOSIS — M6281 Muscle weakness (generalized): Secondary | ICD-10-CM | POA: Diagnosis not present

## 2022-09-17 DIAGNOSIS — Z8673 Personal history of transient ischemic attack (TIA), and cerebral infarction without residual deficits: Secondary | ICD-10-CM | POA: Diagnosis not present

## 2022-09-17 DIAGNOSIS — N3000 Acute cystitis without hematuria: Secondary | ICD-10-CM | POA: Diagnosis not present

## 2022-09-17 DIAGNOSIS — G301 Alzheimer's disease with late onset: Secondary | ICD-10-CM | POA: Diagnosis not present

## 2022-09-17 DIAGNOSIS — R627 Adult failure to thrive: Secondary | ICD-10-CM | POA: Diagnosis not present

## 2022-09-17 DIAGNOSIS — R262 Difficulty in walking, not elsewhere classified: Secondary | ICD-10-CM | POA: Diagnosis not present

## 2022-09-17 DIAGNOSIS — Z9181 History of falling: Secondary | ICD-10-CM | POA: Diagnosis not present

## 2022-09-17 DIAGNOSIS — R52 Pain, unspecified: Secondary | ICD-10-CM | POA: Diagnosis not present

## 2022-09-17 DIAGNOSIS — R5381 Other malaise: Secondary | ICD-10-CM | POA: Diagnosis not present

## 2022-09-18 DIAGNOSIS — R2689 Other abnormalities of gait and mobility: Secondary | ICD-10-CM | POA: Diagnosis not present

## 2022-09-18 DIAGNOSIS — I150 Renovascular hypertension: Secondary | ICD-10-CM | POA: Diagnosis not present

## 2022-09-18 DIAGNOSIS — R296 Repeated falls: Secondary | ICD-10-CM | POA: Diagnosis not present

## 2022-09-18 DIAGNOSIS — E785 Hyperlipidemia, unspecified: Secondary | ICD-10-CM | POA: Diagnosis not present

## 2022-09-18 DIAGNOSIS — G4700 Insomnia, unspecified: Secondary | ICD-10-CM | POA: Diagnosis not present

## 2022-09-18 DIAGNOSIS — R627 Adult failure to thrive: Secondary | ICD-10-CM | POA: Diagnosis not present

## 2022-09-18 DIAGNOSIS — M6281 Muscle weakness (generalized): Secondary | ICD-10-CM | POA: Diagnosis not present

## 2022-09-18 DIAGNOSIS — G309 Alzheimer's disease, unspecified: Secondary | ICD-10-CM | POA: Diagnosis not present

## 2022-09-18 DIAGNOSIS — R1310 Dysphagia, unspecified: Secondary | ICD-10-CM | POA: Diagnosis not present

## 2022-09-18 DIAGNOSIS — N3000 Acute cystitis without hematuria: Secondary | ICD-10-CM | POA: Diagnosis not present

## 2022-09-18 DIAGNOSIS — R5381 Other malaise: Secondary | ICD-10-CM | POA: Diagnosis not present

## 2022-09-19 DIAGNOSIS — G309 Alzheimer's disease, unspecified: Secondary | ICD-10-CM | POA: Diagnosis not present

## 2022-09-19 DIAGNOSIS — Z029 Encounter for administrative examinations, unspecified: Secondary | ICD-10-CM | POA: Diagnosis not present

## 2022-09-24 ENCOUNTER — Telehealth: Payer: Self-pay | Admitting: *Deleted

## 2022-09-24 DIAGNOSIS — I69398 Other sequelae of cerebral infarction: Secondary | ICD-10-CM | POA: Diagnosis not present

## 2022-09-24 DIAGNOSIS — F02818 Dementia in other diseases classified elsewhere, unspecified severity, with other behavioral disturbance: Secondary | ICD-10-CM | POA: Diagnosis not present

## 2022-09-24 DIAGNOSIS — N3000 Acute cystitis without hematuria: Secondary | ICD-10-CM | POA: Diagnosis not present

## 2022-09-24 DIAGNOSIS — M6281 Muscle weakness (generalized): Secondary | ICD-10-CM | POA: Diagnosis not present

## 2022-09-24 DIAGNOSIS — G301 Alzheimer's disease with late onset: Secondary | ICD-10-CM | POA: Diagnosis not present

## 2022-09-24 NOTE — Telephone Encounter (Signed)
Debroah Loop a Physical Therapist with [facility] gave her an assessment of: One wk. One visit 2 wks. One visit One wk. One visit 2wks. One visit One wk. One visit Please advise. Debroah Loop also said her daughter said her Dr. took her mother off clonidine and put her on amlodipine.I found she is still on clonidine. I called patient's daughter to clear this up for me. Debroah Loop is a foreigner and was difficult to understand. Her daughter first said it was her Dr. At [facility]. Then she said it was the Dr. At the hospital. I finally found that Dr. Carolyne Littles put patient on Clonidine at [facility].  I couldn't find where patient was put on Amlodipine. Daughter says she remembers taking the script to get it filled.(Amlodipine) Please advise.

## 2022-09-24 NOTE — Telephone Encounter (Signed)
Please verify with Pharmacy what dosage of amlodipine that patient has been taking then refill as requested.

## 2022-09-25 ENCOUNTER — Other Ambulatory Visit: Payer: Self-pay | Admitting: *Deleted

## 2022-09-25 DIAGNOSIS — F0280 Dementia in other diseases classified elsewhere without behavioral disturbance: Secondary | ICD-10-CM

## 2022-09-25 DIAGNOSIS — M6281 Muscle weakness (generalized): Secondary | ICD-10-CM | POA: Diagnosis not present

## 2022-09-25 DIAGNOSIS — I69398 Other sequelae of cerebral infarction: Secondary | ICD-10-CM | POA: Diagnosis not present

## 2022-09-25 DIAGNOSIS — G301 Alzheimer's disease with late onset: Secondary | ICD-10-CM | POA: Diagnosis not present

## 2022-09-25 DIAGNOSIS — N3000 Acute cystitis without hematuria: Secondary | ICD-10-CM | POA: Diagnosis not present

## 2022-09-25 DIAGNOSIS — F02818 Dementia in other diseases classified elsewhere, unspecified severity, with other behavioral disturbance: Secondary | ICD-10-CM | POA: Diagnosis not present

## 2022-09-25 NOTE — Patient Outreach (Signed)
[facility] Post-Acute Care Coordinator follow up. Christina Perkins discharged from [facility] on 09/20/22. Screening for potential [facility] care coordination services as benefit of insurance plan and PCP.   Verified with [facility] social worker, Ms. Platas returned home with family. [facility] was arranged. SNF social worker reports Christina Perkins's other children were adamant for her to return home. Writer advised to contact daughter/DPR [name] to discuss [facility] services.   Telephone call made to [facility] (daughter/DPR) [phone]. Patient identifiers confirmed. [name] endorses Christina Perkins is about the same. Confirms [facility] home [facility] has started. [name] states Christina Perkins lives with her sister. However, [name] reports she is the primary contact person. Discussed [facility] care coordination follow up. [name] is agreeable. Discussed Clinical research associate will make a referral for [facility] RNCM for care coordination.   Advised [name] to contact [facility] customer service line to inquire whether Christina Perkins has transportation benefit. [name] confirms Christina Perkins does have meals on wheels. [name] states she they are not sure how they will get Christina Perkins to the PCP since she is not walking.   Confirmed [name]'s contact number as [phone]. Sent email to [facility] with writer's and [facility] Care Management contact information, and [facility] Care Management brochure.  Will make referral to [facility] care coordination team.    Raiford Noble, MSN, RN,BSN [facility] Post Acute Care Coordinator [phone] (Direct dial)

## 2022-09-26 ENCOUNTER — Telehealth: Payer: Self-pay

## 2022-09-26 ENCOUNTER — Telehealth: Payer: Self-pay | Admitting: *Deleted

## 2022-09-26 NOTE — Telephone Encounter (Addendum)
Andres Shad OT with [facility] called requesting verbal for 5 visits for 5 weeks for ADLS, transfer, pain control, exercise health promotion and infection control.  Called back and left voicemail on confidential voicemail giving verbal orders.

## 2022-09-26 NOTE — Progress Notes (Signed)
  Care Coordination   Note   09/26/2022 Name: Koralee Wedeking MRN: [id number] DOB: Jul 21, 1941  Seriah Brotzman is a 81 y.o. year old female who sees Ngetich, Donalee Citrin, NP for primary care. I reached out to Adrian Prince by phone today to offer care coordination services.  Ms. Boyde was given information about Care Coordination services today including:   The Care Coordination services include support from the care team which includes your Nurse Coordinator, Clinical Social Worker, or Pharmacist.  The Care Coordination team is here to help remove barriers to the health concerns and goals most important to you. Care Coordination services are voluntary, and the patient may decline or stop services at any time by request to their care team member.   Care Coordination Consent Status: Patient daughter Prescott Parma agreed to services and verbal consent obtained.   Follow up plan:  Telephone appointment with care coordination team member scheduled for:  SW 09/29/22 and [facility] 10/08/22  Encounter Outcome:  Pt. Scheduled  [facility]  Care Coordination Care Guide  Direct Dial: [phone]

## 2022-09-29 ENCOUNTER — Other Ambulatory Visit: Payer: Self-pay | Admitting: *Deleted

## 2022-09-29 ENCOUNTER — Ambulatory Visit: Payer: Self-pay

## 2022-09-29 MED ORDER — AMLODIPINE BESYLATE 5 MG PO TABS: 5.0000 mg | ORAL_TABLET | Freq: Every day | ORAL | 0 refills | Status: DC

## 2022-09-29 NOTE — Patient Outreach (Addendum)
  Care Coordination   Follow Up Visit Note   09/29/2022 Name: Christina Perkins MRN: [id number] DOB: Oct 22, 1941  Christina Perkins is a 81 y.o. year old female who sees Ngetich, Donalee Citrin, NP for primary care. I  spoke with patients daughter and caregiver Christina Perkins by phone.  What matters to the patients health and wellness today?  Identify options for transportation to appointments.    Goals Addressed             This Visit's Progress    Care Coordination Activities       Care Coordination Interventions: [facility] screening performed - discussed patient is unable to safely transfer in and out of vehicles and needs assistance with transportation Performed chart review to note patient has [facility] Plan 1 which does offer transportation for medical appointment Verbal and written education provided to patients family - next office visit scheduled for 12/20, family to use health plan benefit Discussed patient is doing well since returning home from rehab - is active with home health services Patients daughter lives with the patient, patients son to begin helping during the day with patient care needs. Family is using [facility] for some caregiver hours however this is expensive and family unsure how long they will utilize this service Discussed opportunity to utilize adult day programs if desired Reminded of future appointment with RN Care Manager scheduled for 11/22 Scheduled follow up call with SW on 12/13 to confirm receipt of mailed resource and assist with arranging transportation if needed         SDOH assessments and interventions completed:  Yes  SDOH Interventions Today    Flowsheet Row Most Recent Value  SDOH Interventions   Food Insecurity Interventions Intervention Not Indicated  Housing Interventions Intervention Not Indicated  Transportation Interventions Payor Benefit  Utilities Interventions Intervention Not Indicated  Financial Strain Interventions  Intervention Not Indicated        Care Coordination Interventions Activated:  Yes  Care Coordination Interventions:  Yes, provided   Follow up plan: Follow up call scheduled for 12/13    Encounter Outcome:  Pt. Visit Completed   Bevelyn Ngo, Kenard Gower, CDP Social Worker, Certified Dementia Practitioner [facility] Care Management  Care Coordination [phone]

## 2022-09-29 NOTE — Patient Instructions (Signed)
Visit Information  Thank you for taking time to visit with me today. Please don't hesitate to contact me if I can be of assistance to you.   Following are the goals we discussed today:   Goals Addressed             This Visit's Progress    Care Coordination Activities       Care Coordination Interventions: [facility]Do[facility] screening performed - discussed patient is unable to safely transfer in and out of vehicles and needs assistance with transportation Performed chart review to note patient has [facility] Plan 1 which does offer transportation for medical appointment Verbal and written education provided to patients family - next office visit scheduled for 12/20, family to use health plan benefit Discussed patient is doing well since returning home from rehab - is active with home health services Reminded of future appointment with RN Care Manager scheduled for 11/22 Scheduled follow up call with SW on 12/13 to confirm receipt of mailed resource and assist with arranging transportation if needed         Our next appointment is by telephone on 12/13 at 11:00 am  Please call the care guide team at [phone] if you need to cancel or reschedule your appointment.   If you are experiencing a Mental Health or Behavioral Health Crisis or need someone to talk to, please call 1-800-273-TALK (toll free, 24 hour hotline)  Patient verbalizes understanding of instructions and care plan provided today and agrees to view in MyChart. Active MyChart status and patient understanding of how to access instructions and care plan via MyChart confirmed with patient.     Telephone follow up appointment with care management team member scheduled for:12/13  Bevelyn Ngo, Kenard Gower, CDP Social Worker, Certified Dementia Practitioner [facility] Care Management  Care Coordination [phone]

## 2022-09-29 NOTE — Telephone Encounter (Signed)
Spoke with daughter she said her mother needs a refill and to send it to [facility]. Created a new rx for Amlodipine 5 mg, as Christina Perkins requested in her last note.I sent the rx to [facility] as requested by patient's daughter. Nothing further needed.

## 2022-09-29 NOTE — Telephone Encounter (Signed)
Noted  

## 2022-09-30 ENCOUNTER — Telehealth: Payer: Self-pay

## 2022-09-30 DIAGNOSIS — F02818 Dementia in other diseases classified elsewhere, unspecified severity, with other behavioral disturbance: Secondary | ICD-10-CM | POA: Diagnosis not present

## 2022-09-30 DIAGNOSIS — N3000 Acute cystitis without hematuria: Secondary | ICD-10-CM | POA: Diagnosis not present

## 2022-09-30 DIAGNOSIS — I69398 Other sequelae of cerebral infarction: Secondary | ICD-10-CM | POA: Diagnosis not present

## 2022-09-30 DIAGNOSIS — G301 Alzheimer's disease with late onset: Secondary | ICD-10-CM | POA: Diagnosis not present

## 2022-09-30 DIAGNOSIS — M6281 Muscle weakness (generalized): Secondary | ICD-10-CM | POA: Diagnosis not present

## 2022-09-30 NOTE — Telephone Encounter (Signed)
Nurse to obtain urine for U/A and C/S to rule out UTI

## 2022-09-30 NOTE — Telephone Encounter (Signed)
Marylene Land nurse w/ [facility] called to report that patient PT personnel states that pt urine is dark w/ foul odor and painful urination. Marylene Land want to know if an order could be placed for her to do a UA on the patient. Marylene Land call back number is [phone] and fax number is [phone].

## 2022-10-01 ENCOUNTER — Observation Stay (HOSPITAL_COMMUNITY): Payer: Medicare Other

## 2022-10-01 ENCOUNTER — Emergency Department (HOSPITAL_COMMUNITY): Payer: Medicare Other

## 2022-10-01 ENCOUNTER — Observation Stay (HOSPITAL_COMMUNITY)
Admission: EM | Admit: 2022-10-01 | Discharge: 2022-10-03 | Disposition: A | Discharge status: Hospice | Payer: Medicare Other | Attending: Internal Medicine | Admitting: Internal Medicine

## 2022-10-01 ENCOUNTER — Other Ambulatory Visit: Payer: Self-pay

## 2022-10-01 DIAGNOSIS — N1832 Chronic kidney disease, stage 3b: Secondary | ICD-10-CM | POA: Diagnosis present

## 2022-10-01 DIAGNOSIS — Z515 Encounter for palliative care: Secondary | ICD-10-CM | POA: Diagnosis not present

## 2022-10-01 DIAGNOSIS — I129 Hypertensive chronic kidney disease with stage 1 through stage 4 chronic kidney disease, or unspecified chronic kidney disease: Secondary | ICD-10-CM | POA: Insufficient documentation

## 2022-10-01 DIAGNOSIS — M7989 Other specified soft tissue disorders: Secondary | ICD-10-CM | POA: Diagnosis not present

## 2022-10-01 DIAGNOSIS — Z8673 Personal history of transient ischemic attack (TIA), and cerebral infarction without residual deficits: Secondary | ICD-10-CM | POA: Diagnosis not present

## 2022-10-01 DIAGNOSIS — I959 Hypotension, unspecified: Secondary | ICD-10-CM | POA: Diagnosis not present

## 2022-10-01 DIAGNOSIS — T68XXXA Hypothermia, initial encounter: Secondary | ICD-10-CM | POA: Diagnosis not present

## 2022-10-01 DIAGNOSIS — N390 Urinary tract infection, site not specified: Secondary | ICD-10-CM | POA: Diagnosis present

## 2022-10-01 DIAGNOSIS — G9341 Metabolic encephalopathy: Secondary | ICD-10-CM | POA: Insufficient documentation

## 2022-10-01 DIAGNOSIS — G301 Alzheimer's disease with late onset: Secondary | ICD-10-CM | POA: Diagnosis present

## 2022-10-01 DIAGNOSIS — N3000 Acute cystitis without hematuria: Secondary | ICD-10-CM | POA: Diagnosis not present

## 2022-10-01 DIAGNOSIS — R627 Adult failure to thrive: Secondary | ICD-10-CM | POA: Insufficient documentation

## 2022-10-01 DIAGNOSIS — R509 Fever, unspecified: Secondary | ICD-10-CM | POA: Diagnosis not present

## 2022-10-01 DIAGNOSIS — Z79899 Other long term (current) drug therapy: Secondary | ICD-10-CM | POA: Insufficient documentation

## 2022-10-01 DIAGNOSIS — M25551 Pain in right hip: Secondary | ICD-10-CM | POA: Diagnosis not present

## 2022-10-01 DIAGNOSIS — Z1152 Encounter for screening for COVID-19: Secondary | ICD-10-CM | POA: Insufficient documentation

## 2022-10-01 DIAGNOSIS — Z66 Do not resuscitate: Secondary | ICD-10-CM | POA: Insufficient documentation

## 2022-10-01 DIAGNOSIS — M2012 Hallux valgus (acquired), left foot: Secondary | ICD-10-CM | POA: Diagnosis not present

## 2022-10-01 DIAGNOSIS — R569 Unspecified convulsions: Secondary | ICD-10-CM | POA: Diagnosis not present

## 2022-10-01 DIAGNOSIS — F0280 Dementia in other diseases classified elsewhere without behavioral disturbance: Secondary | ICD-10-CM | POA: Insufficient documentation

## 2022-10-01 DIAGNOSIS — E876 Hypokalemia: Secondary | ICD-10-CM | POA: Diagnosis not present

## 2022-10-01 DIAGNOSIS — Z87891 Personal history of nicotine dependence: Secondary | ICD-10-CM | POA: Diagnosis not present

## 2022-10-01 DIAGNOSIS — G309 Alzheimer's disease, unspecified: Secondary | ICD-10-CM | POA: Diagnosis not present

## 2022-10-01 DIAGNOSIS — I878 Other specified disorders of veins: Secondary | ICD-10-CM | POA: Diagnosis not present

## 2022-10-01 DIAGNOSIS — R0902 Hypoxemia: Secondary | ICD-10-CM | POA: Diagnosis not present

## 2022-10-01 DIAGNOSIS — I10 Essential (primary) hypertension: Secondary | ICD-10-CM | POA: Diagnosis not present

## 2022-10-01 DIAGNOSIS — R0689 Other abnormalities of breathing: Secondary | ICD-10-CM | POA: Diagnosis not present

## 2022-10-01 LAB — I-STAT CHEM 8, ED
BUN: 17 mg/dL (ref 8–23)
Calcium, Ion: 1.23 mmol/L (ref 1.15–1.40)
Chloride: 102 mmol/L (ref 98–111)
Creatinine, Ser: 0.9 mg/dL (ref 0.44–1.00)
Glucose, Bld: 83 mg/dL (ref 70–99)
HCT: 44 % (ref 36.0–46.0)
Hemoglobin: 15 g/dL (ref 12.0–15.0)
Potassium: 3.7 mmol/L (ref 3.5–5.1)
Sodium: 141 mmol/L (ref 135–145)
TCO2: 26 mmol/L (ref 22–32)

## 2022-10-01 LAB — CBC WITH DIFFERENTIAL/PLATELET
Abs Immature Granulocytes: 0.05 10*3/uL (ref 0.00–0.07)
Basophils Absolute: 0 10*3/uL (ref 0.0–0.1)
Basophils Relative: 0 %
Eosinophils Absolute: 0.2 10*3/uL (ref 0.0–0.5)
Eosinophils Relative: 2 %
HCT: 39 % (ref 36.0–46.0)
Hemoglobin: 12.9 g/dL (ref 12.0–15.0)
Immature Granulocytes: 1 %
Lymphocytes Relative: 25 %
Lymphs Abs: 1.8 10*3/uL (ref 0.7–4.0)
MCH: 30.5 pg (ref 26.0–34.0)
MCHC: 33.1 g/dL (ref 30.0–36.0)
MCV: 92.2 fL (ref 80.0–100.0)
Monocytes Absolute: 0.5 10*3/uL (ref 0.1–1.0)
Monocytes Relative: 7 %
Neutro Abs: 4.7 10*3/uL (ref 1.7–7.7)
Neutrophils Relative %: 65 %
Platelets: 256 10*3/uL (ref 150–400)
RBC: 4.23 MIL/uL (ref 3.87–5.11)
RDW: 12.6 % (ref 11.5–15.5)
WBC: 7.3 10*3/uL (ref 4.0–10.5)
nRBC: 0 % (ref 0.0–0.2)

## 2022-10-01 LAB — RAPID URINE DRUG SCREEN, HOSP PERFORMED
Amphetamines: NOT DETECTED
Barbiturates: NOT DETECTED
Benzodiazepines: NOT DETECTED
Cocaine: NOT DETECTED
Opiates: NOT DETECTED
Tetrahydrocannabinol: NOT DETECTED

## 2022-10-01 LAB — URINALYSIS, ROUTINE W REFLEX MICROSCOPIC
Bilirubin Urine: NEGATIVE
Glucose, UA: NEGATIVE mg/dL
Ketones, ur: 5 mg/dL — AB
Nitrite: POSITIVE — AB
Protein, ur: 30 mg/dL — AB
Specific Gravity, Urine: 1.016 (ref 1.005–1.030)
WBC, UA: >50 WBC/hpf — ABNORMAL HIGH (ref 0–5)
pH: 5 (ref 5.0–8.0)

## 2022-10-01 LAB — CK: Total CK: 72 U/L (ref 38–234)

## 2022-10-01 LAB — COMPREHENSIVE METABOLIC PANEL
ALT: 7 U/L (ref 0–44)
AST: 15 U/L (ref 15–41)
Albumin: 2.4 g/dL — ABNORMAL LOW (ref 3.5–5.0)
Alkaline Phosphatase: 59 U/L (ref 38–126)
Anion gap: 12 (ref 5–15)
BUN: 14 mg/dL (ref 8–23)
CO2: 20 mmol/L — ABNORMAL LOW (ref 22–32)
Calcium: 7.7 mg/dL — ABNORMAL LOW (ref 8.9–10.3)
Chloride: 110 mmol/L (ref 98–111)
Creatinine, Ser: 0.68 mg/dL (ref 0.44–1.00)
GFR, Estimated: >60 mL/min (ref >60)
Glucose, Bld: 76 mg/dL (ref 70–99)
Potassium: 3.1 mmol/L — ABNORMAL LOW (ref 3.5–5.1)
Sodium: 142 mmol/L (ref 135–145)
Total Bilirubin: 0.8 mg/dL (ref 0.3–1.2)
Total Protein: 5 g/dL — ABNORMAL LOW (ref 6.5–8.1)

## 2022-10-01 LAB — PROTIME-INR
INR: 1.1 (ref 0.8–1.2)
Prothrombin Time: 13.8 seconds (ref 11.4–15.2)

## 2022-10-01 LAB — CBG MONITORING, ED: Glucose-Capillary: 79 mg/dL (ref 70–99)

## 2022-10-01 LAB — RESP PANEL BY RT-PCR (FLU A&B, COVID) ARPGX2
Influenza A by PCR: NEGATIVE
Influenza B by PCR: NEGATIVE
SARS Coronavirus 2 by RT PCR: NEGATIVE

## 2022-10-01 LAB — TROPONIN I (HIGH SENSITIVITY): Troponin I (High Sensitivity): 6 ng/L (ref <18)

## 2022-10-01 LAB — MAGNESIUM: Magnesium: 1.5 mg/dL — ABNORMAL LOW (ref 1.7–2.4)

## 2022-10-01 LAB — LACTIC ACID, PLASMA: Lactic Acid, Venous: 1.8 mmol/L (ref 0.5–1.9)

## 2022-10-01 MED ORDER — ENOXAPARIN SODIUM 40 MG/0.4ML IJ SOSY
40.0000 mg | PREFILLED_SYRINGE | INTRAMUSCULAR | Status: DC
  Administered 2022-10-01: 40 mg via SUBCUTANEOUS
  Filled 2022-10-01: qty 0.4

## 2022-10-01 MED ORDER — POTASSIUM CHLORIDE 10 MEQ/100ML IV SOLN
10.0000 meq | INTRAVENOUS | Status: AC
  Administered 2022-10-01 (×2): 10 meq via INTRAVENOUS
  Filled 2022-10-01 (×2): qty 100

## 2022-10-01 MED ORDER — SODIUM CHLORIDE 0.9 % IV BOLUS
500.0000 mL | Freq: Once | INTRAVENOUS | Status: AC
  Administered 2022-10-01: 500 mL via INTRAVENOUS

## 2022-10-01 MED ORDER — CALCIUM CARBONATE 1250 (500 CA) MG PO TABS
1.0000 | ORAL_TABLET | Freq: Once | ORAL | Status: AC
  Administered 2022-10-01: 1250 mg via ORAL
  Filled 2022-10-01: qty 1

## 2022-10-01 MED ORDER — VITAMIN D 25 MCG (1000 UNIT) PO TABS
1000.0000 [IU] | ORAL_TABLET | Freq: Every day | ORAL | Status: DC
  Administered 2022-10-02: 1000 [IU] via ORAL
  Filled 2022-10-01: qty 1

## 2022-10-01 MED ORDER — LIP MEDEX EX OINT
TOPICAL_OINTMENT | CUTANEOUS | Status: DC | PRN
  Administered 2022-10-01: 75 via TOPICAL
  Filled 2022-10-01: qty 7
  Filled 2022-10-01: qty 6.3

## 2022-10-01 MED ORDER — ACETAMINOPHEN 325 MG PO TABS
650.0000 mg | ORAL_TABLET | Freq: Four times a day (QID) | ORAL | Status: DC | PRN
  Administered 2022-10-01: 650 mg via ORAL
  Filled 2022-10-01: qty 2

## 2022-10-01 MED ORDER — AMLODIPINE BESYLATE 5 MG PO TABS
5.0000 mg | ORAL_TABLET | Freq: Every day | ORAL | Status: DC
  Administered 2022-10-02 – 2022-10-03 (×2): 5 mg via ORAL
  Filled 2022-10-01 (×2): qty 1

## 2022-10-01 MED ORDER — MIRTAZAPINE 15 MG PO TABS
15.0000 mg | ORAL_TABLET | Freq: Every day | ORAL | Status: DC
  Administered 2022-10-01 – 2022-10-02 (×2): 15 mg via ORAL
  Filled 2022-10-01 (×3): qty 1

## 2022-10-01 MED ORDER — MEMANTINE HCL 10 MG PO TABS
10.0000 mg | ORAL_TABLET | Freq: Two times a day (BID) | ORAL | Status: DC
  Administered 2022-10-01 – 2022-10-03 (×4): 10 mg via ORAL
  Filled 2022-10-01 (×5): qty 1

## 2022-10-01 MED ORDER — SODIUM CHLORIDE 0.9 % IV SOLN
1.0000 g | INTRAVENOUS | Status: DC
  Administered 2022-10-02: 1 g via INTRAVENOUS
  Filled 2022-10-01: qty 10

## 2022-10-01 MED ORDER — ATORVASTATIN CALCIUM 10 MG PO TABS
10.0000 mg | ORAL_TABLET | Freq: Every day | ORAL | Status: DC
  Administered 2022-10-01: 10 mg via ORAL
  Filled 2022-10-01: qty 1

## 2022-10-01 MED ORDER — LABETALOL HCL 100 MG PO TABS
100.0000 mg | ORAL_TABLET | Freq: Two times a day (BID) | ORAL | Status: DC
  Administered 2022-10-01 – 2022-10-03 (×4): 100 mg via ORAL
  Filled 2022-10-01 (×4): qty 1

## 2022-10-01 MED ORDER — FENTANYL CITRATE PF 50 MCG/ML IJ SOSY
12.5000 ug | PREFILLED_SYRINGE | Freq: Once | INTRAMUSCULAR | Status: AC
  Administered 2022-10-01: 12.5 ug via INTRAVENOUS
  Filled 2022-10-01: qty 1

## 2022-10-01 MED ORDER — SODIUM CHLORIDE 0.9 % IV SOLN: INTRAVENOUS | Status: DC

## 2022-10-01 MED ORDER — DONEPEZIL HCL 10 MG PO TABS
10.0000 mg | ORAL_TABLET | Freq: Every day | ORAL | Status: DC
  Administered 2022-10-01 – 2022-10-02 (×2): 10 mg via ORAL
  Filled 2022-10-01 (×3): qty 1

## 2022-10-01 MED ORDER — SODIUM CHLORIDE 0.9 % IV SOLN: Freq: Once | INTRAVENOUS | Status: AC

## 2022-10-01 MED ORDER — ACETAMINOPHEN 500 MG PO TABS
1000.0000 mg | ORAL_TABLET | Freq: Once | ORAL | Status: AC
  Administered 2022-10-01: 1000 mg via ORAL
  Filled 2022-10-01: qty 2

## 2022-10-01 MED ORDER — SODIUM CHLORIDE 0.9 % IV SOLN
1.0000 g | Freq: Once | INTRAVENOUS | Status: AC
  Administered 2022-10-01: 1 g via INTRAVENOUS
  Filled 2022-10-01: qty 10

## 2022-10-01 NOTE — Assessment & Plan Note (Signed)
Patient at baseline is alert and oriented to self, family and place.  Unable to find much history. -Now mostly bedbound with contracted lower extremity -Continue home medication -Son requested to speak with palliative care here for pain management and goals of care.  We will need to consult in the morning.

## 2022-10-01 NOTE — Assessment & Plan Note (Signed)
-  Normotensive on presentation -Continue home amlodipine, labetalol

## 2022-10-01 NOTE — ED Notes (Signed)
Notified MD about lab recollect

## 2022-10-01 NOTE — H&P (Signed)
History and Physical    Patient: Christina Perkins GGY:[id number] DOB: [birth year] DOA: 10/01/2022 DOS: the patient was seen and examined on 10/01/2022 PCP: Ngetich, Donalee Citrin, NP  Patient coming from: Home  Chief Complaint:  Chief Complaint  Patient presents with   Seizures   HPI: Christina Perkins is a 81 y.o. female with medical history significant of Dementia,stroke hypertension, CKD 3B who presents with seizure-like activity.   Son at bedside provides history.  States that caretaker was with the patient this morning and saw her eyes rolled back with low heart rate.  Unclear how low.  Unclear if there was any shaking of the extremities.  She is incontinent at baseline.  Son reports that patient previously could ambulate with walker but in the past several months has continued to decline with recurrent UTI and hospitalization.  She was discharged to [facility] at the end of September and received suboptimal care per son.  She had falls and was found laying in her feces on several occasions.  Patient just returned home on 09/13/2022 and has now become bedbound with contracted lower extremity.  Now has difficulty feeding herself and requires full care of ADLs from family members and caretaker.  At baseline, she is alert and oriented to self, immediate family and place. Currently she is only complaining of pain to her left foot and  buttocks.  In the ED, she was afebrile, BP of 139/77 on room air.  CBC unremarkable.  Sodium 142, K of 3.1, CO2 of 20, creatinine of 0.68.  Corrected calcium of 8.6.  Negative flu/COVID PCR. Negative UDS.   CT head is negative.  Right hip x-ray and left foot x-ray shows only osteo arthritis.  UA is grossly positive with nitrate and negative leukocytes and many bacteria.   Neurology has been consulted and recommends EEG and MRI brain for further evaluation.  Hospitalist then consulted for admission.   Review of Systems: As mentioned in the history of present  illness. All other systems reviewed and are negative. Past Medical History:  Diagnosis Date   Abnormal chest x-ray    Abnormal results of liver function studies    Abnormal thyroid screen (blood)    Acute sinusitis    Alcohol screening    Anemia, deficiency    Arthropathy    BMI 22.0-22.9, adult    Carotid stenosis, bilateral    Carotid stenosis, bilateral    Cellulitis    Chronic kidney disease, stage III (moderate) (HCC)    Closed fracture of sacrum and coccyx, with routine healing, subsequent encounter    Colon cancer screening    Colon polyp    Dementia (HCC)    Dizzy spells    E. coli UTI 08/14/2022   Encounter for screening mammogram for breast cancer    Facet arthropathy, lumbar    Fatty pancreas    Generalized abdominal or pelvic swelling or mass or lump    Glaucoma screening    H/O mammogram 2019   Per [facility] new patient packet   Hepatomegalia    High risk medication use    Hospital discharge follow-up    Hx of colonoscopy 2016   Per [facility] new patient packet   Hypertension    Hypertriglyceridemia    Influenza vaccination declined by patient    Low back pain    Lumbar disc disease with radiculopathy    Lung nodule    Magnesium deficiency    Medical non-compliance    Menopause    Microscopic  hematuria    Mixed dyslipidemia    Neurodegenerative cognitive impairment (HCC)    Osteoarthritis, hip, bilateral    Overweight    Personal history of smoking    Plantar fasciitis    Poor appetite    Refusal of influenza vaccine by provider    Renal cyst, left    Right atrial enlargement    Right hip pain    Sciatica of right side    Screening for depression    Screening for HIV (human immunodeficiency virus)    Screening for STDs (sexually transmitted diseases)    Senile dementia without behavioral disturbance (HCC)    Shortness of breath    Spondylolisthesis, grade 1    Unintentional weight loss    URI, acute    Urinary frequency    UTI (urinary tract infection),  bacterial    Vitamin D deficiency    No past surgical history on file. Social History:  reports that she has quit smoking. She has never used smokeless tobacco. She reports that she does not currently use alcohol. She reports that she does not use drugs.  No Known Allergies  Family History  Problem Relation Age of Onset   Dementia Mother    Alzheimer's disease Mother    Hypertension Mother    Dementia Father    Blindness Father    Hypertension Father    Stroke Father     Prior to Admission medications   Medication Sig Start Date End Date Taking? Authorizing Provider  acetaminophen (TYLENOL) 500 MG tablet Take 2 tablets (1,000 mg total) by mouth in the morning and at bedtime. 07/27/20  Yes Ngetich, Dinah C, NP  amLODipine (NORVASC) 5 MG tablet Take 1 tablet (5 mg total) by mouth daily. 09/29/22  Yes Ngetich, Dinah C, NP  atorvastatin (LIPITOR) 10 MG tablet TAKE 1 TABLET BY MOUTH AT  BEDTIME Patient taking differently: Take 10 mg by mouth at bedtime. 07/28/22  Yes Ngetich, Dinah C, NP  cholecalciferol (VITAMIN D3) 25 MCG (1000 UNIT) tablet Take 1,000 Units by mouth daily.   Yes [provider]  cloNIDine (CATAPRES) 0.1 MG tablet Take 1 tablet (0.1 mg total) by mouth every 4 (four) hours as needed (SBP >170 or DBP > 100). Patient taking differently: Take 0.1 mg by mouth every 4 (four) hours as needed (SBP > 170 - OR - DBP > 100). 08/19/22  Yes Drema Dallas, MD  donepezil (ARICEPT) 10 MG tablet Take 10 mg by mouth at bedtime. Before Bedtime.   Yes [provider]  labetalol (NORMODYNE) 100 MG tablet Take 1 tablet (100 mg total) by mouth 2 (two) times daily. 08/19/22  Yes Drema Dallas, MD  melatonin 10 MG TABS Take 10 mg by mouth at bedtime as needed. 08/19/22  Yes Drema Dallas, MD  memantine (NAMENDA) 10 MG tablet Take 10 mg by mouth 2 (two) times daily.   Yes [provider]  mirtazapine (REMERON) 15 MG tablet Take 1 tablet (15 mg total) by mouth at  bedtime. 08/19/22  Yes Drema Dallas, MD  ondansetron (ZOFRAN) 4 MG tablet Take 1 tablet (4 mg total) by mouth every 6 (six) hours as needed for nausea. 08/19/22  Yes Drema Dallas, MD    Physical Exam: Vitals:   10/01/22 2000 10/01/22 2015 10/01/22 2030 10/01/22 2115  BP: (!) 140/76 137/74 131/76 132/73  Pulse: 94 91 90 86  Resp:  16 15 16   Temp:      TempSrc:  SpO2: 98% 98% 100% 98%  Weight:      Height:       Constitutional: NAD, calm, comfortable, thin cachectic appearing elderly female laying flat in bed Eyes: PERRL, lids and conjunctivae normal ENMT: Mucous membranes are moist.  Neck: normal, supple Respiratory: clear to auscultation bilaterally, no wheezing, no crackles. Normal respiratory effort. No accessory muscle use.  Cardiovascular: Regular rate and rhythm, no murmurs / rubs / gallops. No extremity edema.  Abdomen: no tenderness,  Bowel sounds positive.  Musculoskeletal: no clubbing / cyanosis. No joint deformity upper and lower extremities.  Bilateral lower extremity contracted in flexion with severe pain when active extension attempted.  Pain with minimal palpation of left foot. Skin: no rashes, lesions, ulcers.  No sacral ulcer or any skin breakdowns to  back Neurologic: CN 2-12 grossly intact.  2 out of 5 strength of lower extremity-unable to lift extremity of the bed due to likely contracted hamstrings  psychiatric: Patient with dementia oriented to place only today. Data Reviewed:  See HPI  Assessment and Plan: * Seizure (HCC) Seizure-like activity suspect more likely acute metabolic encephalopathy from UTI -EEG and MRI is pending per neurology -Continue seizure precaution -Replete electrolytes as below -check CK  Stage 3b chronic kidney disease (CKD) (HCC) Creatinine is stable at baseline  Late onset Alzheimer's dementia without behavioral disturbance (HCC) Patient at baseline is alert and oriented to self, family and place.  Unable to find much  history. -Now mostly bedbound with contracted lower extremity -Continue home medication -Son requested to speak with palliative care here for pain management and goals of care.  We will need to consult in the morning.  Essential hypertension -Normotensive on presentation -Continue home amlodipine, labetalol  Hypocalcemia Calcium of 8.6.  Replete with oral calcium. -Check magnesium and replete as needed  UTI (urinary tract infection) -hx of recurrent UTI with pan-sensitivity in the past -continue daily IV Rocephin with urine culture pending  Hypokalemia Replete with IV potassium      Advance Care Planning:   Code Status: DNR DNR per son at bedside  Consults: Neurology  Family Communication: Discussed with son at bedside  Severity of Illness: The appropriate patient status for this patient is OBSERVATION. Observation status is judged to be reasonable and necessary in order to provide the required intensity of service to ensure the patient's safety. The patient's presenting symptoms, physical exam findings, and initial radiographic and laboratory data in the context of their medical condition is felt to place them at decreased risk for further clinical deterioration. Furthermore, it is anticipated that the patient will be medically stable for discharge from the hospital within 2 midnights of admission.   Author: Anselm Jungling, DO 10/01/2022 10:10 PM  For on call review [email or web address].

## 2022-10-01 NOTE — ED Triage Notes (Signed)
Pt BIB [facility] from home c/o seizure like activity that lasted for 6 minutes. Pt had tonic clonic movement in the upper body. Pt does not have a hx of seizure that the family is aware of. Family lives with pt but was not at home but a home health aid was there. Pt is bed bound. Pt has a 22g in the left hand and received 750 LR with EMS. Pt has hx of dementia and is alert and oriented x2.

## 2022-10-01 NOTE — Progress Notes (Signed)
EEG complete - results pending 

## 2022-10-01 NOTE — ED Notes (Signed)
Messaged phlebotomy about collecting repeat trop d/t pt hard stick

## 2022-10-01 NOTE — Procedures (Signed)
Patient Name: Christina Perkins  MRN: [id number]  Epilepsy Attending: Charlsie Quest  Referring Physician/Provider: Gordy Councilman, MD  Date: 10/01/2022 Duration: 25.13 mins  Patient history: 81yo F with ams. EEG to evaluate for seizure  Level of alertness: Awake  AEDs during EEG study: None  Technical aspects: This EEG study was done with scalp electrodes positioned according to the 10-20 International system of electrode placement. Electrical activity was reviewed with band pass filter of 1-70Hz , sensitivity of 7 uV/mm, display speed of 77mm/sec with a 60Hz  notched filter applied as appropriate. EEG data were recorded continuously and digitally stored.  Video monitoring was available and reviewed as appropriate.  Description: No clear posterior dominant rhythm was seen. EEG showed continuous generalized 5 to 6 Hz theta admixed with intermittent generalized 2-3Hz  delta slowing. Hyperventilation and photic stimulation were not performed.     ABNORMALITY - Continuous slow, generalized  IMPRESSION: This study is suggestive of moderate diffuse encephalopathy, nonspecific etiology. No seizures or epileptiform discharges were seen throughout the recording.  Christina Perkins 

## 2022-10-01 NOTE — ED Notes (Signed)
Failed attempt to collect labs   

## 2022-10-01 NOTE — ED Notes (Signed)
Per Jeraldine Loots MD, hold off on 2nd trop collection at this time. Will collect 2nd trop if 1st one is not WNL

## 2022-10-01 NOTE — Consult Note (Signed)
Neurology Consultation Reason for Consult: Concern for seizure Requesting Physician: Benita Gutter  CC: Seizure-like activity  History is obtained from: Son at bedside and chart review  HPI: Christina Perkins is a 81 y.o. female with a past medical history significant for hyperlipidemia, Alzheimer's dementia, avascular necrosis of the right hip, malnutrition, recurrent UTIs  Per family she was at home when her caregiver noted her eyes rolling back nad her bilateral upper extremities shaking a bit, reportedly with low heart rates.  They asked her to call EMS and a second episode was noted on EMS arrival reportedly lasting approximately 6 minutes, she was given fluid and transported and improved by the time of her ED presentation.  She has had reduced intake for the last 2 days and has found to have a UTI here.  Since her last hospitalization/rehab placement she has become bedbound and requires assistance with eating and other ADLs.  Family additionally reports a similar episode approximately 1 month ago in the setting of a UTI.  They noted an episode of transient left facial droop at that time as well, with concern for stroke on MRI (MRI consistent with chronic microvascular changes no acute stroke)  Regarding seizure risk factors, no history of meningitis/encephalitis, significant head injury, family history of seizures, intracerebral hemorrhage or surgery, personal history of seizures, but she does have advancing dementia  ROS: Unable to obtain due to altered mental status.   Past Medical History:  Diagnosis Date   Abnormal chest x-ray    Abnormal results of liver function studies    Abnormal thyroid screen (blood)    Acute sinusitis    Alcohol screening    Anemia, deficiency    Arthropathy    BMI 22.0-22.9, adult    Carotid stenosis, bilateral    Carotid stenosis, bilateral    Cellulitis    Chronic kidney disease, stage III (moderate) (HCC)    Closed fracture of sacrum and coccyx, with  routine healing, subsequent encounter    Colon cancer screening    Colon polyp    Dementia (HCC)    Dizzy spells    E. coli UTI 08/14/2022   Encounter for screening mammogram for breast cancer    Facet arthropathy, lumbar    Fatty pancreas    Generalized abdominal or pelvic swelling or mass or lump    Glaucoma screening    H/O mammogram 2019   Per [facility] new patient packet   Hepatomegalia    High risk medication use    Hospital discharge follow-up    Hx of colonoscopy 2016   Per [facility] new patient packet   Hypertension    Hypertriglyceridemia    Influenza vaccination declined by patient    Low back pain    Lumbar disc disease with radiculopathy    Lung nodule    Magnesium deficiency    Medical non-compliance    Menopause    Microscopic hematuria    Mixed dyslipidemia    Neurodegenerative cognitive impairment (HCC)    Osteoarthritis, hip, bilateral    Overweight    Personal history of smoking    Plantar fasciitis    Poor appetite    Refusal of influenza vaccine by provider    Renal cyst, left    Right atrial enlargement    Right hip pain    Sciatica of right side    Screening for depression    Screening for HIV (human immunodeficiency virus)    Screening for STDs (sexually transmitted diseases)    Senile  dementia without behavioral disturbance (HCC)    Shortness of breath    Spondylolisthesis, grade 1    Unintentional weight loss    URI, acute    Urinary frequency    UTI (urinary tract infection), bacterial    Vitamin D deficiency    No past surgical history on file.  Current Outpatient Medications  Medication Instructions   acetaminophen (TYLENOL) 1,000 mg, Oral, 2 times daily   amLODipine (NORVASC) 5 mg, Oral, Daily   atorvastatin (LIPITOR) 10 MG tablet TAKE 1 TABLET BY MOUTH AT  BEDTIME   cholecalciferol (VITAMIN D3) 1,000 Units, Oral, Daily   cloNIDine (CATAPRES) 0.1 mg, Oral, Every 4 hours PRN   donepezil (ARICEPT) 10 mg, Oral, Daily at bedtime, Before  Bedtime.   labetalol (NORMODYNE) 100 mg, Oral, 2 times daily   Melatonin 10 mg, Oral, At bedtime PRN   memantine (NAMENDA) 10 mg, Oral, 2 times daily   mirtazapine (REMERON) 15 mg, Oral, Daily at bedtime   ondansetron (ZOFRAN) 4 mg, Oral, Every 6 hours PRN     Family History  Problem Relation Age of Onset   Dementia Mother    Alzheimer's disease Mother    Hypertension Mother    Dementia Father    Blindness Father    Hypertension Father    Stroke Father      Social History:  reports that she has quit smoking. She has never used smokeless tobacco. She reports that she does not currently use alcohol. She reports that she does not use drugs.   Exam: Current vital signs: BP 137/77   Pulse 96   Temp 97.8 F (36.6 C) (Oral)   Resp 18   Ht 5\' 7"  (1.702 m)   Wt 66.4 kg   SpO2 95%   BMI 22.93 kg/m  Vital signs in last 24 hours: Temp:  [97.5 F (36.4 C)-97.8 F (36.6 C)] 97.8 F (36.6 C) (11/15 1826) Pulse Rate:  [85-97] 96 (11/15 1915) Resp:  [15-18] 18 (11/15 1915) BP: (129-153)/(68-81) 137/77 (11/15 1915) SpO2:  [95 %-100 %] 95 % (11/15 1915) Weight:  [66.4 kg] 66.4 kg (11/15 1451)   Physical Exam  Constitutional: Appears chronically ill, then Psych: Affect initially calm but during evaluation becomes quite anxious Eyes: No scleral injection HENT: No oropharyngeal obstruction.  MSK: no joint deformities.  Cardiovascular: Normal rate and regular rhythm on the monitor, perfusing extremities well Respiratory: Effort normal, non-labored breathing GI: Soft.  No distension. There is no tenderness.  Skin: Warm dry and intact visible skin  Neuro: Mental Status: Patient is sleepy in proportion to time of day, but interacts well with examiner.  She follows simple commands, has poor attention/concentration but no clear aphasia, able to repeat simple but not complex sentences Cranial Nerves: II: Visual Fields are full to orienting to stimuli in all quadrants. Pupils are  equal, round, and reactive to light.   III,IV, VI: EOMI to tracking examiner V: Facial sensation is symmetric to light touch VII: Facial movement is notable for subtle left facial droop baseline per family VIII: hearing is intact to voice X: Uvula elevates symmetrically XI: Shoulder shrug is symmetric. XII: tongue is midline without atrophy or fasciculations.  Motor: Slight pronator drift in the bilateral upper extremities.  Exam was truncated due to the patient beginning to cry out in pain, initially reporting right leg pain from her known avascular necrosis of the hip and then reporting severe chest pain and heaviness in her chest and feeling like her throat is  closing.  This gradually abated.  She was able to wiggle bilateral toes but was hesitant to move her leg secondary to pain Sensory: Intact to light touch in all 4 extremities Deep Tendon Reflexes: 2+ and symmetric in the brachioradialis and patellae, winced significantly with patellar reflex testing Cerebellar: Finger-nose intact with a slight intention tremor bilaterally.  Heel-to-shin deferred by patient due to pain Gait:  Nonambulatory at baseline  NIHSS total *** Score breakdown: *** Performed at *** time of patient arrival to ED    I have reviewed labs in epic and the results pertinent to this consultation are:  Basic Metabolic Panel: Recent Labs  Lab 10/01/22 1456 10/01/22 1927 10/01/22 2010  NA 141 142  --   K 3.7 3.1*  --   CL 102 110  --   CO2  --  20*  --   GLUCOSE 83 76  --   BUN 17 14  --   CREATININE 0.90 0.68  --   CALCIUM  --  7.7*  --   MG  --   --  1.5*    CBC: Recent Labs  Lab 10/01/22 1441 10/01/22 1456  WBC 7.3  --   NEUTROABS 4.7  --   HGB 12.9 15.0  HCT 39.0 44.0  MCV 92.2  --   PLT 256  --     Coagulation Studies: Recent Labs    10/01/22 1441  LABPROT 13.8  INR 1.1      I have reviewed the images obtained:  Head CT 1. No acute intracranial abnormality. 2. Mild  cerebral atrophy and chronic microvascular ischemic changes of the periventricular white matter, unchanged.  Impression: Overall event as described sounds more consistent with potentially syncopal convulsion given low heart rates, UTI, bilateral shaking, and no seizure risk factors other than age and dementia.  However as age and dementia are risk factors for this patient and UTI and electrolyte derangements on admission may have played a role in lowering seizure threshold, will complete basic work-up.  Signs of symptoms reviewed with patient's son at bedside in detail.  Please note that my examination was limited due to patient distress from chest pain, I asked for EKG and notified admitting internal medicine provider  Initial Recommendations: - MRI brain w/ and w/o (ordered) - EEG - Appreciate management of UTI, chest pain and other comorbidities per primary team  EEG: - Continuous slow, generalized  MRI brain w/ and w/o ***  Brooke Dare MD-PhD [facility] [phone] Available 7 PM to 7 AM, outside of these hours please call Neurologist on call as listed on Amion.

## 2022-10-01 NOTE — Telephone Encounter (Signed)
Nurse w/ [facility] was advised it was okay to do an order for UA.

## 2022-10-01 NOTE — Assessment & Plan Note (Signed)
Creatinine is stable at baseline

## 2022-10-01 NOTE — Assessment & Plan Note (Addendum)
Seizure-like activity suspect more likely acute metabolic encephalopathy from UTI -EEG and MRI is pending per neurology -Continue seizure precaution -Replete electrolytes as below -check CK

## 2022-10-01 NOTE — Assessment & Plan Note (Signed)
Calcium of 8.6.  Replete with oral calcium. -Check magnesium and replete as needed

## 2022-10-01 NOTE — Assessment & Plan Note (Signed)
-  hx of recurrent UTI with pan-sensitivity in the past -continue daily IV Rocephin with urine culture pending

## 2022-10-01 NOTE — Assessment & Plan Note (Signed)
Replete with IV potassium

## 2022-10-01 NOTE — ED Provider Notes (Signed)
Care of the patient assumed at signout.  On signout the patient was awaiting multiple lab results including urinalysis, CHEM panel.  I discussed findings with the patient's son.  Findings most consistent with urinary tract infection.  With concern for altered mental status, possible seizure-like activity have also discussed her case with our neurology colleagues and she will be followed by their team in a consulting role.  Recommendation initially for MRI, consideration of EEG.  Admitted for further monitoring, management.   Gerhard Munch, MD 10/01/22 2105

## 2022-10-01 NOTE — ED Notes (Signed)
Iv team consult placed for better access and blood draw d/t pt very difficult stick

## 2022-10-01 NOTE — ED Provider Notes (Signed)
[facility] EMERGENCY DEPARTMENT Provider Note   CSN: [id number] Arrival date & time: 10/01/22  1256     History {Add pertinent medical, surgical, social history, OB history to HPI:1} Chief Complaint  Patient presents with   Seizures    Christina Perkins is a 81 y.o. female.  81 year old female with prior medical history as detailed below presents from home with EMS transport.  Patient was sent by caregiver at home who reported seizure-like activity.  Patient without prior history of seizure.  Patient with a brief period of unresponsiveness that was accompanied by arms being crossed in front of her chest and shaking.  EMS reports the patient was somewhat confused upon initial evaluation.  This confusion has improved significantly during transport.  EMS reports the family is concerned about possible concurrent UTI.  Patient with decreased p.o. intake for the last 3 to 4 days.  Patient with concentrated urine per EMS report.  Patient with complaint of right hip pain on initial evaluation.  She denies other pain.  She denies chest pain or shortness of breath.  Patient appears to be primarily bedbound at baseline.    The history is provided by the patient and medical records.       Home Medications Prior to Admission medications   Medication Sig Start Date End Date Taking? Authorizing Provider  acetaminophen (TYLENOL) 500 MG tablet Take 2 tablets (1,000 mg total) by mouth in the morning and at bedtime. 07/27/20   Ngetich, Dinah C, NP  amLODipine (NORVASC) 5 MG tablet Take 1 tablet (5 mg total) by mouth daily. 09/29/22   Ngetich, Dinah C, NP  atorvastatin (LIPITOR) 10 MG tablet TAKE 1 TABLET BY MOUTH AT  BEDTIME Patient taking differently: Take 10 mg by mouth at bedtime. 07/28/22   Ngetich, Dinah C, NP  cholecalciferol (VITAMIN D3) 25 MCG (1000 UNIT) tablet Take 1,000 Units by mouth daily.    [provider]  cloNIDine (CATAPRES) 0.1 MG tablet Take 1 tablet  (0.1 mg total) by mouth every 4 (four) hours as needed (SBP >170 or DBP > 100). Patient taking differently: Take 0.1 mg by mouth every 4 (four) hours as needed (SBP > 170 - OR - DBP > 100). 08/19/22   Drema Dallas, MD  donepezil (ARICEPT) 10 MG tablet Take 10 mg by mouth at bedtime. Before Bedtime.    [provider]  labetalol (NORMODYNE) 100 MG tablet Take 1 tablet (100 mg total) by mouth 2 (two) times daily. 08/19/22   Drema Dallas, MD  melatonin 10 MG TABS Take 10 mg by mouth at bedtime as needed. Patient not taking: Reported on 08/26/2022 08/19/22   Drema Dallas, MD  memantine (NAMENDA) 10 MG tablet Take 10 mg by mouth 2 (two) times daily.    [provider]  mirtazapine (REMERON) 15 MG tablet Take 1 tablet (15 mg total) by mouth at bedtime. 08/19/22   Drema Dallas, MD  ondansetron (ZOFRAN) 4 MG tablet Take 1 tablet (4 mg total) by mouth every 6 (six) hours as needed for nausea. 08/19/22   Drema Dallas, MD      Allergies    Patient has no known allergies.    Review of Systems   Review of Systems  All other systems reviewed and are negative.   Physical Exam Updated Vital Signs SpO2 98%  Physical Exam Vitals and nursing note reviewed.  Constitutional:      General: She is not in acute distress.  Appearance: Normal appearance. She is well-developed.  HENT:     Head: Normocephalic and atraumatic.  Eyes:     Conjunctiva/sclera: Conjunctivae normal.     Pupils: Pupils are equal, round, and reactive to light.  Cardiovascular:     Rate and Rhythm: Normal rate and regular rhythm.     Heart sounds: Normal heart sounds.  Pulmonary:     Effort: Pulmonary effort is normal. No respiratory distress.     Breath sounds: Normal breath sounds.  Abdominal:     General: There is no distension.     Palpations: Abdomen is soft.     Tenderness: There is no abdominal tenderness.  Musculoskeletal:        General: Tenderness present. No deformity. Normal range of  motion.     Cervical back: Normal range of motion and neck supple.     Comments: Significant tenderness elicited from the right hip with attempted movement.  Skin:    General: Skin is warm and dry.     Comments: Early skin breakdown noted at heel of left foot.  Concerning for possible developing pressure ulcer  Neurological:     General: No focal deficit present.     Mental Status: She is alert and oriented to person, place, and time. Mental status is at baseline.     ED Results / Procedures / Treatments   Labs (all labs ordered are listed, but only abnormal results are displayed) Labs Reviewed  RESP PANEL BY RT-PCR (FLU A&B, COVID) ARPGX2  CULTURE, BLOOD (ROUTINE X 2)  CULTURE, BLOOD (ROUTINE X 2)  URINALYSIS, ROUTINE W REFLEX MICROSCOPIC  RAPID URINE DRUG SCREEN, HOSP PERFORMED  CBC WITH DIFFERENTIAL/PLATELET  PROTIME-INR  COMPREHENSIVE METABOLIC PANEL  LACTIC ACID, PLASMA  LACTIC ACID, PLASMA  I-STAT CHEM 8, ED  CBG MONITORING, ED  TROPONIN I (HIGH SENSITIVITY)  TROPONIN I (HIGH SENSITIVITY)    EKG None  Radiology DG Chest Port 1 View  Result Date: 10/01/2022 CLINICAL DATA:  Fever EXAM: PORTABLE CHEST 1 VIEW COMPARISON:  08/26/2022 FINDINGS: Cardiac size is within normal limits. There are no signs of pulmonary edema or focal pulmonary consolidation. There is no pleural effusion or pneumothorax. There is asymmetric pleural density in the left apex with no significant change. Surgical staples are seen in the left apex. There is small calcification between acromion and right humeral head suggesting possible calcific tendinosis. Degenerative changes are noted in both AC joints, more so on the left side. IMPRESSION: There are no signs of pulmonary edema or focal pulmonary consolidation. Other findings as described in the body of the report. Electronically Signed   By: Ernie Avena M.D.   On: 10/01/2022 13:18    Procedures Procedures  {Document cardiac monitor,  telemetry assessment procedure when appropriate:1}  Medications Ordered in ED Medications  sodium chloride 0.9 % bolus 500 mL (has no administration in time range)  0.9 %  sodium chloride infusion (has no administration in time range)    ED Course/ Medical Decision Making/ A&P                           Medical Decision Making Amount and/or Complexity of Data Reviewed Labs: ordered. Radiology: ordered.  Risk Prescription drug management.    Medical Screen Complete  This patient presented to the ED with complaint of possible seizure, altered mental status, decreased p.o. intake  This complaint involves an extensive number of treatment options. The initial differential diagnosis includes, but  is not limited to, metabolic abnormality, dehydration, seizure, infection, etc.  This presentation is: Acute, Chronic, Self-Limited, Previously Undiagnosed, Uncertain Prognosis, Complicated, Systemic Symptoms, and Threat to Life/Bodily Function  Patient is presenting from home after apparent seizure-like episode.  Patient with shaking of upper extremities and apparent decreased responsiveness for approximately 5 minutes.  Patient was mildly postictal with initial EMS evaluation.  Patient appears to be at baseline mental status upon arrival to the ED.  Patient without prior history of seizure reported.  Patient does appear to be clinically dehydrated.  Early skin breakdown noted on the left heel consistent with possible early decubitus.  CT head without significant acute abnormality.  Plain films obtained of the chest, left foot, and right hip are without significant acute abnormality.  Patient with advanced osteoarthritis in the right hip.     Co morbidities that complicated the patient's evaluation  Dementia   Additional history obtained:  Additional history obtained from family, EMS External records from outside sources obtained and reviewed including prior ED visits and prior  Inpatient records.    Lab Tests:  I ordered and personally interpreted labs.  The pertinent results include: CBC, CMP, CBG, COVID, flu, troponin, lactic acid   Imaging Studies ordered:  I ordered imaging studies including chest x-ray, pelvis and right hip, left foot, and CT head I independently visualized and interpreted obtained imaging which showed chest x-ray is without acute abnormality I agree with the radiologist interpretation.   Cardiac Monitoring:  The patient was maintained on a cardiac monitor.  I personally viewed and interpreted the cardiac monitor which showed an underlying rhythm of: NSR   Medicines ordered:  I ordered medication including IV fluids for suspected dehydration Reevaluation of the patient after these medicines showed that the patient: improved  Problem List / ED Course:  Seizure-like activity, suspected dehydration   Reevaluation:  After the interventions noted above, I reevaluated the patient and found that they have: stayed the same   Disposition:  After consideration of the diagnostic results and the patients response to treatment, I feel that the patent would benefit from ***.    {Document critical care time when appropriate:1} {Document review of labs and clinical decision tools ie heart score, Chads2Vasc2 etc:1}  {Document your independent review of radiology images, and any outside records:1} {Document your discussion with family members, caretakers, and with consultants:1} {Document social determinants of health affecting pt's care:1} {Document your decision making why or why not admission, treatments were needed:1} Final Clinical Impression(s) / ED Diagnoses Final diagnoses:  None    Rx / DC Orders ED Discharge Orders     None

## 2022-10-01 NOTE — ED Notes (Signed)
Per Selena Batten in the lab, they received the istat (drk green) tube that was sent to try and run the cmp and trop.

## 2022-10-02 DIAGNOSIS — Z66 Do not resuscitate: Secondary | ICD-10-CM

## 2022-10-02 DIAGNOSIS — R569 Unspecified convulsions: Secondary | ICD-10-CM | POA: Diagnosis not present

## 2022-10-02 DIAGNOSIS — N3000 Acute cystitis without hematuria: Secondary | ICD-10-CM | POA: Diagnosis not present

## 2022-10-02 DIAGNOSIS — Z7189 Other specified counseling: Secondary | ICD-10-CM | POA: Diagnosis not present

## 2022-10-02 DIAGNOSIS — I10 Essential (primary) hypertension: Secondary | ICD-10-CM | POA: Diagnosis not present

## 2022-10-02 DIAGNOSIS — Z789 Other specified health status: Secondary | ICD-10-CM | POA: Diagnosis not present

## 2022-10-02 DIAGNOSIS — Z515 Encounter for palliative care: Secondary | ICD-10-CM | POA: Diagnosis not present

## 2022-10-02 DIAGNOSIS — F0280 Dementia in other diseases classified elsewhere without behavioral disturbance: Secondary | ICD-10-CM | POA: Diagnosis not present

## 2022-10-02 DIAGNOSIS — N1832 Chronic kidney disease, stage 3b: Secondary | ICD-10-CM | POA: Diagnosis not present

## 2022-10-02 DIAGNOSIS — Z711 Person with feared health complaint in whom no diagnosis is made: Secondary | ICD-10-CM

## 2022-10-02 DIAGNOSIS — G301 Alzheimer's disease with late onset: Secondary | ICD-10-CM | POA: Diagnosis not present

## 2022-10-02 DIAGNOSIS — R52 Pain, unspecified: Secondary | ICD-10-CM | POA: Diagnosis not present

## 2022-10-02 DIAGNOSIS — N390 Urinary tract infection, site not specified: Secondary | ICD-10-CM | POA: Diagnosis not present

## 2022-10-02 LAB — BASIC METABOLIC PANEL
Anion gap: 11 (ref 5–15)
BUN: 14 mg/dL (ref 8–23)
CO2: 22 mmol/L (ref 22–32)
Calcium: 8.7 mg/dL — ABNORMAL LOW (ref 8.9–10.3)
Chloride: 109 mmol/L (ref 98–111)
Creatinine, Ser: 0.76 mg/dL (ref 0.44–1.00)
GFR, Estimated: >60 mL/min (ref >60)
Glucose, Bld: 96 mg/dL (ref 70–99)
Potassium: 3.5 mmol/L (ref 3.5–5.1)
Sodium: 142 mmol/L (ref 135–145)

## 2022-10-02 MED ORDER — ACETAMINOPHEN 650 MG RE SUPP: 650.0000 mg | Freq: Four times a day (QID) | RECTAL | Status: DC | PRN

## 2022-10-02 MED ORDER — HALOPERIDOL 0.5 MG PO TABS: 2.0000 mg | ORAL_TABLET | Freq: Four times a day (QID) | ORAL | Status: DC | PRN

## 2022-10-02 MED ORDER — LORAZEPAM 2 MG/ML IJ SOLN: 1.0000 mg | INTRAMUSCULAR | Status: DC | PRN

## 2022-10-02 MED ORDER — POLYVINYL ALCOHOL 1.4 % OP SOLN: 1.0000 [drp] | Freq: Four times a day (QID) | OPHTHALMIC | Status: DC | PRN

## 2022-10-02 MED ORDER — GLYCOPYRROLATE 1 MG PO TABS: 1.0000 mg | ORAL_TABLET | ORAL | Status: DC | PRN

## 2022-10-02 MED ORDER — HALOPERIDOL LACTATE 2 MG/ML PO CONC: 2.0000 mg | Freq: Four times a day (QID) | ORAL | Status: DC | PRN

## 2022-10-02 MED ORDER — GLYCOPYRROLATE 0.2 MG/ML IJ SOLN: 0.2000 mg | INTRAMUSCULAR | Status: DC | PRN

## 2022-10-02 MED ORDER — DIPHENHYDRAMINE HCL 50 MG/ML IJ SOLN: 12.5000 mg | INTRAMUSCULAR | Status: DC | PRN

## 2022-10-02 MED ORDER — LORAZEPAM 1 MG PO TABS: 1.0000 mg | ORAL_TABLET | ORAL | Status: DC | PRN

## 2022-10-02 MED ORDER — ORAL CARE MOUTH RINSE
15.0000 mL | Freq: Two times a day (BID) | OROMUCOSAL | Status: DC
  Administered 2022-10-02 – 2022-10-03 (×2): 15 mL via OROMUCOSAL

## 2022-10-02 MED ORDER — HALOPERIDOL LACTATE 5 MG/ML IJ SOLN: 2.0000 mg | Freq: Four times a day (QID) | INTRAMUSCULAR | Status: DC | PRN

## 2022-10-02 MED ORDER — HYDROMORPHONE HCL 2 MG PO TABS
2.0000 mg | ORAL_TABLET | ORAL | Status: DC | PRN
  Administered 2022-10-03: 2 mg via ORAL
  Filled 2022-10-02: qty 1

## 2022-10-02 MED ORDER — BIOTENE DRY MOUTH MT LIQD: 15.0000 mL | Freq: Two times a day (BID) | OROMUCOSAL | Status: DC

## 2022-10-02 MED ORDER — ACETAMINOPHEN 325 MG PO TABS: 650.0000 mg | ORAL_TABLET | Freq: Four times a day (QID) | ORAL | Status: DC | PRN

## 2022-10-02 MED ORDER — ONDANSETRON 4 MG PO TBDP: 4.0000 mg | ORAL_TABLET | Freq: Four times a day (QID) | ORAL | Status: DC | PRN

## 2022-10-02 MED ORDER — LORAZEPAM 2 MG/ML PO CONC: 1.0000 mg | ORAL | Status: DC | PRN

## 2022-10-02 MED ORDER — ONDANSETRON HCL 4 MG/2ML IJ SOLN: 4.0000 mg | Freq: Four times a day (QID) | INTRAMUSCULAR | Status: DC | PRN

## 2022-10-02 MED ORDER — TRAMADOL-ACETAMINOPHEN 37.5-325 MG PO TABS
1.0000 | ORAL_TABLET | Freq: Three times a day (TID) | ORAL | Status: DC
  Administered 2022-10-02 – 2022-10-03 (×3): 1 via ORAL
  Filled 2022-10-02 (×3): qty 1

## 2022-10-02 NOTE — Plan of Care (Signed)

## 2022-10-02 NOTE — TOC Initial Note (Signed)
Transition of Care [facility]) - Initial/Assessment Note    Patient Details  Name: Christina Perkins MRN: [id number] Date of Birth: 23-Oct-1941  Transition of Care [facility]) CM/SW Contact:    Kermit Balo, RN Phone Number: 10/02/2022, 2:56 PM  Clinical Narrative:                 CM updated per palliative care that family is requesting home with hospice services. They asked for [facility]. Cheri with [facility] will meet with family and arrange needed DME for the home.  TOC following.  Expected Discharge Plan: Home w Hospice Care Barriers to Discharge: Continued Medical Work up   Patient Goals and CMS Choice   CMS Medicare.gov Compare Post Acute Care list provided to:: Patient Represenative (must comment) Choice offered to / list presented to : Adult Children  Expected Discharge Plan and Services Expected Discharge Plan: Home w Hospice Care   Discharge Planning Services: CM Consult Post Acute Care Choice: Durable Medical Equipment, Hospice Living arrangements for the past 2 months: Single Family Home                                      Prior Living Arrangements/Services Living arrangements for the past 2 months: Single Family Home Lives with:: Adult Children          Need for Family Participation in Patient Care: Yes (Comment) Care giver support system in place?: Yes (comment)      Activities of Daily Living      Permission Sought/Granted                  Emotional Assessment           Psych Involvement: No (comment)  Admission diagnosis:  Lower urinary tract infectious disease [N39.0] Seizure (HCC) [R56.9] Seizure-like activity (HCC) [R56.9] Patient Active Problem List   Diagnosis Date Noted   Seizure (HCC) 10/01/2022   Hypocalcemia 10/01/2022   Failure to thrive in adult 08/17/2022   Anorexia 08/17/2022   UTI (urinary tract infection) 08/14/2022   E. coli UTI 08/14/2022   Acute cystitis without hematuria 08/13/2022   Stage 3b chronic  kidney disease (CKD) (HCC) 08/13/2022   Acute bilateral low back pain without sciatica 11/08/2020   Late onset Alzheimer's dementia without behavioral disturbance (HCC) 08/28/2020   Malnutrition of moderate degree 07/19/2020   Weakness 07/18/2020   Generalized weakness 07/18/2020   Fall at home, initial encounter 07/18/2020   Hypokalemia 07/18/2020   Avascular necrosis of femoral head (HCC) 07/18/2020   Essential hypertension 07/18/2020   Injury of toe on right foot 11/07/2013   Pain in toe of right foot 11/07/2013   PCP:  Caesar Bookman, NP Pharmacy:   [facility] [address] [place], [place] - [address] [place] [address] Phone: [phone] Fax: [phone]  [facility] - [place], [place] - [address] [place] [address] Phone: [phone] Fax: [phone]  [facility] [address] [place] [address] Phone: [phone] Fax: [phone]     Social Determinants of Health (SDOH) Interventions    Readmission Risk Interventions    08/19/2022   12:49 PM  Readmission Risk Prevention Plan  Transportation Screening Complete  PCP or Specialist Appt within 5-7 Days Complete  Home Care Screening  Complete  Medication Review (RN CM) Complete

## 2022-10-02 NOTE — ED Notes (Signed)
Patient refused breakfast 

## 2022-10-02 NOTE — Consult Note (Signed)
Consultation Note Date: 10/02/2022   Patient Name: Christina Perkins  DOB: December 01, 1940  MRN: [id number]  Age / Sex: 81 y.o., female  PCP: Ngetich, Nelda Bucks, NP Referring Physician: Albertine Patricia, MD  Reason for Consultation: Establishing goals of care  HPI/Patient Profile: 81 y.o. female  with past medical history of dementia,stroke hypertension, CKD 3B, recurrent UTIs presented to ED on 10/01/22 from home after seizure-like activity. Neurology was consulted - head CT, MRI brain, and EEG were unremarkable. UA positive with nitrate and negative leukocytes and many bacteria. Patient was admitted on 10/01/2022 with seizure like activity suspect more likely acute metabolic encephalopathy from UTI.   Clinical Assessment and Goals of Care: I have reviewed medical records including EPIC notes, labs, and imaging. Received report from primary RN - no acute concerns. RN reports patient is awake, not interactive.   Went to visit patient at bedside - son/Christina Perkins present. Patient was lying in bed intermittently awake/asleep - she is not verbally responsive. No signs or non-verbal gestures of pain or discomfort noted. No respiratory distress, increased work of breathing, or secretions noted.   Met with son/Christina Perkins in person and daughter/Christina Perkins via speakerphone  to discuss diagnosis, prognosis, GOC, EOL wishes, disposition, and options.  I introduced Palliative Medicine as specialized medical care for people living with serious illness. It focuses on providing relief from the symptoms and stress of a serious illness. The goal is to improve quality of life for both the patient and the family.  We discussed a brief life review of the patient as well as functional and nutritional status. Patient has 2 daughters and 1 son. Therapeutic listening provided as family reflect over the last several months - patient was living at home alone,  hospitalized for UTI, discharged to [facility] for rehab for 6 weeks, and returned home. Now, patient requires 24/7 supervision/assistance. Family have hired a Retail banker caregiver for 6hr/day 5 days per week - outside these hours, family take "shifts" caring for patient that ensures 24/7 support. Christina Perkins recognizes patient has been declining over the last several months, including her appetite - albumin noted at 2.4 on 10/01/22.  We discussed patient's current illness and what it means in the larger context of patient's on-going co-morbidities. Family have a clear understanding of patient's current acute medical situation. Education provided that dementia and CKD are progressive, non-curable disease underlying the patient's current acute medical conditions. Natural disease trajectory and expectations at EOL were discussed. I attempted to elicit values and goals of care important to the patient. The difference between aggressive medical intervention and comfort care was considered in light of the patient's goals of care.   Discussed with family that patient is at high risk for rehospitalization - their goals are not to have patient rehospitalized. Family tell me they have been in contact with outpatient Palliative Care who was supposed to be scheduling a meeting with the patient/family; however, this hasn't happened yet.  Discussion on the difference between Palliative and Hospice care. Palliative care and hospice have similar goals of managing symptoms, promoting comfort, improving quality of life, and maintaining a person's dignity. However, palliative care may be offered during any phase of a serious illness, while hospice care is usually offered when a person is expected to live for 6 months or less.  Provided education and counseling at length on the philosophy and benefits of hospice care. Discussed that it offers a holistic approach to care in the setting of end-stage illness, and is about  supporting  the patient where they are allowing nature to take it's course. Discussed the hospice team includes RNs, physicians, social workers, and chaplains. They can provide personal care, support for the family, and help keep patient out of the hospital as well as assist with DME needs for home hospice. Education provided on the difference between home vs residential hospice. Family are interested in home hospice services, requesting [facility] whom they have already been in contact with. Family will be ready for patient to return home once DME is delivered.  We talked about transition to comfort measures in house and what that would entail inclusive of medications to control pain, dyspnea, agitation, nausea, and itching. We discussed stopping all unnecessary measures such as blood draws, needle sticks, oxygen, antibiotics, CBGs/insulin, cardiac monitoring, IVF, and frequent vital signs. Education provided that other non-pharmacological interventions would be utilized for holistic support and comfort such as spiritual support if requested, repositioning, music therapy, offering comfort feeds, and/or therapeutic listening. Family are agreeable for patient's transition to full comfort measures today; they would like to continue to treat UTI while admitted. Their biggest concern is patient's pain. They tell me patient has pain in her back, hip, and feet due to arthritis. Noted patient's history of avascular necrosis of right hip.  Visit also consisted of discussions dealing with the complex and emotionally intense issues of symptom management and palliative care in the setting of serious and potentially life-threatening illness.   Discussed with patient/family the importance of continued conversation with each other and the medical providers regarding overall plan of care and treatment options, ensuring decisions are within the context of the patient's values and GOCs.    Questions and concerns  were addressed. The patient/family was encouraged to call with questions and/or concerns. PMT card was provided.   Primary Decision Maker: NEXT OF KIN - patient's children    SUMMARY OF RECOMMENDATIONS   Initiated full comfort measures Continue DNR/DNI as previously documented Discharge home with hospice once DME is delivered; family requesting [facility] - TOC consult placed; TOC and hospice liaison notified Continue antibiotics for UTI Family's goal is for patient not to be rehospitalized Added orders for EOL symptom management and to reflect full comfort measures, as well as discontinued orders that were not focused on comfort Unrestricted visitation orders were placed per current [facility] EOL visitation policy  Nursing to provide frequent assessments and administer PRN medications as clinically necessary to ensure EOL comfort PMT will continue to follow and support holistically  Symptom Management Ultracet TID  Continue antibiotic for UTI Continue norvasc, aricept, labetalol, namenda, mirtazapine while still tolerating POs Dilaudid PRN pain/dyspnea/increased work of breathing/RR>25 Tylenol PRN pain/fever Biotin twice daily Benadryl PRN itching Robinul PRN secretions Haldol PRN agitation/delirium Ativan PRN anxiety/seizure/sleep/distress Zofran PRN nausea/vomiting Liquifilm Tears PRN dry eye   Code Status/Advance Care Planning: DNR  Palliative Prophylaxis:  Aspiration, Bowel Regimen, Delirium Protocol, Frequent Pain Assessment, Oral Care, and Turn Reposition  Additional Recommendations (Limitations, Scope, Preferences): Full Comfort Care  Psycho-social/Spiritual:  Desire for further Chaplaincy support:no Created space and opportunity for patient and family to express thoughts and feelings regarding patient's current medical situation.  Emotional support and therapeutic listening provided.  Prognosis:  < 6 months  Discharge Planning: Home  with Hospice      Primary Diagnoses: Present on Admission:  Stage 3b chronic kidney disease (CKD) ([place])  Late onset Alzheimer's dementia without behavioral disturbance ([place])  Essential hypertension  UTI (urinary tract infection)  Hypokalemia   I have reviewed the medical record, interviewed the patient and family, and examined the patient. The following aspects are pertinent.  Past Medical History:  Diagnosis Date   Abnormal chest x-ray    Abnormal results of liver function studies    Abnormal thyroid screen (blood)    Acute sinusitis    Alcohol screening    Anemia, deficiency    Arthropathy    BMI 22.0-22.9, adult    Carotid stenosis, bilateral    Carotid stenosis, bilateral    Cellulitis    Chronic kidney disease, stage III (moderate) (HCC)    Closed fracture of sacrum and coccyx, with routine healing, subsequent encounter    Colon cancer screening    Colon polyp    Dementia ([place])    Dizzy spells    E. coli UTI 08/14/2022   Encounter for screening mammogram for breast cancer    Facet arthropathy, lumbar    Fatty pancreas    Generalized abdominal or pelvic swelling or mass or lump    Glaucoma screening    H/O mammogram 2019   Per [facility] new patient packet   Hepatomegalia    High risk medication use    Hospital discharge follow-up    Hx of colonoscopy 2016   Per [facility] new patient packet   Hypertension    Hypertriglyceridemia    Influenza vaccination declined by patient    Low back pain    Lumbar disc disease with radiculopathy    Lung nodule    Magnesium deficiency    Medical non-compliance    Menopause    Microscopic hematuria    Mixed dyslipidemia    Neurodegenerative cognitive impairment (HCC)    Osteoarthritis, hip, bilateral    Overweight    Personal history of smoking    Plantar fasciitis    Poor appetite    Refusal of influenza vaccine by provider    Renal cyst, left    Right atrial enlargement    Right hip pain    Sciatica of right side     Screening for depression    Screening for HIV (human immunodeficiency virus)    Screening for STDs (sexually transmitted diseases)    Senile dementia without behavioral disturbance (HCC)    Shortness of breath    Spondylolisthesis, grade 1    Unintentional weight loss    URI, acute    Urinary frequency    UTI (urinary tract infection), bacterial    Vitamin D deficiency    Social History   Socioeconomic History   Marital status: Divorced    Spouse name: Not on file   Number of children: Not on file   Years of education: Not on file   Highest education level: Not on file  Occupational History   Not on file  Tobacco Use   Smoking status: Former   Smokeless tobacco: Never   Tobacco comments:    Quit at about age 12   Vaping Use   Vaping Use: Never used  Substance and Sexual Activity   Alcohol use: Not Currently    Comment: 6 glasses of wine a week.   Drug use: No   Sexual activity: Not on file  Other Topics Concern   Not on file  Social History Narrative   Diet Unanswered      Do you drink/eat things with caffeine Unanswered      Marital Status: Divorced What year were you married? Unanswered      Do you live in  a house, apartment, assisted living, condo, trailer, etc.? House      Is it one or more stories? One      How many persons live in your home? 1         Do you have any pets in your home?(please list) No      Highest level of education completed: Some college      Current or past profession: Unanswered      Do you exercise?:No    Type and how often:      Do you have a Living Will? Yes, in process      Do you have a DNR form?          If not, would you like to discuss one? unanswered on new patient packet      Do you have signed POA/HPOA forms? Yes      Do you have difficulty bathing or dressing yourself? No      Do you have difficulty preparing food or eating? No      Do you have difficulty managing medications? Yes      Do you have difficulty  managing your finances? Yes      Do you have difficulty affording your medications? No                  Social Determinants of Health   Financial Resource Strain: Low Risk  (09/29/2022)   Overall Financial Resource Strain (CARDIA)    Difficulty of Paying Living Expenses: Not very hard  Food Insecurity: No Food Insecurity (09/29/2022)   Hunger Vital Sign    Worried About Running Out of Food in the Last Year: Never true    Ran Out of Food in the Last Year: Never true  Transportation Needs: No Transportation Needs (09/29/2022)   PRAPARE - Hydrologist (Medical): No    Lack of Transportation (Non-Medical): No  Physical Activity: Not on file  Stress: Not on file  Social Connections: Not on file   Family History  Problem Relation Age of Onset   Dementia Mother    Alzheimer's disease Mother    Hypertension Mother    Dementia Father    Blindness Father    Hypertension Father    Stroke Father    Scheduled Meds:  amLODipine  5 mg Oral Daily   atorvastatin  10 mg Oral QHS   cholecalciferol  1,000 Units Oral Daily   donepezil  10 mg Oral QHS   enoxaparin (LOVENOX) injection  40 mg Subcutaneous Q24H   labetalol  100 mg Oral BID   memantine  10 mg Oral BID   mirtazapine  15 mg Oral QHS   Continuous Infusions:  cefTRIAXone (ROCEPHIN)  IV 1 g (10/02/22 1210)   PRN Meds:.acetaminophen, lip balm Medications Prior to Admission:  Prior to Admission medications   Medication Sig Start Date End Date Taking? Authorizing Provider  acetaminophen (TYLENOL) 500 MG tablet Take 2 tablets (1,000 mg total) by mouth in the morning and at bedtime. 07/27/20  Yes Ngetich, Dinah C, NP  amLODipine (NORVASC) 5 MG tablet Take 1 tablet (5 mg total) by mouth daily. 09/29/22  Yes Ngetich, Dinah C, NP  atorvastatin (LIPITOR) 10 MG tablet TAKE 1 TABLET BY MOUTH AT  BEDTIME Patient taking differently: Take 10 mg by mouth at bedtime. 07/28/22  Yes Ngetich, Dinah C, NP   cholecalciferol (VITAMIN D3) 25 MCG (1000 UNIT) tablet Take 1,000 Units by mouth daily.  Yes [provider]  cloNIDine (CATAPRES) 0.1 MG tablet Take 1 tablet (0.1 mg total) by mouth every 4 (four) hours as needed (SBP >170 or DBP > 100). Patient taking differently: Take 0.1 mg by mouth every 4 (four) hours as needed (SBP > 170 - OR - DBP > 100). 08/19/22  Yes Allie Bossier, MD  donepezil (ARICEPT) 10 MG tablet Take 10 mg by mouth at bedtime. Before Bedtime.   Yes [provider]  labetalol (NORMODYNE) 100 MG tablet Take 1 tablet (100 mg total) by mouth 2 (two) times daily. 08/19/22  Yes Allie Bossier, MD  melatonin 10 MG TABS Take 10 mg by mouth at bedtime as needed. 08/19/22  Yes Allie Bossier, MD  memantine (NAMENDA) 10 MG tablet Take 10 mg by mouth 2 (two) times daily.   Yes [provider]  mirtazapine (REMERON) 15 MG tablet Take 1 tablet (15 mg total) by mouth at bedtime. 08/19/22  Yes Allie Bossier, MD  ondansetron (ZOFRAN) 4 MG tablet Take 1 tablet (4 mg total) by mouth every 6 (six) hours as needed for nausea. 08/19/22  Yes Allie Bossier, MD   No Known Allergies Review of Systems  Unable to perform ROS: Dementia    Physical Exam Vitals and nursing note reviewed.  Constitutional:      General: She is not in acute distress.    Appearance: She is cachectic. She is ill-appearing.  Pulmonary:     Effort: No respiratory distress.  Skin:    General: Skin is warm and dry.  Neurological:     Mental Status: She is lethargic.     Motor: Weakness present.  Psychiatric:        Speech: She is noncommunicative.     Vital Signs: BP (!) 115/58   Pulse 80   Temp 97.6 F (36.4 C) (Oral)   Resp 20   Ht _0  (1.702 m)   Wt 66.4 kg   SpO2 99%   BMI 22.93 kg/m  Pain Scale: 0-10   Pain Score: 0-No pain   SpO2: SpO2: 99 % O2 Device:SpO2: 99 % O2 Flow Rate: .   IO: Intake/output summary:  Intake/Output Summary (Last 24 hours) at 10/02/2022  1226 Last data filed at 10/01/2022 1810 Gross per 24 hour  Intake 503.05 ml  Output --  Net 503.05 ml    LBM:   Baseline Weight: Weight: 66.4 kg Most recent weight: Weight: 66.4 kg     Palliative Assessment/Data: PPS 20%     Time In: 1400 Time Out: 1530 Time Total: 90 minutes  Greater than 50%  of this time was spent counseling and coordinating care related to the above assessment and plan.  Signed by: Lin Landsman, NP   Please contact Palliative Medicine Team phone at [phone] for questions and concerns.  For individual provider: See Amion  *Portions of this note are a verbal dictation therefore any spelling and/or grammatical errors are due to the "[employer]" system interpretation.

## 2022-10-02 NOTE — Progress Notes (Signed)
PROGRESS NOTE    Christina Perkins  VHQ:[id number] DOB: [birth year] DOA: 10/01/2022 PCP: Caesar Bookman, NP   Chief Complaint  Patient presents with   Seizures    Brief Narrative:    Christina Perkins is a 81 y.o. female with medical history significant of Dementia,stroke hypertension, CKD 3B who presents with seizure-like activity.    Son at bedside provides history.  States that caretaker was with the patient this morning and saw her eyes rolled back with low heart rate.  Unclear how low.  Unclear if there was any shaking of the extremities.  She is incontinent at baseline.  Son reports that patient previously could ambulate with walker but in the past several months has continued to decline with recurrent UTI and hospitalization.  She was discharged to [facility] at the end of September and received suboptimal care per son.  She had falls and was found laying in her feces on several occasions.  Patient just returned home on 09/13/2022 and has now become bedbound with contracted lower extremity.  Now has difficulty feeding herself and requires full care of ADLs from family members and caretaker.  At baseline, she is alert and oriented to self, immediate family and place. Currently she is only complaining of pain to her left foot and  buttocks.   In the ED, she was afebrile, BP of 139/77 on room air.   CBC unremarkable.   Sodium 142, K of 3.1, CO2 of 20, creatinine of 0.68.  Corrected calcium of 8.6.   Negative flu/COVID PCR. Negative UDS.    CT head is negative.  Right hip x-ray and left foot x-ray shows only osteo arthritis.   UA is grossly positive with nitrate and negative leukocytes and many bacteria.    Neurology has been consulted and recommends EEG and MRI brain for further evaluation.  Hospitalist then consulted for admission.    Assessment & Plan:   Principal Problem:   Seizure (HCC) Active Problems:   Essential hypertension   Late onset Alzheimer's dementia without  behavioral disturbance (HCC)   Stage 3b chronic kidney disease (CKD) (HCC)   Hypokalemia   UTI (urinary tract infection)   Hypocalcemia   Failure to thrive Late onset Alzheimer's dementia without behavioral disturbance (HCC) -Overall patient with gradual decline over the last 37-month, currently with significant dementia, multiple comorbidities and. Poor life quality -palliative medicine consult greatly appreciated, plan to proceed with full comfort measure, and to discharge home with hospice, likely tomorrow once evaluated by hospice and DME is delivered  UTI (urinary tract infection) -hx of recurrent UTI with pan-sensitivity in the past -continue daily IV Rocephin with urine culture pending Acute metabolic encephalopathy from UTI -She presents with seizure-like activity, but this is most likely in metabolic encephalopathy from UTI, dementia and failure to thrive -no acute findings in MRI with no acute finding, no evidence of seizures on EEG  Stage 3b chronic kidney disease (CKD) (HCC) Creatinine is stable at baseline    Essential hypertension -Normotensive on presentation -Continue home amlodipine, labetalol   Hypocalcemia Calcium of 8.6.  Replete with oral calcium. -Check magnesium and replete as needed    Hypokalemia Replete with IV potassium           DVT prophylaxis: SCD Code Status: DNR  Family Communication: None at bedside Disposition: plan to DC home in am with hospice  Status is: Observation    Consultants:  Palliaitve   Subjective:  Patient  is demented, poor historian, no significant  events as discussed with staff  Objective: Vitals:   10/02/22 0733 10/02/22 1030 10/02/22 1159 10/02/22 1355  BP:  (!) 115/58  126/64  Pulse:  80  85  Resp:  20  19  Temp: 97.6 F (36.4 C)  97.6 F (36.4 C) 98 F (36.7 C)  TempSrc: Oral  Oral Oral  SpO2:  99%  98%  Weight:      Height:        Intake/Output Summary (Last 24 hours) at 10/02/2022 1532 Last  data filed at 10/02/2022 1351 Gross per 24 hour  Intake 597.87 ml  Output --  Net 597.87 ml   Filed Weights   10/01/22 1451  Weight: 66.4 kg    Examination:  Awake, extremely frail, deconditioned, thin appearing, demented Symmetrical Chest wall movement, Good air movement bilaterally, CTAB RRR,No Gallops,Rubs or new Murmurs, No Parasternal Heave +ve B.Sounds, Abd Soft, No tenderness, No rebound - guarding or rigidity. No Cyanosis, Clubbing or edema, No new Rash or bruise      Data Reviewed: I have personally reviewed following labs and imaging studies  CBC: Recent Labs  Lab 10/01/22 1441 10/01/22 1456  WBC 7.3  --   NEUTROABS 4.7  --   HGB 12.9 15.0  HCT 39.0 44.0  MCV 92.2  --   PLT 256  --     Basic Metabolic Panel: Recent Labs  Lab 10/01/22 1456 10/01/22 1927 10/01/22 2010 10/02/22 0135  NA 141 142  --  142  K 3.7 3.1*  --  3.5  CL 102 110  --  109  CO2  --  20*  --  22  GLUCOSE 83 76  --  96  BUN 17 14  --  14  CREATININE 0.90 0.68  --  0.76  CALCIUM  --  7.7*  --  8.7*  MG  --   --  1.5*  --     GFR: Estimated Creatinine Clearance: 52.7 mL/min (by C-G formula based on SCr of 0.76 mg/dL).  Liver Function Tests: Recent Labs  Lab 10/01/22 1927  AST 15  ALT 7  ALKPHOS 59  BILITOT 0.8  PROT 5.0*  ALBUMIN 2.4*    CBG: Recent Labs  Lab 10/01/22 1833  GLUCAP 79     Recent Results (from the past 240 hour(s))  Resp Panel by RT-PCR (Flu A&B, Covid) Anterior Nasal Swab     Status: None   Collection Time: 10/01/22  1:04 PM   Specimen: Anterior Nasal Swab  Result Value Ref Range Status   SARS Coronavirus 2 by RT PCR NEGATIVE NEGATIVE Final    Comment: (NOTE) SARS-CoV-2 target nucleic acids are NOT DETECTED.  The SARS-CoV-2 RNA is generally detectable in upper respiratory specimens during the acute phase of infection. The lowest concentration of SARS-CoV-2 viral copies this assay can detect is 138 copies/mL. A negative result does not  preclude SARS-Cov-2 infection and should not be used as the sole basis for treatment or other patient management decisions. A negative result may occur with  improper specimen collection/handling, submission of specimen other than nasopharyngeal swab, presence of viral mutation(s) within the areas targeted by this assay, and inadequate number of viral copies(<138 copies/mL). A negative result must be combined with clinical observations, patient history, and epidemiological information. The expected result is Negative.  Fact Sheet for Patients:  BloggerCourse.com  Fact Sheet for Healthcare Providers:  SeriousBroker.it  This test is no t yet approved or cleared by the [place] and  has been authorized for detection and/or diagnosis of SARS-CoV-2 by FDA under an Emergency Use Authorization (EUA). This EUA will remain  in effect (meaning this test can be used) for the duration of the COVID-19 declaration under Section 564(b)(1) of the Act, 21 U.S.C.section 360bbb-3(b)(1), unless the authorization is terminated  or revoked sooner.       Influenza A by PCR NEGATIVE NEGATIVE Final   Influenza B by PCR NEGATIVE NEGATIVE Final    Comment: (NOTE) The Xpert Xpress SARS-CoV-2/FLU/RSV plus assay is intended as an aid in the diagnosis of influenza from Nasopharyngeal swab specimens and should not be used as a sole basis for treatment. Nasal washings and aspirates are unacceptable for Xpert Xpress SARS-CoV-2/FLU/RSV testing.  Fact Sheet for Patients: BloggerCourse.com  Fact Sheet for Healthcare Providers: SeriousBroker.it  This test is not yet approved or cleared by the [place] FDA and has been authorized for detection and/or diagnosis of SARS-CoV-2 by FDA under an Emergency Use Authorization (EUA). This EUA will remain in effect (meaning this test can be used) for the  duration of the COVID-19 declaration under Section 564(b)(1) of the Act, 21 U.S.C. section 360bbb-3(b)(1), unless the authorization is terminated or revoked.  Performed at [facility], [address] [place], [place] [address]   Culture, blood (routine x 2)     Status: None (Preliminary result)   Collection Time: 10/01/22  2:41 PM   Specimen: BLOOD LEFT HAND  Result Value Ref Range Status   Specimen Description BLOOD LEFT HAND  Final   Special Requests   Final    BOTTLES DRAWN AEROBIC AND ANAEROBIC Blood Culture results may not be optimal due to an inadequate volume of blood received in culture bottles   Culture   Final    NO GROWTH < 24 HOURS Performed at [facility], [address], [place], [place] [address]    Report Status PENDING  Incomplete  Urine Culture     Status: Abnormal (Preliminary result)   Collection Time: 10/01/22  4:20 PM   Specimen: Urine, Catheterized  Result Value Ref Range Status   Specimen Description URINE, CATHETERIZED  Final   Special Requests   Final    NONE Performed at [facility], [address], [place], [place] [address]    Culture >=100,000 COLONIES/mL GRAM NEGATIVE RODS (A)  Final   Report Status PENDING  Incomplete         Radiology Studies: MR BRAIN WO CONTRAST  Result Date: 10/01/2022 CLINICAL DATA:  New onset seizure EXAM: MRI HEAD WITHOUT CONTRAST TECHNIQUE: Multiplanar, multiecho pulse sequences of the brain and surrounding structures were obtained without intravenous contrast. COMPARISON:  None Available. FINDINGS: Brain: No acute infarct, mass effect or extra-axial collection. No acute or chronic hemorrhage. There is multifocal hyperintense T2-weighted signal within the white matter. Parenchymal volume and CSF spaces are normal. The midline structures are normal. Vascular: Major flow voids are preserved. Skull and upper cervical spine: Normal calvarium and skull base. Visualized upper cervical spine and soft  tissues are normal. Sinuses/Orbits:No paranasal sinus fluid levels or advanced mucosal thickening. No mastoid or middle ear effusion. Normal orbits. IMPRESSION: 1. No acute intracranial abnormality. 2. Findings of chronic small vessel ischemia. Electronically Signed   By: Deatra Robinson M.D.   On: 10/01/2022 23:48   EEG adult  Result Date: 10/01/2022 Charlsie Quest, MD     10/01/2022  9:35 PM Patient Name: Destenie Ingber MRN: [id number] Epilepsy Attending: Charlsie Quest  Referring Physician/Provider: Gordy Councilman, MD Date: 10/01/2022 Duration: 25.13 mins Patient history: 81yo F with ams. EEG to evaluate for seizure Level of alertness: Awake AEDs during EEG study: None Technical aspects: This EEG study was done with scalp electrodes positioned according to the 10-20 International system of electrode placement. Electrical activity was reviewed with band pass filter of 1-70Hz , sensitivity of 7 uV/mm, display speed of 33mm/sec with a 60Hz  notched filter applied as appropriate. EEG data were recorded continuously and digitally stored.  Video monitoring was available and reviewed as appropriate. Description: No clear posterior dominant rhythm was seen. EEG showed continuous generalized 5 to 6 Hz theta admixed with intermittent generalized 2-3Hz  delta slowing. Hyperventilation and photic stimulation were not performed.   ABNORMALITY - Continuous slow, generalized IMPRESSION: This study is suggestive of moderate diffuse encephalopathy, nonspecific etiology. No seizures or epileptiform discharges were seen throughout the recording.   CT Head Wo Contrast  Result Date: 10/01/2022 CLINICAL DATA:  Mental status change. EXAM: CT HEAD WITHOUT CONTRAST TECHNIQUE: Contiguous axial images were obtained from the base of the skull through the vertex without intravenous contrast. RADIATION DOSE REDUCTION: This exam was performed according to the departmental dose-optimization program which includes  automated exposure control, adjustment of the mA and/or kV according to patient size and/or use of iterative reconstruction technique. COMPARISON:  CT examination dated August 26, 2022 FINDINGS: Brain: No evidence of acute infarction, hemorrhage, hydrocephalus, extra-axial collection or mass lesion/mass effect. Mild cerebral atrophy and chronic microvascular ischemic changes of the periventricular white matter, unchanged. Vascular: No hyperdense vessel or unexpected calcification. Skull: Normal. Negative for fracture or focal lesion. Sinuses/Orbits: No acute finding. Other: None. IMPRESSION: 1. No acute intracranial abnormality. 2. Mild cerebral atrophy and chronic microvascular ischemic changes of the periventricular white matter, unchanged. Electronically Signed   By: August 28, 2022 D.O.   On: 10/01/2022 15:50   DG Hip Unilat W or Wo Pelvis 2-3 Views Right  Result Date: 10/01/2022 CLINICAL DATA:  Pain status post fall. EXAM: DG HIP (WITH OR WITHOUT PELVIS) 2-3V RIGHT COMPARISON:  MRI pelvis and left hip 08/26/2022, pelvis and left hip radiographs 08/26/2022 FINDINGS: Mildly decreased bone mineralization. Mild-to-moderate bilateral femoroacetabular joint space narrowing. Moderate to severe right and moderate left femoral head-neck junction degenerative osteophytes. Anterior superior right femoroacetabular loose bodies measuring up to approximately 2.5 cm, similar to prior MRI and radiographs. No acute fracture is seen.  No dislocation. Vascular phleboliths overlie the pelvis. Mild atherosclerotic calcifications. IMPRESSION: 1. No acute fracture. 2. Moderate to severe right and moderate left femoroacetabular osteoarthritis. Electronically Signed   By: 10/26/2022 M.D.   On: 10/01/2022 15:27   DG Foot Complete Left  Result Date: 10/01/2022 CLINICAL DATA:  Pain status post fall. EXAM: LEFT FOOT - COMPLETE 3+ VIEW COMPARISON:  Left ankle radiographs 08/26/2022, left foot radiographs 03/29/2022 FINDINGS:  Mild-to-moderate hallux valgus, unchanged. Mild lateral greater than medial great toe metatarsophalangeal joint space narrowing and peripheral osteophytosis. Tiny plantar and posterior calcaneal heel spurs. No acute fracture or dislocation. There is diffuse decreased bone mineralization. Resolution of the prior diffuse ankle and foot soft tissue swelling. IMPRESSION: Mild-to-moderate hallux valgus and mild great toe metatarsophalangeal joint osteoarthritis, unchanged. Resolution of the prior diffuse soft tissue swelling. Electronically Signed   By: 03/31/2022 M.D.   On: 10/01/2022 15:24   DG Chest Port 1 View  Result Date: 10/01/2022 CLINICAL DATA:  Fever EXAM: PORTABLE CHEST 1 VIEW COMPARISON:  08/26/2022 FINDINGS: Cardiac size is within  normal limits. There are no signs of pulmonary edema or focal pulmonary consolidation. There is no pleural effusion or pneumothorax. There is asymmetric pleural density in the left apex with no significant change. Surgical staples are seen in the left apex. There is small calcification between acromion and right humeral head suggesting possible calcific tendinosis. Degenerative changes are noted in both AC joints, more so on the left side. IMPRESSION: There are no signs of pulmonary edema or focal pulmonary consolidation. Other findings as described in the body of the report. Electronically Signed   By: Ernie AvenaPalani  Rathinasamy M.D.   On: 10/01/2022 13:18        Scheduled Meds:  amLODipine  5 mg Oral Daily   antiseptic oral rinse  15 mL Topical BID   cholecalciferol  1,000 Units Oral Daily   donepezil  10 mg Oral QHS   labetalol  100 mg Oral BID   memantine  10 mg Oral BID   mirtazapine  15 mg Oral QHS   traMADol-acetaminophen  1 tablet Oral TID   Continuous Infusions:  cefTRIAXone (ROCEPHIN)  IV Stopped (10/02/22 1239)     LOS: 0 days      Huey Bienenstockawood Nixxon Faria, MD [facility]   To contact the attending provider between 7A-7P or the covering  provider during after hours 7P-7A, please log into the web site [email or web address] and access using universal [place] password for that web site. If you do not have the password, please call the hospital operator.  10/02/2022, 3:32 PM

## 2022-10-02 NOTE — ED Notes (Signed)
ED TO INPATIENT HANDOFF REPORT  ED Nurse Name and Phone #: Delice Bison, RN  S Name/Age/Gender Christina Perkins 81 y.o. female Room/Bed: 013C/013C  Code Status   Code Status: DNR  Home/SNF/Other Home Patient oriented to: self Is this baseline? Yes   Triage Complete: Triage complete  Chief Complaint Seizure [facility]) [R56.9]  Triage Note Pt BIB [facility] from home c/o seizure like activity that lasted for 6 minutes. Pt had tonic clonic movement in the upper body. Pt does not have a hx of seizure that the family is aware of. Family lives with pt but was not at home but a home health aid was there. Pt is bed bound. Pt has a 22g in the left hand and received 750 LR with EMS. Pt has hx of dementia and is alert and oriented x2.    Allergies No Known Allergies  Level of Care/Admitting Diagnosis ED Disposition     ED Disposition  Admit   Condition  --   Comment  Hospital Area: [facility] [[id number]]  Level of Care: Telemetry Medical [[id number]]  May place patient in observation at [facility] if equivalent level of care is available:: No  Covid Evaluation: Asymptomatic - no recent exposure (last 10 days) testing not required  Diagnosis: Seizure [facility]) [[id number]]  Admitting Physician: Anselm Jungling [[id number]]  Attending Physician: Anselm Jungling [[id number]]          B Medical/Surgery History Past Medical History:  Diagnosis Date   Abnormal chest x-ray    Abnormal results of liver function studies    Abnormal thyroid screen (blood)    Acute sinusitis    Alcohol screening    Anemia, deficiency    Arthropathy    BMI 22.0-22.9, adult    Carotid stenosis, bilateral    Carotid stenosis, bilateral    Cellulitis    Chronic kidney disease, stage III (moderate) (HCC)    Closed fracture of sacrum and coccyx, with routine healing, subsequent encounter    Colon cancer screening    Colon polyp    Dementia (HCC)    Dizzy spells    E. coli UTI 08/14/2022   Encounter for  screening mammogram for breast cancer    Facet arthropathy, lumbar    Fatty pancreas    Generalized abdominal or pelvic swelling or mass or lump    Glaucoma screening    H/O mammogram 2019   Per [facility] new patient packet   Hepatomegalia    High risk medication use    Hospital discharge follow-up    Hx of colonoscopy 2016   Per [facility] new patient packet   Hypertension    Hypertriglyceridemia    Influenza vaccination declined by patient    Low back pain    Lumbar disc disease with radiculopathy    Lung nodule    Magnesium deficiency    Medical non-compliance    Menopause    Microscopic hematuria    Mixed dyslipidemia    Neurodegenerative cognitive impairment (HCC)    Osteoarthritis, hip, bilateral    Overweight    Personal history of smoking    Plantar fasciitis    Poor appetite    Refusal of influenza vaccine by provider    Renal cyst, left    Right atrial enlargement    Right hip pain    Sciatica of right side    Screening for depression    Screening for HIV (human immunodeficiency virus)    Screening for STDs (sexually transmitted  diseases)    Senile dementia without behavioral disturbance (HCC)    Shortness of breath    Spondylolisthesis, grade 1    Unintentional weight loss    URI, acute    Urinary frequency    UTI (urinary tract infection), bacterial    Vitamin D deficiency    No past surgical history on file.   A IV Location/Drains/Wounds Patient Lines/Drains/Airways Status     Active Line/Drains/Airways     Name Placement date Placement time Site Days   Peripheral IV 10/01/22 22 G Left;Posterior Hand 10/01/22  1318  Hand  1   Peripheral IV 10/01/22 22 G Posterior;Right Hand 10/01/22  1442  Hand  1            Intake/Output Last 24 hours  Intake/Output Summary (Last 24 hours) at 10/02/2022 1228 Last data filed at 10/01/2022 1810 Gross per 24 hour  Intake 503.05 ml  Output --  Net 503.05 ml    Labs/Imaging Results for orders placed or performed  during the hospital encounter of 10/01/22 (from the past 48 hour(s))  Resp Panel by RT-PCR (Flu A&B, Covid) Anterior Nasal Swab     Status: None   Collection Time: 10/01/22  1:04 PM   Specimen: Anterior Nasal Swab  Result Value Ref Range   SARS Coronavirus 2 by RT PCR NEGATIVE NEGATIVE    Comment: (NOTE) SARS-CoV-2 target nucleic acids are NOT DETECTED.  The SARS-CoV-2 RNA is generally detectable in upper respiratory specimens during the acute phase of infection. The lowest concentration of SARS-CoV-2 viral copies this assay can detect is 138 copies/mL. A negative result does not preclude SARS-Cov-2 infection and should not be used as the sole basis for treatment or other patient management decisions. A negative result may occur with  improper specimen collection/handling, submission of specimen other than nasopharyngeal swab, presence of viral mutation(s) within the areas targeted by this assay, and inadequate number of viral copies(<138 copies/mL). A negative result must be combined with clinical observations, patient history, and epidemiological information. The expected result is Negative.  Fact Sheet for Patients:  BloggerCourse.com  Fact Sheet for Healthcare Providers:  SeriousBroker.it  This test is no t yet approved or cleared by the [place] FDA and  has been authorized for detection and/or diagnosis of SARS-CoV-2 by FDA under an Emergency Use Authorization (EUA). This EUA will remain  in effect (meaning this test can be used) for the duration of the COVID-19 declaration under Section 564(b)(1) of the Act, 21 U.S.C.section 360bbb-3(b)(1), unless the authorization is terminated  or revoked sooner.       Influenza A by PCR NEGATIVE NEGATIVE   Influenza B by PCR NEGATIVE NEGATIVE    Comment: (NOTE) The Xpert Xpress SARS-CoV-2/FLU/RSV plus assay is intended as an aid in the diagnosis of influenza from Nasopharyngeal  swab specimens and should not be used as a sole basis for treatment. Nasal washings and aspirates are unacceptable for Xpert Xpress SARS-CoV-2/FLU/RSV testing.  Fact Sheet for Patients: BloggerCourse.com  Fact Sheet for Healthcare Providers: SeriousBroker.it  This test is not yet approved or cleared by the [place] FDA and has been authorized for detection and/or diagnosis of SARS-CoV-2 by FDA under an Emergency Use Authorization (EUA). This EUA will remain in effect (meaning this test can be used) for the duration of the COVID-19 declaration under Section 564(b)(1) of the Act, 21 U.S.C. section 360bbb-3(b)(1), unless the authorization is terminated or revoked.  Performed at [facility], [address] [place],  [place] [address]   CBC with Differential     Status: None   Collection Time: 10/01/22  2:41 PM  Result Value Ref Range   WBC 7.3 4.0 - 10.5 K/uL   RBC 4.23 3.87 - 5.11 MIL/uL   Hemoglobin 12.9 12.0 - 15.0 g/dL   HCT 60.4 54.0 - 98.1 %   MCV 92.2 80.0 - 100.0 fL   MCH 30.5 26.0 - 34.0 pg   MCHC 33.1 30.0 - 36.0 g/dL   RDW 19.1 47.8 - 29.5 %   Platelets 256 150 - 400 K/uL   nRBC 0.0 0.0 - 0.2 %   Neutrophils Relative % 65 %   Neutro Abs 4.7 1.7 - 7.7 K/uL   Lymphocytes Relative 25 %   Lymphs Abs 1.8 0.7 - 4.0 K/uL   Monocytes Relative 7 %   Monocytes Absolute 0.5 0.1 - 1.0 K/uL   Eosinophils Relative 2 %   Eosinophils Absolute 0.2 0.0 - 0.5 K/uL   Basophils Relative 0 %   Basophils Absolute 0.0 0.0 - 0.1 K/uL   Immature Granulocytes 1 %   Abs Immature Granulocytes 0.05 0.00 - 0.07 K/uL    Comment: Performed at [facility], [address], [place], [place] [address]  Protime-INR     Status: None   Collection Time: 10/01/22  2:41 PM  Result Value Ref Range   Prothrombin Time 13.8 11.4 - 15.2 seconds   INR 1.1 0.8 - 1.2    Comment: (NOTE) INR goal varies based on device and disease  states. Performed at [facility], [address] [place], [place] [address]   Lactic acid, plasma     Status: None   Collection Time: 10/01/22  2:41 PM  Result Value Ref Range   Lactic Acid, Venous 1.8 0.5 - 1.9 mmol/L    Comment: Performed at [facility], [address], [place], [place] [address]  Culture, blood (routine x 2)     Status: None (Preliminary result)   Collection Time: 10/01/22  2:41 PM   Specimen: BLOOD LEFT HAND  Result Value Ref Range   Specimen Description BLOOD LEFT HAND    Special Requests      BOTTLES DRAWN AEROBIC AND ANAEROBIC Blood Culture results may not be optimal due to an inadequate volume of blood received in culture bottles   Culture      NO GROWTH < 24 HOURS Performed at [facility], [address], [place], [place] [address]    Report Status PENDING   I-stat chem 8, ED     Status: None   Collection Time: 10/01/22  2:56 PM  Result Value Ref Range   Sodium 141 135 - 145 mmol/L   Potassium 3.7 3.5 - 5.1 mmol/L   Chloride 102 98 - 111 mmol/L   BUN 17 8 - 23 mg/dL   Creatinine, Ser 1.32 0.44 - 1.00 mg/dL   Glucose, Bld 83 70 - 99 mg/dL    Comment: Glucose reference range applies only to samples taken after fasting for at least 8 hours.   Calcium, Ion 1.23 1.15 - 1.40 mmol/L   TCO2 26 22 - 32 mmol/L   Hemoglobin 15.0 12.0 - 15.0 g/dL   HCT 44.0 10.2 - 72.5 %  Urinalysis, Routine w reflex microscopic     Status: Abnormal   Collection Time: 10/01/22  4:25 PM  Result Value Ref Range   Color, Urine YELLOW YELLOW   APPearance HAZY (A) CLEAR  Specific Gravity, Urine 1.016 1.005 - 1.030   pH 5.0 5.0 - 8.0   Glucose, UA NEGATIVE NEGATIVE mg/dL   Hgb urine dipstick SMALL (A) NEGATIVE   Bilirubin Urine NEGATIVE NEGATIVE   Ketones, ur 5 (A) NEGATIVE mg/dL   Protein, ur 30 (A) NEGATIVE mg/dL   Nitrite POSITIVE (A) NEGATIVE   Leukocytes,Ua MODERATE (A) NEGATIVE   RBC / HPF 0-5 0 - 5 RBC/hpf   WBC, UA >50 (H) 0 - 5 WBC/hpf    Bacteria, UA MANY (A) NONE SEEN   Squamous Epithelial / LPF 0-5 0 - 5   WBC Clumps PRESENT     Comment: Performed at [facility], [address], [place], [place] [address]  Urine rapid drug screen (hosp performed)     Status: None   Collection Time: 10/01/22  4:25 PM  Result Value Ref Range   Opiates NONE DETECTED NONE DETECTED   Cocaine NONE DETECTED NONE DETECTED   Benzodiazepines NONE DETECTED NONE DETECTED   Amphetamines NONE DETECTED NONE DETECTED   Tetrahydrocannabinol NONE DETECTED NONE DETECTED   Barbiturates NONE DETECTED NONE DETECTED    Comment: (NOTE) DRUG SCREEN FOR MEDICAL PURPOSES ONLY.  IF CONFIRMATION IS NEEDED FOR ANY PURPOSE, NOTIFY LAB WITHIN 5 DAYS.  LOWEST DETECTABLE LIMITS FOR URINE DRUG SCREEN Drug Class                     Cutoff (ng/mL) Amphetamine and metabolites    1000 Barbiturate and metabolites    200 Benzodiazepine                 200 Opiates and metabolites        300 Cocaine and metabolites        300 THC                            50 Performed at [facility], [address] [place], [place] [address]   CBG monitoring, ED     Status: None   Collection Time: 10/01/22  6:33 PM  Result Value Ref Range   Glucose-Capillary 79 70 - 99 mg/dL    Comment: Glucose reference range applies only to samples taken after fasting for at least 8 hours.  Comprehensive metabolic panel     Status: Abnormal   Collection Time: 10/01/22  7:27 PM  Result Value Ref Range   Sodium 142 135 - 145 mmol/L   Potassium 3.1 (L) 3.5 - 5.1 mmol/L   Chloride 110 98 - 111 mmol/L   CO2 20 (L) 22 - 32 mmol/L   Glucose, Bld 76 70 - 99 mg/dL    Comment: Glucose reference range applies only to samples taken after fasting for at least 8 hours.   BUN 14 8 - 23 mg/dL   Creatinine, Ser 4.70 0.44 - 1.00 mg/dL   Calcium 7.7 (L) 8.9 - 10.3 mg/dL   Total Protein 5.0 (L) 6.5 - 8.1 g/dL   Albumin 2.4 (L) 3.5 - 5.0 g/dL   AST 15 15 - 41 U/L   ALT 7 0 - 44 U/L    Alkaline Phosphatase 59 38 - 126 U/L   Total Bilirubin 0.8 0.3 - 1.2 mg/dL   GFR, Estimated >96 >28 mL/min    Comment: (NOTE) Calculated using the CKD-EPI Creatinine Equation (2021)    Anion gap 12 5 - 15    Comment: Performed at [facility], [address] [place], [place] [address]  Troponin I (High Sensitivity)     Status: None   Collection Time: 10/01/22  7:27 PM  Result Value Ref Range   Troponin I (High Sensitivity) 6 <18 ng/L    Comment: (NOTE) Elevated high sensitivity troponin I (hsTnI) values and significant  changes across serial measurements may suggest ACS but many other  chronic and acute conditions are known to elevate hsTnI results.  Refer to the "Links" section for chest pain algorithms and additional  guidance. Performed at [facility], [address] [place], [place] [address]   Magnesium     Status: Abnormal   Collection Time: 10/01/22  8:10 PM  Result Value Ref Range   Magnesium 1.5 (L) 1.7 - 2.4 mg/dL    Comment: Performed at [facility], [address] [place], [place] [address]  CK     Status: None   Collection Time: 10/01/22  9:32 PM  Result Value Ref Range   Total CK 72 38 - 234 U/L    Comment: Performed at [facility], [address] [place], [place] [address]  Basic metabolic panel     Status: Abnormal   Collection Time: 10/02/22  1:35 AM  Result Value Ref Range   Sodium 142 135 - 145 mmol/L   Potassium 3.5 3.5 - 5.1 mmol/L   Chloride 109 98 - 111 mmol/L   CO2 22 22 - 32 mmol/L   Glucose, Bld 96 70 - 99 mg/dL    Comment: Glucose reference range applies only to samples taken after fasting for at least 8 hours.   BUN 14 8 - 23 mg/dL   Creatinine, Ser 6.57 0.44 - 1.00 mg/dL   Calcium 8.7 (L) 8.9 - 10.3 mg/dL   GFR, Estimated >84 >69 mL/min    Comment: (NOTE) Calculated using the CKD-EPI Creatinine Equation (2021)    Anion gap 11 5 - 15    Comment: Performed at [facility], [address] [place],  [place] [address]   MR BRAIN WO CONTRAST  Result Date: 10/01/2022 CLINICAL DATA:  New onset seizure EXAM: MRI HEAD WITHOUT CONTRAST TECHNIQUE: Multiplanar, multiecho pulse sequences of the brain and surrounding structures were obtained without intravenous contrast. COMPARISON:  None Available. FINDINGS: Brain: No acute infarct, mass effect or extra-axial collection. No acute or chronic hemorrhage. There is multifocal hyperintense T2-weighted signal within the white matter. Parenchymal volume and CSF spaces are normal. The midline structures are normal. Vascular: Major flow voids are preserved. Skull and upper cervical spine: Normal calvarium and skull base. Visualized upper cervical spine and soft tissues are normal. Sinuses/Orbits:No paranasal sinus fluid levels or advanced mucosal thickening. No mastoid or middle ear effusion. Normal orbits. IMPRESSION: 1. No acute intracranial abnormality. 2. Findings of chronic small vessel ischemia. Electronically Signed   By: Deatra Robinson M.D.   On: 10/01/2022 23:48   EEG adult  Result Date: 10/01/2022 Charlsie Quest, MD     10/01/2022  9:35 PM Patient Name: Christina Perkins MRN: [id number] Epilepsy Attending: Charlsie Quest Referring Physician/Provider: Gordy Councilman, MD Date: 10/01/2022 Duration: 25.13 mins Patient history: 81yo F with ams. EEG to evaluate for seizure Level of alertness: Awake AEDs during EEG study: None Technical aspects: This EEG study was done with scalp electrodes positioned according to the 10-20 International system of electrode placement. Electrical activity was reviewed with band pass filter of 1-70Hz , sensitivity of 7 uV/mm, display speed of 16mm/sec with  a  notched filter applied as appropriate. EEG data were recorded continuously and digitally stored.  Video monitoring was available and reviewed as appropriate. Description: No clear posterior dominant rhythm was seen. EEG showed continuous generalized 5 to 6 Hz theta admixed with  intermittent generalized 2-3Hz  delta slowing. Hyperventilation and photic stimulation were not performed.   ABNORMALITY - Continuous slow, generalized IMPRESSION: This study is suggestive of moderate diffuse encephalopathy, nonspecific etiology. No seizures or epileptiform discharges were seen throughout the recording. Charlsie Quest   CT Head Wo Contrast  Result Date: 10/01/2022 CLINICAL DATA:  Mental status change. EXAM: CT HEAD WITHOUT CONTRAST TECHNIQUE: Contiguous axial images were obtained from the base of the skull through the vertex without intravenous contrast. RADIATION DOSE REDUCTION: This exam was performed according to the departmental dose-optimization program which includes automated exposure control, adjustment of the mA and/or kV according to patient size and/or use of iterative reconstruction technique. COMPARISON:  CT examination dated August 26, 2022 FINDINGS: Brain: No evidence of acute infarction, hemorrhage, hydrocephalus, extra-axial collection or mass lesion/mass effect. Mild cerebral atrophy and chronic microvascular ischemic changes of the periventricular white matter, unchanged. Vascular: No hyperdense vessel or unexpected calcification. Skull: Normal. Negative for fracture or focal lesion. Sinuses/Orbits: No acute finding. Other: None. IMPRESSION: 1. No acute intracranial abnormality. 2. Mild cerebral atrophy and chronic microvascular ischemic changes of the periventricular white matter, unchanged. Electronically Signed   By: Larose Hires D.O.   On: 10/01/2022 15:50   DG Hip Unilat W or Wo Pelvis 2-3 Views Right  Result Date: 10/01/2022 CLINICAL DATA:  Pain status post fall. EXAM: DG HIP (WITH OR WITHOUT PELVIS) 2-3V RIGHT COMPARISON:  MRI pelvis and left hip 08/26/2022, pelvis and left hip radiographs 08/26/2022 FINDINGS: Mildly decreased bone mineralization. Mild-to-moderate bilateral femoroacetabular joint space narrowing. Moderate to severe right and moderate left  femoral head-neck junction degenerative osteophytes. Anterior superior right femoroacetabular loose bodies measuring up to approximately 2.5 cm, similar to prior MRI and radiographs. No acute fracture is seen.  No dislocation. Vascular phleboliths overlie the pelvis. Mild atherosclerotic calcifications. IMPRESSION: 1. No acute fracture. 2. Moderate to severe right and moderate left femoroacetabular osteoarthritis. Electronically Signed   By: Neita Garnet M.D.   On: 10/01/2022 15:27   DG Foot Complete Left  Result Date: 10/01/2022 CLINICAL DATA:  Pain status post fall. EXAM: LEFT FOOT - COMPLETE 3+ VIEW COMPARISON:  Left ankle radiographs 08/26/2022, left foot radiographs 03/29/2022 FINDINGS: Mild-to-moderate hallux valgus, unchanged. Mild lateral greater than medial great toe metatarsophalangeal joint space narrowing and peripheral osteophytosis. Tiny plantar and posterior calcaneal heel spurs. No acute fracture or dislocation. There is diffuse decreased bone mineralization. Resolution of the prior diffuse ankle and foot soft tissue swelling. IMPRESSION: Mild-to-moderate hallux valgus and mild great toe metatarsophalangeal joint osteoarthritis, unchanged. Resolution of the prior diffuse soft tissue swelling. Electronically Signed   By: Neita Garnet M.D.   On: 10/01/2022 15:24   DG Chest Port 1 View  Result Date: 10/01/2022 CLINICAL DATA:  Fever EXAM: PORTABLE CHEST 1 VIEW COMPARISON:  08/26/2022 FINDINGS: Cardiac size is within normal limits. There are no signs of pulmonary edema or focal pulmonary consolidation. There is no pleural effusion or pneumothorax. There is asymmetric pleural density in the left apex with no significant change. Surgical staples are seen in the left apex. There is small calcification between acromion and right humeral head suggesting possible calcific tendinosis. Degenerative changes are noted in both AC joints, more so on the left side. IMPRESSION:  There are no signs of  pulmonary edema or focal pulmonary consolidation. Other findings as described in the body of the report. Electronically Signed   By: Ernie AvenaPalani  Rathinasamy M.D.   On: 10/01/2022 13:18    Pending Labs Unresulted Labs (From admission, onward)     Start     Ordered   10/01/22 1620  Urine Culture  Once,   URGENT       Question:  Indication  Answer:  Dysuria   10/01/22 1619   10/01/22 1304  Culture, blood (routine x 2)  BLOOD CULTURE X 2,   R (with STAT occurrences)      10/01/22 1304            Vitals/Pain Today's Vitals   10/02/22 0730 10/02/22 0733 10/02/22 1030 10/02/22 1159  BP: 122/72  (!) 115/58   Pulse: 79  80   Resp: 15  20   Temp:  97.6 F (36.4 C)  97.6 F (36.4 C)  TempSrc:  Oral  Oral  SpO2: 100%  99%   Weight:      Height:      PainSc:        Isolation Precautions No active isolations  Medications Medications  enoxaparin (LOVENOX) injection 40 mg (40 mg Subcutaneous Given 10/01/22 2216)  cefTRIAXone (ROCEPHIN) 1 g in sodium chloride 0.9 % 100 mL IVPB (1 g Intravenous New Bag/Given 10/02/22 1210)  0.9 %  sodium chloride infusion (0 mLs Intravenous Stopped 10/02/22 0929)  lip balm (CARMEX) ointment (75 Applications Topical Given 10/01/22 2238)  acetaminophen (TYLENOL) tablet 650 mg (650 mg Oral Given 10/01/22 2249)  atorvastatin (LIPITOR) tablet 10 mg (10 mg Oral Given 10/01/22 2239)  labetalol (NORMODYNE) tablet 100 mg (100 mg Oral Given 10/02/22 0927)  donepezil (ARICEPT) tablet 10 mg (10 mg Oral Given 10/01/22 2239)  memantine (NAMENDA) tablet 10 mg (10 mg Oral Given 10/02/22 0927)  mirtazapine (REMERON) tablet 15 mg (15 mg Oral Given 10/01/22 2239)  cholecalciferol (VITAMIN D3) 25 MCG (1000 UNIT) tablet 1,000 Units (1,000 Units Oral Given 10/02/22 0927)  amLODipine (NORVASC) tablet 5 mg (5 mg Oral Given 10/02/22 0927)  sodium chloride 0.9 % bolus 500 mL ( Intravenous Stopped 10/01/22 1717)  0.9 %  sodium chloride infusion (0 mLs Intravenous Stopped 10/01/22  2200)  acetaminophen (TYLENOL) tablet 1,000 mg (1,000 mg Oral Given 10/01/22 1809)  cefTRIAXone (ROCEPHIN) 1 g in sodium chloride 0.9 % 100 mL IVPB (0 g Intravenous Stopped 10/01/22 1948)  fentaNYL (SUBLIMAZE) injection 12.5 mcg (12.5 mcg Intravenous Given 10/01/22 2029)  potassium chloride 10 mEq in 100 mL IVPB (0 mEq Intravenous Stopped 10/02/22 0055)  calcium carbonate (OS-CAL - dosed in mg of elemental calcium) tablet 1,250 mg (1,250 mg Oral Given 10/01/22 2239)    Mobility walks with device High fall risk   Focused Assessments Cardiac Assessment Handoff:    Lab Results  Component Value Date   CKTOTAL 72 10/01/2022   No results found for: "DDIMER" Does the Patient currently have chest pain? No    R Recommendations: See Admitting Provider Note  Report given to:   Additional Notes:

## 2022-10-03 ENCOUNTER — Other Ambulatory Visit (HOSPITAL_COMMUNITY): Payer: Self-pay

## 2022-10-03 DIAGNOSIS — Z515 Encounter for palliative care: Secondary | ICD-10-CM

## 2022-10-03 DIAGNOSIS — Z743 Need for continuous supervision: Secondary | ICD-10-CM | POA: Diagnosis not present

## 2022-10-03 DIAGNOSIS — N390 Urinary tract infection, site not specified: Secondary | ICD-10-CM | POA: Diagnosis not present

## 2022-10-03 DIAGNOSIS — Z66 Do not resuscitate: Secondary | ICD-10-CM | POA: Diagnosis not present

## 2022-10-03 DIAGNOSIS — R52 Pain, unspecified: Secondary | ICD-10-CM

## 2022-10-03 DIAGNOSIS — Z7189 Other specified counseling: Secondary | ICD-10-CM | POA: Diagnosis not present

## 2022-10-03 DIAGNOSIS — N1832 Chronic kidney disease, stage 3b: Secondary | ICD-10-CM | POA: Diagnosis not present

## 2022-10-03 DIAGNOSIS — R531 Weakness: Secondary | ICD-10-CM | POA: Diagnosis not present

## 2022-10-03 DIAGNOSIS — G301 Alzheimer's disease with late onset: Secondary | ICD-10-CM | POA: Diagnosis not present

## 2022-10-03 DIAGNOSIS — I10 Essential (primary) hypertension: Secondary | ICD-10-CM | POA: Diagnosis not present

## 2022-10-03 DIAGNOSIS — R410 Disorientation, unspecified: Secondary | ICD-10-CM | POA: Diagnosis not present

## 2022-10-03 DIAGNOSIS — Z7401 Bed confinement status: Secondary | ICD-10-CM | POA: Diagnosis not present

## 2022-10-03 DIAGNOSIS — R569 Unspecified convulsions: Secondary | ICD-10-CM | POA: Diagnosis not present

## 2022-10-03 LAB — URINE CULTURE: Culture: >=100000 — AB

## 2022-10-03 MED ORDER — LORAZEPAM 1 MG PO TABS
1.0000 mg | ORAL_TABLET | ORAL | 0 refills | Status: AC | PRN
  Filled 2022-10-03: qty 20, 5d supply, fill #0

## 2022-10-03 MED ORDER — HALOPERIDOL 1 MG PO TABS
2.0000 mg | ORAL_TABLET | Freq: Four times a day (QID) | ORAL | 0 refills | Status: AC | PRN
  Filled 2022-10-03: qty 40, 5d supply, fill #0

## 2022-10-03 MED ORDER — HYDROMORPHONE HCL 2 MG PO TABS
2.0000 mg | ORAL_TABLET | ORAL | 0 refills | Status: AC | PRN
  Filled 2022-10-03: qty 20, 3d supply, fill #0

## 2022-10-03 MED ORDER — ONDANSETRON HCL 4 MG PO TABS
4.0000 mg | ORAL_TABLET | Freq: Four times a day (QID) | ORAL | 0 refills | Status: AC | PRN
  Filled 2022-10-03: qty 20, 5d supply, fill #0

## 2022-10-03 MED ORDER — CEPHALEXIN 500 MG PO CAPS
500.0000 mg | ORAL_CAPSULE | Freq: Two times a day (BID) | ORAL | 0 refills | Status: AC
  Filled 2022-10-03: qty 4, 2d supply, fill #0

## 2022-10-03 MED ORDER — TRAMADOL-ACETAMINOPHEN 37.5-325 MG PO TABS
1.0000 | ORAL_TABLET | Freq: Three times a day (TID) | ORAL | 0 refills | Status: AC
  Filled 2022-10-03: qty 20, 7d supply, fill #0

## 2022-10-03 NOTE — TOC Transition Note (Signed)
Transition of Care [facility]) - CM/SW Discharge Note   Patient Details  Name: Christina Perkins MRN: [id number] Date of Birth: 1941-10-20  [facility] of [facility] CM/SW Contact:  Kermit Balo, RN Phone Number: 10/03/2022, 11:50 AM   Clinical Narrative:    Pt is discharging home with hospice services through [facility].  Per Cheri with [facility] the DME has been delivered. CM spoke with patient's son and confirmed home address. [facility] arranged for transport home. Medications for home to be delivered to the patients room and will transport with patient home.  Bedside RN updated.   Final next level of care: Home w Hospice Care Barriers to Discharge: No Barriers Identified   Patient Goals and CMS Choice   CMS Medicare.gov Compare Post Acute Care list provided to:: Patient Represenative (must comment) Choice offered to / list presented to : Adult Children  Discharge Placement                       Discharge Plan and Services   Discharge Planning Services: CM Consult Post Acute Care Choice: Durable Medical Equipment, Hospice                               Social Determinants of Health (SDOH) Interventions     Readmission Risk Interventions    08/19/2022   12:49 PM  Readmission Risk Prevention Plan  Transportation Screening Complete  PCP or Specialist Appt within 5-7 Days Complete  Home Care Screening Complete  Medication Review (RN CM) Complete

## 2022-10-03 NOTE — Discharge Instructions (Signed)
Management per hospice service, she is on feeding for comfort.

## 2022-10-03 NOTE — Progress Notes (Signed)
Daily Progress Note   Patient Name: Christina Perkins       Date: 10/03/2022 DOB: January 14, 1941  Age: 81 y.o. MRN#: [id number] Attending Physician: Christina Arms, MD Primary Care Physician: Christina Bookman, NP Admit Date: 10/01/2022  Reason for Consultation/Follow-up: Non pain symptom management, Pain control, Psychosocial/spiritual support, and Terminal Care  Subjective: Chart review performed. Received report from primary RN - no acute concerns. RN reports patient is lethargic, not eating, drinking small sips.   Went to visit patient at bedside - no family/visitors present. Patient was lying in bed awake, alert, pleasantly confused - she is able to answer simple questions. She tells me she "feels ok." She denies pain while smiling at me and expresses happiness about being able to go home today. Patient appears frail and weak. No signs or non-verbal gestures of pain or discomfort noted. No respiratory distress, increased work of breathing, or secretions noted.   10:30 AM Called son/Christina Perkins - emotional support provided. He is at home and DME was just delivered - family are ready for patient's discharge. Reviewed patient's pain management regimen. Discharge coordination with transport reviewed per his request.   [name] expresses appreciation for PMT assistance.  All questions and concerns addressed. Encouraged to call with questions and/or concerns. PMT card previously provided.  Discussed pain management plan with primary RN to provided PRN dilaudid prior to transport.   Notified TOC DME has been delivered per son.   Length of Stay: 0  Current Medications: Scheduled Meds:   amLODipine  5 mg Oral Daily   donepezil  10 mg Oral QHS   labetalol  100 mg Oral BID   memantine  10 mg Oral  BID   mirtazapine  15 mg Oral QHS   mouth rinse  15 mL Mouth Rinse BID   traMADol-acetaminophen  1 tablet Oral TID    Continuous Infusions:  cefTRIAXone (ROCEPHIN)  IV Stopped (10/02/22 1239)    PRN Meds: acetaminophen **OR** acetaminophen, diphenhydrAMINE, glycopyrrolate **OR** glycopyrrolate **OR** glycopyrrolate, haloperidol **OR** haloperidol **OR** haloperidol lactate, HYDROmorphone, lip balm, LORazepam **OR** [DISCONTINUED] LORazepam **OR** LORazepam, ondansetron **OR** ondansetron (ZOFRAN) IV, polyvinyl alcohol  Physical Exam Vitals and nursing note reviewed.  Constitutional:      General: She is not in acute distress. Pulmonary:     Effort: No respiratory distress.  Skin:  General: Skin is warm and dry.  Neurological:     Mental Status: She is alert. Mental status is at baseline. She is disoriented and confused.     Motor: Weakness present.  Psychiatric:        Attention and Perception: Attention normal.        Behavior: Behavior is cooperative.        Cognition and Memory: Cognition is impaired. Memory is impaired.             Vital Signs: BP 132/68 (BP Location: Right Arm)   Pulse 83   Temp 98.1 F (36.7 C) (Oral)   Resp 17   Ht 5\' 7"  (1.702 m)   Wt 66.4 kg   SpO2 100%   BMI 22.93 kg/m  SpO2: SpO2: 100 % O2 Device: O2 Device: Room Air O2 Flow Rate:    Intake/output summary:  Intake/Output Summary (Last 24 hours) at 10/03/2022 1059 Last data filed at 10/03/2022 10/05/2022 Gross per 24 hour  Intake 94.82 ml  Output 500 ml  Net -405.18 ml   LBM:   Baseline Weight: Weight: 66.4 kg Most recent weight: Weight: 66.4 kg       Palliative Assessment/Data: PPS 20%      Patient Active Problem List   Diagnosis Date Noted   Hospice care patient 10/03/2022   Palliative care patient 10/03/2022   Seizure (HCC) 10/01/2022   Hypocalcemia 10/01/2022   Failure to thrive in adult 08/17/2022   Anorexia 08/17/2022   UTI (urinary tract infection) 08/14/2022   E.  coli UTI 08/14/2022   Acute cystitis without hematuria 08/13/2022   Stage 3b chronic kidney disease (CKD) (HCC) 08/13/2022   Acute bilateral low back pain without sciatica 11/08/2020   Late onset Alzheimer's dementia without behavioral disturbance (HCC) 08/28/2020   Malnutrition of moderate degree 07/19/2020   Weakness 07/18/2020   Generalized weakness 07/18/2020   Fall at home, initial encounter 07/18/2020   Hypokalemia 07/18/2020   Avascular necrosis of femoral head (HCC) 07/18/2020   Essential hypertension 07/18/2020   Injury of toe on right foot 11/07/2013   Pain in toe of right foot 11/07/2013    Palliative Care Assessment & Plan   Patient Profile: 81 y.o. female  with past medical history of dementia,stroke hypertension, CKD 3B, recurrent UTIs presented to ED on 10/01/22 from home after seizure-like activity. Neurology was consulted - head CT, MRI brain, and EEG were unremarkable. UA positive with nitrate and negative leukocytes and many bacteria. Patient was admitted on 10/01/2022 with seizure like activity suspect more likely acute metabolic encephalopathy from UTI.   Assessment: Principal Problem:   Seizure (HCC) Active Problems:   Hypokalemia   Essential hypertension   Late onset Alzheimer's dementia without behavioral disturbance (HCC)   Stage 3b chronic kidney disease (CKD) (HCC)   UTI (urinary tract infection)   Hypocalcemia   Hospice care patient   Palliative care patient   Terminal care  Recommendations/Plan: Continue full comfort measures Continue DNR/DNI as previously documented - durable DNR form completed and placed in shadow chart. Copy was made and will be scanned into Vynca/ACP tab Discharge home with hospice services today - DME has been delivered Continue current comfort focused medication regimen - no changes today PMT will continue to follow peripherally. If there are any imminent needs please call the service directly  Symptom Management Ultracet  TID  Continue antibiotic for UTI Continue norvasc, aricept, labetalol, namenda, mirtazapine while still tolerating POs Dilaudid PRN pain/dyspnea/increased work of breathing/RR>25 Tylenol  PRN pain/fever Biotin twice daily Benadryl PRN itching Robinul PRN secretions Haldol PRN agitation/delirium Ativan PRN anxiety/seizure/sleep/distress Zofran PRN nausea/vomiting Liquifilm Tears PRN dry eye  Goals of Care and Additional Recommendations: Limitations on Scope of Treatment: Full Comfort Care  Code Status:    Code Status Orders  (From admission, onward)           Start     Ordered   10/02/22 1436  Do not attempt resuscitation (DNR)  Continuous       Question Answer Comment  In the event of cardiac or respiratory ARREST Do not call a "code blue"   In the event of cardiac or respiratory ARREST Do not perform Intubation, CPR, defibrillation or ACLS   In the event of cardiac or respiratory ARREST Use medication by any route, position, wound care, and other measures to relive pain and suffering. May use oxygen, suction and manual treatment of airway obstruction as needed for comfort.      10/02/22 1442           Code Status History     Date Active Date Inactive Code Status Order ID Comments User Context   10/01/2022 2154 10/02/2022 1442 DNR [id number]  Anselm Jungling, DO ED   08/13/2022 2150 08/19/2022 1923 Full Code [id number]  Carollee Herter, DO Inpatient   07/18/2020 1053 07/20/2020 1647 Full Code [id number]  Jae Dire, MD Inpatient      Advance Directive Documentation    Flowsheet Row Most Recent Value  Type of Advance Directive Living will  Pre-existing out of facility DNR order (yellow form or pink MOST form) --  "MOST" Form in Place? --       Prognosis:  Poor  Discharge Planning: Home with Hospice  Care plan was discussed with primary RN, patient's son, TOC, hospice liaison, Dr. Randol Kern  Thank you for allowing the Palliative Medicine Team to assist in the care of  this patient.  Haskel Khan, NP  Please contact Palliative Medicine Team phone at [phone] for questions and concerns.   *Portions of this note are a verbal dictation therefore any spelling and/or grammatical errors are due to the "[employer]" system interpretation.

## 2022-10-03 NOTE — Discharge Summary (Signed)
Physician Discharge Summary  Christina PrinceKay Perkins WUJ:[id number]:[id number] DOB: 11-Dec-1940 DOA: 10/01/2022  PCP: Caesar BookmanNgetich, Dinah C, NP  Admit date: 10/01/2022 Discharge date: 10/03/2022  Admitted From: (Home) Disposition:  (Home )  Recommendations for Outpatient Follow-up:  Patient to be discharged on hospice care, management per hospice, feeding for comfort   Discharge Condition: (hospice) CODE STATUS ( DNR, Comfort Care) Diet recommendation: Feeding for comfort  Brief/Interim Summary:    Christina PrinceKay Simer is a 81 y.o. female with medical history significant of Dementia,stroke hypertension, CKD 3B who presents with seizure-like activity.  Patient with dementia, on takes care of her, he reports that patient previously could ambulate with walker but in the past several months has continued to decline with recurrent UTI and hospitalization.  She was discharged to [facility] at the end of September and received suboptimal care per son.  She had falls and was found laying in her feces on several occasions.  Patient just returned home on 09/13/2022 and has now become bedbound with contracted lower extremity.  Now has difficulty feeding herself and requires full care of ADLs from family members and caretaker.  At baseline, she is alert and oriented to self, immediate family and place. In the ED, she was afebrile, BP of 139/77 on room air.  Her CBC was unremarkable, Negative flu/COVID PCR. Negative UDS. CT head is negative.  Right hip x-ray and left foot x-ray shows only osteo arthritis. UA is grossly positive with nitrate and negative leukocytes and many bacteria.  MRI brain with no acute findings, she with no evidence of seizures, patient with significant decline over last few month, poor life quality for which palliative medicine has been consulted, and decision has been made to proceed with hospice/full comfort measures.   Failure to thrive Late onset Alzheimer's dementia without behavioral disturbance  (HCC) -Overall patient with gradual decline over the last 6054-month, currently with significant dementia, multiple comorbidities and Poor life quality -palliative medicine consult greatly appreciated, plan to proceed with full comfort measure, and to discharge home with hospice, plan to DC home today once DME is delivered.    UTI (urinary tract infection) -hx of recurrent UTI, she was treated with IV Rocephin, urine culture growing pansensitive E. coli, treated with IV Rocephin during hospital stay we will discharge on Keflex  Acute metabolic encephalopathy from UTI -She presents with seizure-like activity, but this is most likely in metabolic encephalopathy from UTI, dementia and failure to thrive -no acute findings in MRI with no acute finding, no evidence of seizures on EEG   Stage 3b chronic kidney disease (CKD) (HCC) Creatinine is stable at baseline     Essential hypertension -Normotensive on presentation -Continue home amlodipine, labetalol   Hypocalcemia Calcium of 8.6.  Repleted with oral calcium.   Hypokalemia Repleted with IV potassium    Discharge Diagnoses:  Principal Problem:   Seizure (HCC) Active Problems:   Essential hypertension   Late onset Alzheimer's dementia without behavioral disturbance (HCC)   Stage 3b chronic kidney disease (CKD) (HCC)   Hypokalemia   UTI (urinary tract infection)   Hypocalcemia   Hospice care patient   Palliative care patient    Discharge Instructions  Discharge Instructions     Discharge instructions   Complete by: As directed    Management per hospice service, she is on feeding for comfort.   Increase activity slowly   Complete by: As directed       Allergies as of 10/03/2022   No Known Allergies  Medication List     STOP taking these medications    acetaminophen 500 MG tablet Commonly known as: TYLENOL   atorvastatin 10 MG tablet Commonly known as: LIPITOR   cloNIDine 0.1 MG tablet Commonly known as:  CATAPRES       TAKE these medications    amLODipine 5 MG tablet Commonly known as: NORVASC Take 1 tablet (5 mg total) by mouth daily.   cephALEXin 500 MG capsule Commonly known as: KEFLEX Take 1 capsule (500 mg total) by mouth 2 (two) times daily for 2 days. Start taking on: October 04, 2022   cholecalciferol 25 MCG (1000 UNIT) tablet Commonly known as: VITAMIN D3 Take 1,000 Units by mouth daily.   donepezil 10 MG tablet Commonly known as: ARICEPT Take 10 mg by mouth at bedtime. Before Bedtime.   haloperidol 1 MG tablet Commonly known as: HALDOL Take 2 tablets (2 mg total) by mouth every 6 (six) hours as needed for agitation (or delirium).   HYDROmorphone 2 MG tablet Commonly known as: DILAUDID Take 1 tablet (2 mg total) by mouth every 2 (two) hours as needed for severe pain (dyspnea, increased work of breathing, RR >25, distress).   labetalol 100 MG tablet Commonly known as: NORMODYNE Take 1 tablet (100 mg total) by mouth 2 (two) times daily.   LORazepam 1 MG tablet Commonly known as: ATIVAN Take 1 tablet (1 mg total) by mouth every hour as needed for anxiety, seizure or sleep (resp distress, anxiety).   Melatonin 10 MG Tabs Take 10 mg by mouth at bedtime as needed.   memantine 10 MG tablet Commonly known as: NAMENDA Take 10 mg by mouth 2 (two) times daily.   mirtazapine 15 MG tablet Commonly known as: REMERON Take 1 tablet (15 mg total) by mouth at bedtime.   ondansetron 4 MG tablet Commonly known as: ZOFRAN Take 1 tablet (4 mg total) by mouth every 6 (six) hours as needed for nausea.   traMADol-acetaminophen 37.5-325 MG tablet Commonly known as: ULTRACET Take 1 tablet by mouth 3 (three) times daily.        No Known Allergies  Consultations: Palliaitve medicine   Procedures/Studies: MR BRAIN WO CONTRAST  Result Date: 10/01/2022 CLINICAL DATA:  New onset seizure EXAM: MRI HEAD WITHOUT CONTRAST TECHNIQUE: Multiplanar, multiecho pulse sequences  of the brain and surrounding structures were obtained without intravenous contrast. COMPARISON:  None Available. FINDINGS: Brain: No acute infarct, mass effect or extra-axial collection. No acute or chronic hemorrhage. There is multifocal hyperintense T2-weighted signal within the white matter. Parenchymal volume and CSF spaces are normal. The midline structures are normal. Vascular: Major flow voids are preserved. Skull and upper cervical spine: Normal calvarium and skull base. Visualized upper cervical spine and soft tissues are normal. Sinuses/Orbits:No paranasal sinus fluid levels or advanced mucosal thickening. No mastoid or middle ear effusion. Normal orbits. IMPRESSION: 1. No acute intracranial abnormality. 2. Findings of chronic small vessel ischemia. Electronically Signed   By: Deatra Robinson M.D.   On: 10/01/2022 23:48   EEG adult  Result Date: 10/01/2022 Charlsie Quest, MD     10/01/2022  9:35 PM Patient Name: Christina Perkins MRN: [id number] Epilepsy Attending: Charlsie Quest Referring Physician/Provider: Gordy Councilman, MD Date: 10/01/2022 Duration: 25.13 mins Patient history: 81yo F with ams. EEG to evaluate for seizure Level of alertness: Awake AEDs during EEG study: None Technical aspects: This EEG study was done with scalp electrodes positioned according to the 10-20 International system of electrode placement. Lobbyist  activity was reviewed with band pass filter of 1-70Hz , sensitivity of 7 uV/mm, display speed of 38mm/sec with a  notched filter applied as appropriate. EEG data were recorded continuously and digitally stored.  Video monitoring was available and reviewed as appropriate. Description: No clear posterior dominant rhythm was seen. EEG showed continuous generalized 5 to 6 Hz theta admixed with intermittent generalized 2-3Hz  delta slowing. Hyperventilation and photic stimulation were not performed.   ABNORMALITY - Continuous slow, generalized IMPRESSION: This study is  suggestive of moderate diffuse encephalopathy, nonspecific etiology. No seizures or epileptiform discharges were seen throughout the recording. Charlsie Quest   CT Head Wo Contrast  Result Date: 10/01/2022 CLINICAL DATA:  Mental status change. EXAM: CT HEAD WITHOUT CONTRAST TECHNIQUE: Contiguous axial images were obtained from the base of the skull through the vertex without intravenous contrast. RADIATION DOSE REDUCTION: This exam was performed according to the departmental dose-optimization program which includes automated exposure control, adjustment of the mA and/or kV according to patient size and/or use of iterative reconstruction technique. COMPARISON:  CT examination dated August 26, 2022 FINDINGS: Brain: No evidence of acute infarction, hemorrhage, hydrocephalus, extra-axial collection or mass lesion/mass effect. Mild cerebral atrophy and chronic microvascular ischemic changes of the periventricular white matter, unchanged. Vascular: No hyperdense vessel or unexpected calcification. Skull: Normal. Negative for fracture or focal lesion. Sinuses/Orbits: No acute finding. Other: None. IMPRESSION: 1. No acute intracranial abnormality. 2. Mild cerebral atrophy and chronic microvascular ischemic changes of the periventricular white matter, unchanged. Electronically Signed   By: Larose Hires D.O.   On: 10/01/2022 15:50   DG Hip Unilat W or Wo Pelvis 2-3 Views Right  Result Date: 10/01/2022 CLINICAL DATA:  Pain status post fall. EXAM: DG HIP (WITH OR WITHOUT PELVIS) 2-3V RIGHT COMPARISON:  MRI pelvis and left hip 08/26/2022, pelvis and left hip radiographs 08/26/2022 FINDINGS: Mildly decreased bone mineralization. Mild-to-moderate bilateral femoroacetabular joint space narrowing. Moderate to severe right and moderate left femoral head-neck junction degenerative osteophytes. Anterior superior right femoroacetabular loose bodies measuring up to approximately 2.5 cm, similar to prior MRI and radiographs.  No acute fracture is seen.  No dislocation. Vascular phleboliths overlie the pelvis. Mild atherosclerotic calcifications. IMPRESSION: 1. No acute fracture. 2. Moderate to severe right and moderate left femoroacetabular osteoarthritis. Electronically Signed   By: Neita Garnet M.D.   On: 10/01/2022 15:27   DG Foot Complete Left  Result Date: 10/01/2022 CLINICAL DATA:  Pain status post fall. EXAM: LEFT FOOT - COMPLETE 3+ VIEW COMPARISON:  Left ankle radiographs 08/26/2022, left foot radiographs 03/29/2022 FINDINGS: Mild-to-moderate hallux valgus, unchanged. Mild lateral greater than medial great toe metatarsophalangeal joint space narrowing and peripheral osteophytosis. Tiny plantar and posterior calcaneal heel spurs. No acute fracture or dislocation. There is diffuse decreased bone mineralization. Resolution of the prior diffuse ankle and foot soft tissue swelling. IMPRESSION: Mild-to-moderate hallux valgus and mild great toe metatarsophalangeal joint osteoarthritis, unchanged. Resolution of the prior diffuse soft tissue swelling. Electronically Signed   By: Neita Garnet M.D.   On: 10/01/2022 15:24   DG Chest Port 1 View  Result Date: 10/01/2022 CLINICAL DATA:  Fever EXAM: PORTABLE CHEST 1 VIEW COMPARISON:  08/26/2022 FINDINGS: Cardiac size is within normal limits. There are no signs of pulmonary edema or focal pulmonary consolidation. There is no pleural effusion or pneumothorax. There is asymmetric pleural density in the left apex with no significant change. Surgical staples are seen in the left apex. There is small calcification between acromion and right humeral head suggesting  possible calcific tendinosis. Degenerative changes are noted in both AC joints, more so on the left side. IMPRESSION: There are no signs of pulmonary edema or focal pulmonary consolidation. Other findings as described in the body of the report. Electronically Signed   By: Ernie Avena M.D.   On: 10/01/2022 13:18       Subjective: No significant events overnight as discussed with staff, she denies any complaints.  Discharge Exam: Vitals:   10/02/22 2352 10/03/22 0721  BP: 107/60 132/68  Pulse: 68 83  Resp: 12 17  Temp: 98 F (36.7 C) 98.1 F (36.7 C)  SpO2: 99% 100%   Vitals:   10/02/22 1939 10/02/22 2000 10/02/22 2352 10/03/22 0721  BP: (!) 113/53  107/60 132/68  Pulse:   68 83  Resp: 18 20 12 17   Temp: 99.9 F (37.7 C)  98 F (36.7 C) 98.1 F (36.7 C)  TempSrc: Oral  Oral Oral  SpO2: 100%  99% 100%  Weight:      Height:        General: Pt is alert, awake, oriented x1, extremely frail, deconditioned and chronically ill-appearing, demented Cardiovascular: RRR, S1/S2 +, no rubs, no gallops Respiratory: CTA bilaterally, no wheezing, no rhonchi Abdominal: Soft, NT, ND, bowel sounds + Extremities: no edema, no cyanosis    The results of significant diagnostics from this hospitalization (including imaging, microbiology, ancillary and laboratory) are listed below for reference.     Microbiology: Recent Results (from the past 240 hour(s))  Resp Panel by RT-PCR (Flu A&B, Covid) Anterior Nasal Swab     Status: None   Collection Time: 10/01/22  1:04 PM   Specimen: Anterior Nasal Swab  Result Value Ref Range Status   SARS Coronavirus 2 by RT PCR NEGATIVE NEGATIVE Final    Comment: (NOTE) SARS-CoV-2 target nucleic acids are NOT DETECTED.  The SARS-CoV-2 RNA is generally detectable in upper respiratory specimens during the acute phase of infection. The lowest concentration of SARS-CoV-2 viral copies this assay can detect is 138 copies/mL. A negative result does not preclude SARS-Cov-2 infection and should not be used as the sole basis for treatment or other patient management decisions. A negative result may occur with  improper specimen collection/handling, submission of specimen other than nasopharyngeal swab, presence of viral mutation(s) within the areas targeted by this  assay, and inadequate number of viral copies(<138 copies/mL). A negative result must be combined with clinical observations, patient history, and epidemiological information. The expected result is Negative.  Fact Sheet for Patients:  10/03/22  Fact Sheet for Healthcare Providers:  BloggerCourse.com  This test is no t yet approved or cleared by the [place] FDA and  has been authorized for detection and/or diagnosis of SARS-CoV-2 by FDA under an Emergency Use Authorization (EUA). This EUA will remain  in effect (meaning this test can be used) for the duration of the COVID-19 declaration under Section 564(b)(1) of the Act, 21 U.S.C.section 360bbb-3(b)(1), unless the authorization is terminated  or revoked sooner.       Influenza A by PCR NEGATIVE NEGATIVE Final   Influenza B by PCR NEGATIVE NEGATIVE Final    Comment: (NOTE) The Xpert Xpress SARS-CoV-2/FLU/RSV plus assay is intended as an aid in the diagnosis of influenza from Nasopharyngeal swab specimens and should not be used as a sole basis for treatment. Nasal washings and aspirates are unacceptable for Xpert Xpress SARS-CoV-2/FLU/RSV testing.  Fact Sheet for Patients: [place]  Fact Sheet for Healthcare Providers: BloggerCourse.com  This test is  not yet approved or cleared by the [place] and has been authorized for detection and/or diagnosis of SARS-CoV-2 by FDA under an Emergency Use Authorization (EUA). This EUA will remain in effect (meaning this test can be used) for the duration of the COVID-19 declaration under Section 564(b)(1) of the Act, 21 U.S.C. section 360bbb-3(b)(1), unless the authorization is terminated or revoked.  Performed at [facility], [address] [place], [place] [address]   Culture, blood (routine x 2)     Status: None (Preliminary result)   Collection  Time: 10/01/22  2:41 PM   Specimen: BLOOD LEFT HAND  Result Value Ref Range Status   Specimen Description BLOOD LEFT HAND  Final   Special Requests   Final    BOTTLES DRAWN AEROBIC AND ANAEROBIC Blood Culture results may not be optimal due to an inadequate volume of blood received in culture bottles   Culture   Final    NO GROWTH 2 DAYS Performed at [facility], [address], [place], [place] [address]    Report Status PENDING  Incomplete  Urine Culture     Status: Abnormal   Collection Time: 10/01/22  4:20 PM   Specimen: Urine, Catheterized  Result Value Ref Range Status   Specimen Description URINE, CATHETERIZED  Final   Special Requests   Final    NONE Performed at [facility], [address] [place], [place] [address]    Culture >=100,000 COLONIES/mL ESCHERICHIA COLI (A)  Final   Report Status 10/03/2022 FINAL  Final   Organism ID, Bacteria ESCHERICHIA COLI (A)  Final      Susceptibility   Escherichia coli - MIC*    AMPICILLIN <=2 SENSITIVE Sensitive     CEFAZOLIN <=4 SENSITIVE Sensitive     CEFEPIME <=0.12 SENSITIVE Sensitive     CEFTRIAXONE <=0.25 SENSITIVE Sensitive     CIPROFLOXACIN <=0.25 SENSITIVE Sensitive     GENTAMICIN <=1 SENSITIVE Sensitive     IMIPENEM <=0.25 SENSITIVE Sensitive     NITROFURANTOIN <=16 SENSITIVE Sensitive     TRIMETH/SULFA <=20 SENSITIVE Sensitive     AMPICILLIN/SULBACTAM <=2 SENSITIVE Sensitive     PIP/TAZO <=4 SENSITIVE Sensitive     * >=100,000 COLONIES/mL ESCHERICHIA COLI     Labs: BNP (last 3 results) Recent Labs    03/29/22 1259  BNP 30.4   Basic Metabolic Panel: Recent Labs  Lab 10/01/22 1456 10/01/22 1927 10/01/22 2010 10/02/22 0135  NA 141 142  --  142  K 3.7 3.1*  --  3.5  CL 102 110  --  109  CO2  --  20*  --  22  GLUCOSE 83 76  --  96  BUN 17 14  --  14  CREATININE 0.90 0.68  --  0.76  CALCIUM  --  7.7*  --  8.7*  MG  --   --  1.5*  --    Liver Function Tests: Recent Labs  Lab 10/01/22 1927   AST 15  ALT 7  ALKPHOS 59  BILITOT 0.8  PROT 5.0*  ALBUMIN 2.4*   No results for input(s): "LIPASE", "AMYLASE" in the last 168 hours. No results for input(s): "AMMONIA" in the last 168 hours. CBC: Recent Labs  Lab 10/01/22 1441 10/01/22 1456  WBC 7.3  --   NEUTROABS 4.7  --   HGB 12.9 15.0  HCT 39.0 44.0  MCV 92.2  --   PLT 256  --    Cardiac  Enzymes: Recent Labs  Lab 10/01/22 2132  CKTOTAL 72   BNP: Invalid input(s): "POCBNP" CBG: Recent Labs  Lab 10/01/22 1833  GLUCAP 79   D-Dimer No results for input(s): "DDIMER" in the last 72 hours. Hgb A1c No results for input(s): "HGBA1C" in the last 72 hours. Lipid Profile No results for input(s): "CHOL", "HDL", "LDLCALC", "TRIG", "CHOLHDL", "LDLDIRECT" in the last 72 hours. Thyroid function studies No results for input(s): "TSH", "T4TOTAL", "T3FREE", "THYROIDAB" in the last 72 hours.  Invalid input(s): "FREET3" Anemia work up No results for input(s): "VITAMINB12", "FOLATE", "FERRITIN", "TIBC", "IRON", "RETICCTPCT" in the last 72 hours. Urinalysis    Component Value Date/Time   COLORURINE YELLOW 10/01/2022 1625   APPEARANCEUR HAZY (A) 10/01/2022 1625   LABSPEC 1.016 10/01/2022 1625   PHURINE 5.0 10/01/2022 1625   GLUCOSEU NEGATIVE 10/01/2022 1625   HGBUR SMALL (A) 10/01/2022 1625   BILIRUBINUR NEGATIVE 10/01/2022 1625   BILIRUBINUR negative 11/06/2021 1150   KETONESUR 5 (A) 10/01/2022 1625   PROTEINUR 30 (A) 10/01/2022 1625   UROBILINOGEN negative (A) 11/06/2021 1150   NITRITE POSITIVE (A) 10/01/2022 1625   LEUKOCYTESUR MODERATE (A) 10/01/2022 1625   Sepsis Labs Recent Labs  Lab 10/01/22 1441  WBC 7.3   Microbiology Recent Results (from the past 240 hour(s))  Resp Panel by RT-PCR (Flu A&B, Covid) Anterior Nasal Swab     Status: None   Collection Time: 10/01/22  1:04 PM   Specimen: Anterior Nasal Swab  Result Value Ref Range Status   SARS Coronavirus 2 by RT PCR NEGATIVE NEGATIVE Final    Comment:  (NOTE) SARS-CoV-2 target nucleic acids are NOT DETECTED.  The SARS-CoV-2 RNA is generally detectable in upper respiratory specimens during the acute phase of infection. The lowest concentration of SARS-CoV-2 viral copies this assay can detect is 138 copies/mL. A negative result does not preclude SARS-Cov-2 infection and should not be used as the sole basis for treatment or other patient management decisions. A negative result may occur with  improper specimen collection/handling, submission of specimen other than nasopharyngeal swab, presence of viral mutation(s) within the areas targeted by this assay, and inadequate number of viral copies(<138 copies/mL). A negative result must be combined with clinical observations, patient history, and epidemiological information. The expected result is Negative.  Fact Sheet for Patients:  BloggerCourse.com  Fact Sheet for Healthcare Providers:  SeriousBroker.it  This test is no t yet approved or cleared by the [place] FDA and  has been authorized for detection and/or diagnosis of SARS-CoV-2 by FDA under an Emergency Use Authorization (EUA). This EUA will remain  in effect (meaning this test can be used) for the duration of the COVID-19 declaration under Section 564(b)(1) of the Act, 21 U.S.C.section 360bbb-3(b)(1), unless the authorization is terminated  or revoked sooner.       Influenza A by PCR NEGATIVE NEGATIVE Final   Influenza B by PCR NEGATIVE NEGATIVE Final    Comment: (NOTE) The Xpert Xpress SARS-CoV-2/FLU/RSV plus assay is intended as an aid in the diagnosis of influenza from Nasopharyngeal swab specimens and should not be used as a sole basis for treatment. Nasal washings and aspirates are unacceptable for Xpert Xpress SARS-CoV-2/FLU/RSV testing.  Fact Sheet for Patients: BloggerCourse.com  Fact Sheet for Healthcare  Providers: SeriousBroker.it  This test is not yet approved or cleared by the [place] FDA and has been authorized for detection and/or diagnosis of SARS-CoV-2 by FDA under an Emergency Use Authorization (EUA). This EUA will remain in effect (meaning  this test can be used) for the duration of the COVID-19 declaration under Section 564(b)(1) of the Act, 21 U.S.C. section 360bbb-3(b)(1), unless the authorization is terminated or revoked.  Performed at [facility], [address], [place], [place] [address]   Culture, blood (routine x 2)     Status: None (Preliminary result)   Collection Time: 10/01/22  2:41 PM   Specimen: BLOOD LEFT HAND  Result Value Ref Range Status   Specimen Description BLOOD LEFT HAND  Final   Special Requests   Final    BOTTLES DRAWN AEROBIC AND ANAEROBIC Blood Culture results may not be optimal due to an inadequate volume of blood received in culture bottles   Culture   Final    NO GROWTH 2 DAYS Performed at [facility], [address], [place], [place] [address]    Report Status PENDING  Incomplete  Urine Culture     Status: Abnormal   Collection Time: 10/01/22  4:20 PM   Specimen: Urine, Catheterized  Result Value Ref Range Status   Specimen Description URINE, CATHETERIZED  Final   Special Requests   Final    NONE Performed at [facility], [address], [place], [place] [address]    Culture >=100,000 COLONIES/mL ESCHERICHIA COLI (A)  Final   Report Status 10/03/2022 FINAL  Final   Organism ID, Bacteria ESCHERICHIA COLI (A)  Final      Susceptibility   Escherichia coli - MIC*    AMPICILLIN <=2 SENSITIVE Sensitive     CEFAZOLIN <=4 SENSITIVE Sensitive     CEFEPIME <=0.12 SENSITIVE Sensitive     CEFTRIAXONE <=0.25 SENSITIVE Sensitive     CIPROFLOXACIN <=0.25 SENSITIVE Sensitive     GENTAMICIN <=1 SENSITIVE Sensitive     IMIPENEM <=0.25 SENSITIVE Sensitive     NITROFURANTOIN <=16 SENSITIVE Sensitive      TRIMETH/SULFA <=20 SENSITIVE Sensitive     AMPICILLIN/SULBACTAM <=2 SENSITIVE Sensitive     PIP/TAZO <=4 SENSITIVE Sensitive     * >=100,000 COLONIES/mL ESCHERICHIA COLI     Time coordinating discharge: Over 30 minutes  SIGNED:   Huey Bienenstock, MD  [facility] 10/03/2022, 10:59 AM Pager   If 7PM-7AM, please contact night-coverage [email or web address]

## 2022-10-06 LAB — CULTURE, BLOOD (ROUTINE X 2): Culture: NO GROWTH

## 2022-10-08 ENCOUNTER — Ambulatory Visit: Payer: Self-pay

## 2022-10-08 NOTE — Patient Outreach (Signed)
  Care Coordination   Initial Visit Note   10/08/2022 Name: Christina Perkins MRN: [id number] DOB: 06-16-1941  Christina Perkins is a 81 y.o. year old female who sees Ngetich, Donalee Citrin, NP for primary care. I spoke with daughter Christina Perkins by phone today.  What matters to the patients health and wellness today?  Patient is now under the care of Hospice.     Goals Addressed             This Visit's Progress    COMPLETED: RN Care Coordination Activities: no further follow up needed       Care Coordination Interventions: Evaluation of current treatment plan related to Seizure like activity and patient's adherence to plan as established by provider Placed successful outbound call to daughter Christina Perkins, discussed recent IP event and patient's discharge home under the care of Hospice Determined Hospice services are in place for patient at this time Discussed with daughter, no further RN CC follow up is needed at this time  Sent in basket message to SW Bevelyn Ngo            SDOH assessments and interventions completed:  No     Care Coordination Interventions:  Yes, provided   Follow up plan: No further intervention required.   Encounter Outcome:  Pt. Visit Completed

## 2022-10-08 NOTE — Patient Instructions (Addendum)
Visit Information  Thank you for taking time to visit with me today. Please don't hesitate to contact me if I can be of assistance to you.   Following are the goals we discussed today:   Goals Addressed             This Visit's Progress    COMPLETED: RN Care Coordination Activities: no further follow up needed       Care Coordination Interventions: Evaluation of current treatment plan related to Seizure like activity and patient's adherence to plan as established by provider Placed successful outbound call to daughter Prescott Parma, discussed recent IP event and patient's discharge home under the care of Hospice Determined Hospice services are in place for patient at this time Discussed with daughter, no further RN CC follow up is needed at this time  Sent in basket message to SW Bevelyn Ngo           If you are experiencing a Mental Health or Behavioral Health Crisis or need someone to talk to, please call 1-800-273-TALK (toll free, 24 hour hotline)  Patient verbalizes understanding of instructions and care plan provided today and agrees to view in [place]. Active MyChart status and patient understanding of how to access instructions and care plan via MyChart confirmed with patient.     Delsa Sale, RN, BSN, CCM Care Management Coordinator [facility] Care Management Direct Phone: [phone]

## 2022-10-13 ENCOUNTER — Ambulatory Visit: Payer: Self-pay

## 2022-10-13 NOTE — Patient Outreach (Signed)
  Care Coordination   Discipline Closure  Visit Note   10/13/2022 Name: Christina Perkins MRN: [id number] DOB: [birth year]  Christina Perkins is a 81 y.o. year old female who sees Ngetich, Donalee Citrin, NP for primary care. I  performed a discipline closure considering patient is now under the care of hospice  What matters to the patients health and wellness today?      Goals Addressed             This Visit's Progress    COMPLETED: Care Coordination Activities       Care Coordination Interventions: Collaboration with RN Care Manager who indicates patient is now under the care of Hospice services SW performed discipline closure         SDOH assessments and interventions completed:  No     Care Coordination Interventions:  No, not indicated   Follow up plan: No further intervention required.   Encounter Outcome:  Pt. Visit Completed   Bevelyn Ngo, BSW, CDP Social Worker, Certified Dementia Practitioner [facility] Care Management  Care Coordination [phone]

## 2022-11-04 ENCOUNTER — Ambulatory Visit: Payer: Medicare Other | Admitting: Family

## 2022-11-05 ENCOUNTER — Ambulatory Visit: Payer: Medicare Other | Admitting: Family

## 2022-11-17 DEATH — deceased

## 2022-11-19 ENCOUNTER — Other Ambulatory Visit (HOSPITAL_COMMUNITY): Payer: Self-pay

## 2022-12-12 ENCOUNTER — Other Ambulatory Visit: Payer: Self-pay | Admitting: Family

## 2023-03-02 ENCOUNTER — Encounter: Payer: Self-pay | Admitting: *Deleted
